# Patient Record
Sex: Female | Born: 1935
Health system: Southern US, Community
[De-identification: ages and names within clinical notes are randomized; demographics above are authoritative.]

## PROBLEM LIST (undated history)

## (undated) DIAGNOSIS — M1990 Unspecified osteoarthritis, unspecified site: Secondary | ICD-10-CM

## (undated) DIAGNOSIS — Z889 Allergy status to unspecified drugs, medicaments and biological substances status: Secondary | ICD-10-CM

## (undated) DIAGNOSIS — Z8719 Personal history of other diseases of the digestive system: Secondary | ICD-10-CM

## (undated) DIAGNOSIS — E049 Nontoxic goiter, unspecified: Secondary | ICD-10-CM

## (undated) DIAGNOSIS — K589 Irritable bowel syndrome without diarrhea: Secondary | ICD-10-CM

## (undated) HISTORY — PX: COLONOSCOPY W/ POLYPECTOMY: SHX1380

## (undated) HISTORY — PX: LEFT OOPHORECTOMY: SHX1961

## (undated) HISTORY — PX: ABDOMINAL HYSTERECTOMY: SHX81

---

## 1999-12-02 ENCOUNTER — Encounter: Payer: Self-pay | Admitting: Family Medicine

## 1999-12-02 ENCOUNTER — Emergency Department (HOSPITAL_COMMUNITY): Admission: EM | Admit: 1999-12-02 | Discharge: 1999-12-03 | Payer: Self-pay | Admitting: Emergency Medicine

## 2000-01-25 ENCOUNTER — Encounter: Payer: Self-pay | Admitting: Internal Medicine

## 2000-01-25 ENCOUNTER — Encounter: Admission: RE | Admit: 2000-01-25 | Discharge: 2000-01-25 | Payer: Self-pay | Admitting: Internal Medicine

## 2002-03-31 ENCOUNTER — Encounter: Payer: Self-pay | Admitting: Internal Medicine

## 2002-03-31 ENCOUNTER — Encounter: Admission: RE | Admit: 2002-03-31 | Discharge: 2002-03-31 | Payer: Self-pay | Admitting: Internal Medicine

## 2002-11-10 ENCOUNTER — Other Ambulatory Visit: Admission: RE | Admit: 2002-11-10 | Discharge: 2002-11-10 | Payer: Self-pay | Admitting: *Deleted

## 2003-07-10 ENCOUNTER — Ambulatory Visit (HOSPITAL_COMMUNITY): Admission: RE | Admit: 2003-07-10 | Discharge: 2003-07-10 | Payer: Self-pay | Admitting: Gastroenterology

## 2007-12-28 ENCOUNTER — Emergency Department (HOSPITAL_COMMUNITY): Admission: EM | Admit: 2007-12-28 | Discharge: 2007-12-28 | Payer: Self-pay | Admitting: Emergency Medicine

## 2008-06-05 ENCOUNTER — Encounter: Admission: RE | Admit: 2008-06-05 | Discharge: 2008-06-05 | Payer: Self-pay | Admitting: Internal Medicine

## 2008-06-15 ENCOUNTER — Other Ambulatory Visit: Admission: RE | Admit: 2008-06-15 | Discharge: 2008-06-15 | Payer: Self-pay | Admitting: Obstetrics and Gynecology

## 2008-07-28 ENCOUNTER — Encounter: Admission: RE | Admit: 2008-07-28 | Discharge: 2008-07-28 | Payer: Self-pay | Admitting: Obstetrics and Gynecology

## 2009-07-13 ENCOUNTER — Other Ambulatory Visit: Admission: RE | Admit: 2009-07-13 | Discharge: 2009-07-13 | Payer: Self-pay | Admitting: Obstetrics and Gynecology

## 2009-08-24 ENCOUNTER — Encounter: Admission: RE | Admit: 2009-08-24 | Discharge: 2009-08-24 | Payer: Self-pay | Admitting: Obstetrics and Gynecology

## 2010-05-30 ENCOUNTER — Emergency Department (HOSPITAL_COMMUNITY): Admission: EM | Admit: 2010-05-30 | Discharge: 2010-05-30 | Payer: Self-pay | Admitting: Emergency Medicine

## 2010-10-05 ENCOUNTER — Encounter: Admission: RE | Admit: 2010-10-05 | Discharge: 2010-10-05 | Payer: Self-pay | Admitting: Internal Medicine

## 2011-03-23 ENCOUNTER — Other Ambulatory Visit: Payer: Self-pay | Admitting: Internal Medicine

## 2011-03-23 ENCOUNTER — Ambulatory Visit
Admission: RE | Admit: 2011-03-23 | Discharge: 2011-03-23 | Disposition: A | Discharge status: Home / Self Care | Payer: Commercial Managed Care - PPO | Source: Ambulatory Visit | Attending: Internal Medicine | Admitting: Internal Medicine

## 2011-03-23 DIAGNOSIS — E041 Nontoxic single thyroid nodule: Secondary | ICD-10-CM

## 2011-10-16 ENCOUNTER — Other Ambulatory Visit: Payer: Self-pay | Admitting: Internal Medicine

## 2011-10-16 DIAGNOSIS — Z1231 Encounter for screening mammogram for malignant neoplasm of breast: Secondary | ICD-10-CM

## 2011-11-17 ENCOUNTER — Ambulatory Visit
Admission: RE | Admit: 2011-11-17 | Discharge: 2011-11-17 | Disposition: A | Discharge status: Home / Self Care | Payer: Commercial Managed Care - PPO | Source: Ambulatory Visit | Attending: Internal Medicine | Admitting: Internal Medicine

## 2011-11-17 DIAGNOSIS — Z1231 Encounter for screening mammogram for malignant neoplasm of breast: Secondary | ICD-10-CM

## 2012-05-07 DIAGNOSIS — E042 Nontoxic multinodular goiter: Secondary | ICD-10-CM | POA: Diagnosis not present

## 2012-05-09 DIAGNOSIS — E041 Nontoxic single thyroid nodule: Secondary | ICD-10-CM | POA: Diagnosis not present

## 2012-05-09 DIAGNOSIS — E042 Nontoxic multinodular goiter: Secondary | ICD-10-CM | POA: Diagnosis not present

## 2012-05-10 ENCOUNTER — Other Ambulatory Visit: Payer: Self-pay | Admitting: Endocrinology

## 2012-05-10 DIAGNOSIS — E049 Nontoxic goiter, unspecified: Secondary | ICD-10-CM

## 2012-05-21 ENCOUNTER — Ambulatory Visit
Admission: RE | Admit: 2012-05-21 | Discharge: 2012-05-21 | Disposition: A | Discharge status: Home / Self Care | Payer: 59 | Source: Ambulatory Visit | Attending: Endocrinology | Admitting: Endocrinology

## 2012-05-21 DIAGNOSIS — E049 Nontoxic goiter, unspecified: Secondary | ICD-10-CM

## 2012-06-04 ENCOUNTER — Ambulatory Visit (HOSPITAL_COMMUNITY)
Admission: RE | Admit: 2012-06-04 | Discharge: 2012-06-04 | Disposition: A | Discharge status: Home / Self Care | Payer: 59 | Source: Ambulatory Visit | Attending: Internal Medicine | Admitting: Internal Medicine

## 2012-06-04 DIAGNOSIS — M79609 Pain in unspecified limb: Secondary | ICD-10-CM | POA: Insufficient documentation

## 2012-06-04 DIAGNOSIS — R262 Difficulty in walking, not elsewhere classified: Secondary | ICD-10-CM | POA: Diagnosis not present

## 2012-06-04 DIAGNOSIS — IMO0001 Reserved for inherently not codable concepts without codable children: Secondary | ICD-10-CM | POA: Diagnosis not present

## 2012-06-04 DIAGNOSIS — R29898 Other symptoms and signs involving the musculoskeletal system: Secondary | ICD-10-CM | POA: Insufficient documentation

## 2012-06-04 DIAGNOSIS — M6281 Muscle weakness (generalized): Secondary | ICD-10-CM | POA: Insufficient documentation

## 2012-06-04 NOTE — Evaluation (Signed)
Physical Therapy Evaluation  Patient Details  Name: EILENE VOIGT MRN: [id number] Date of Birth: 1935-09-21  Today's Date: 06/04/2012 Time: 4098-1191 PT Time Calculation (min): 45 min  Visit#: 1  of 8   Re-eval: 07/04/12 Assessment Diagnosis: radicular back pain Prior Therapy: none  Authorization:  [employer] plus medicare  Authorization Time Period:    Authorization Visit#:  1 of  10   Past Medical History: No past medical history on file. Past Surgical History: No past surgical history on file.  Subjective Symptoms/Limitations Symptoms: Ms Starn states that she has been experiencing pain in her right which radiates down into her calf.  The patient states that the pain gets so bad that when it starts she has to pick her leg up and put it into the car.  She states that she can walk once she started but she is unable to go up or down steps.  The patient states the is intermittent and she does not know what triggers it.  She states that when she has an episode it might last three weeks without going away.  She is now on a mm relaxor and pain meds. How long can you sit comfortably?: When the pain occurs it does not matter if she is sitting, standing or lying she has the pain.  She just deals with the pain she will not sit any longer than an hour. How long can you stand comfortably?: The patient states that she just bares the pain. How long can you walk comfortably?: The patient states that she is only able to walk for 5-10 minutes at a time. Special Tests: Pt states she is having difficulty going up and down steps as well as in and out of a car. Patient Stated Goals: To have less pain and be able to move. Pain Assessment Currently in Pain?: No/denies (worst pain 10/10.  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 Leisure: Hobbies-no  Cognition/Observation Cognition Overall Cognitive Status: Appears within functional limits for tasks assessed  Sensation/Coordination/Flexibility/Functional Tests Functional Tests Functional Tests: Oswestry 60%  Assessment RLE Strength Right Hip Flexion: 2/5 Right Hip Extension: 3-/5 Right Hip ABduction: 5/5 Right Hip ADduction: 4/5 Right Knee Flexion: 3+/5 Right Knee Extension: 5/5 Right Ankle Dorsiflexion: 5/5 LLE Strength Left Hip Flexion: 5/5 Left Hip Extension: 3/5 Left Hip ABduction: 5/5 Left Hip ADduction: 4/5 Left Knee Flexion: 5/5 Left Knee Extension: 5/5 Left Ankle Dorsiflexion: 5/5 Lumbar AROM Lumbar Flexion: decreased lumbar motion but full Range due to hip (increased pain upon coming up.) Lumbar Extension: wnl- pt can feel groin when extended  Lumbar - Right Side Bend: increased groin pain  Lumbar - Left Side Bend: no groin pain. Lumbar - Right Rotation: wfl Lumbar - Left Rotation: wfl  Exercise/Treatments    Standing Other Standing Lumbar Exercises: L SB x 10 Seated Other Seated Lumbar Exercises: Kegal/transverse ab x 10 each    Physical Therapy Assessment and Plan PT Assessment and Plan Clinical Impression Statement: Pt with decreased strength and increase pain of R LE who will benefit from skilled PT to returne PT to previous functional level and improve quality of life. Pt will benefit from skilled  therapeutic intervention in order to improve on the following deficits: Pain;Impaired perceived functional ability;Decreased strength;Difficulty walking;Decreased activity tolerance Rehab Potential: Good PT Frequency: Min 2X/week PT Duration: 4 weeks PT Treatment/Interventions: Therapeutic activities;Therapeutic  exercise;Modalities PT Plan: Pt to be seen to improve core and LE strength as well as improve flexiblity of lumbar area.  Pt will begin stab ex with bent leg lift, bridge, clam and isometric hip flextion as well as begin pelvic tilt to impove lumbar mobility and ab strength.     Goals Home Exercise Program Pt will Perform Home Exercise Program: Independently PT Short Term Goals Time to Complete Short Term Goals: 2 weeks PT Short Term Goal 1: Pt pain to be no greater than a 6 PT Short Term Goal 2: Pt strength to be improved by 1/2 grade PT Short Term Goal 3: Pt to have no difficulty puttting leg into car on her own. PT Short Term Goal 4: Pt to states she can walk for an hour at a time without difficulty PT Long Term Goals Time to Complete Long Term Goals: 4 weeks PT Long Term Goal 1: I in advance HEP PT Long Term Goal 2: Pt strength to be increased by one grade Long Term Goal 3: Pt to be able to go up and down steps reciprocally Long Term Goal 4: Pt to state that she does not experience pain greater than a 3 in her groin/leg area. PT Long Term Goal 5: Pt to be able to sit for two hours for traveling, charting or going to the movies  Problem List Patient Active Problem List  Diagnosis  . Weakness of right leg  . Difficulty in walking    PT - End of Session Activity Tolerance: Patient tolerated treatment well General Behavior During Session: [facility] for tasks performed Cognition: [facility] for tasks performed PT Plan of Care PT Home Exercise Plan: given Consulted and Agree with Plan of Care: Patient  GP Functional Assessment Tool Used: Oswestry Functional Limitation: Mobility: Walking and moving around Mobility: Walking and Moving Around Current Status (Z6109): At least 60 percent but less than 80 percent impaired, limited or restricted Mobility: Walking and Moving Around Goal Status [phone]): At least 1 percent but less than 20 percent impaired, limited or  restricted  RUSSELL,CINDY 06/04/2012, 10:33 AM  Physician Documentation Your signature is required to indicate approval of the treatment plan as stated above.  Please sign and either send electronically or make a copy of this report for your files and return this physician signed original.   Please mark one 1.__approve of plan  2. ___approve of plan with the following conditions.   ______________________________                                                          _____________________ Physician Signature  Date  

## 2012-06-11 ENCOUNTER — Ambulatory Visit (HOSPITAL_COMMUNITY)
Admission: RE | Admit: 2012-06-11 | Discharge: 2012-06-11 | Disposition: A | Discharge status: Home / Self Care | Payer: 59 | Source: Ambulatory Visit | Attending: Internal Medicine | Admitting: Internal Medicine

## 2012-06-11 DIAGNOSIS — M6281 Muscle weakness (generalized): Secondary | ICD-10-CM | POA: Diagnosis not present

## 2012-06-11 DIAGNOSIS — IMO0001 Reserved for inherently not codable concepts without codable children: Secondary | ICD-10-CM | POA: Diagnosis not present

## 2012-06-11 DIAGNOSIS — R262 Difficulty in walking, not elsewhere classified: Secondary | ICD-10-CM | POA: Diagnosis not present

## 2012-06-11 DIAGNOSIS — M79609 Pain in unspecified limb: Secondary | ICD-10-CM | POA: Diagnosis not present

## 2012-06-11 NOTE — Progress Notes (Signed)
Physical Therapy Treatment Patient Details  Name: Danielle Oconnell MRN: [id number] Date of Birth: [birth year]  Today's Date: 06/11/2012 Time: 4098-1191 PT Time Calculation (min): 31 min 30' of therex  Visit#: 2  of 8   Re-eval:      Authorization:    Authorization Time Period:    Authorization Visit#:   of     Subjective: Symptoms/Limitations Symptoms: Pt reports 5/10 R groin area pain that radiates down anterior RLE to calf. No back pain today only "tired" feeling.  Precautions/Restrictions     Exercise/Treatments         Stretches Hip Flexor Stretch: 3 reps;30 seconds;Limitations (right)    Standing Other Standing Lumbar Exercises: L SB x 10 Other Standing Lumbar Exercises: R knee flex x10 hold 3" Seated Other Seated Lumbar Exercises: Kegal/transverse ab x 10 each Other Seated Lumbar Exercises: seated marching 1' w/ab set Supine Ab Set: 10 reps Bent Knee Raise: 5 reps (bilateral) Bridge: 10 reps Isometric Hip Flexion: 5 reps;5 seconds Other Supine Lumbar Exercises: posterior pelvic tilt Sidelying Clam: 10 reps;Limitations (bilateral) Prone  Straight Leg Raise: 10 reps (right)        Physical Therapy Assessment and Plan PT Assessment and Plan Clinical Impression Statement: Patient did well with added exercises; did require tactile cueing for some. Continue POC focusing on core stabilization and strengthening of RLE    Goals    Problem List Patient Active Problem List  Diagnosis  . Weakness of right leg  . Difficulty in walking    PT - End of Session Activity Tolerance: Patient tolerated treatment well General Behavior During Session: [facility] for tasks performed Cognition: [facility] for tasks performed  GP    Danielle Oconnell 06/11/2012, 8:39 AM

## 2012-06-13 ENCOUNTER — Inpatient Hospital Stay (HOSPITAL_COMMUNITY): Admission: RE | Admit: 2012-06-13 | Payer: 59 | Source: Ambulatory Visit | Admitting: Physical Therapy

## 2012-06-18 ENCOUNTER — Ambulatory Visit (HOSPITAL_COMMUNITY)
Admission: RE | Admit: 2012-06-18 | Discharge: 2012-06-18 | Disposition: A | Discharge status: Home / Self Care | Payer: 59 | Source: Ambulatory Visit | Attending: Internal Medicine | Admitting: Internal Medicine

## 2012-06-18 DIAGNOSIS — IMO0001 Reserved for inherently not codable concepts without codable children: Secondary | ICD-10-CM | POA: Diagnosis not present

## 2012-06-18 DIAGNOSIS — M6281 Muscle weakness (generalized): Secondary | ICD-10-CM | POA: Diagnosis not present

## 2012-06-18 DIAGNOSIS — M79609 Pain in unspecified limb: Secondary | ICD-10-CM | POA: Diagnosis not present

## 2012-06-18 DIAGNOSIS — R262 Difficulty in walking, not elsewhere classified: Secondary | ICD-10-CM | POA: Diagnosis not present

## 2012-06-18 NOTE — Progress Notes (Signed)
Physical Therapy Treatment Patient Details  Name: Danielle Oconnell MRN: [id number] Date of Birth: December 27, 1934  Today's Date: 06/18/2012 Time: 4098-1191 PT Time Calculation (min): 39 min  Visit#: 3  of 8   Re-eval: 07/04/12 Charges: Therex x 38'   Subjective: Symptoms/Limitations Symptoms: Pt reports 5/10 radicualr sx in R hip and groin. Pain Assessment Currently in Pain?: Yes Pain Score:   5 Pain Location: Hip Pain Orientation: Right   Exercise/Treatments Stretches Active Hamstring Stretch: 2 reps;30 seconds Lower Trunk Rotation: 5 reps;10 seconds Standing Other Standing Lumbar Exercises: L SB x 10 Supine Ab Set: 10 reps;5 seconds Bent Knee Raise: 10 reps Bridge: 10 reps Isometric Hip Flexion: 10 reps;5 seconds Other Supine Lumbar Exercises: Pelvic floor contraction 10x5" Sidelying Clam: 5 reps;Limitations (10") Prone  Straight Leg Raise: 10 reps Other Prone Lumbar Exercises: Glute set 10x5"  Physical Therapy Assessment and Plan PT Assessment and Plan Clinical Impression Statement: Pt completes therex well after cueing for technique. Began new stretches to improve lumbar and LE flexibility. Pt has most difficulty with hip extensor exercises secondary to weakness. Pt reports no change in pain at end of session. PT Plan: Continue to progress core stability and flexibility per PT POC.     Problem List Patient Active Problem List  Diagnosis  . Weakness of right leg  . Difficulty in walking    PT - End of Session Activity Tolerance: Patient tolerated treatment well General Behavior During Session: [facility] for tasks performed Cognition: [facility] for tasks performed   Seth Bake, PTA 06/18/2012, 9:02 AM

## 2012-06-20 ENCOUNTER — Ambulatory Visit (HOSPITAL_COMMUNITY)
Admission: RE | Admit: 2012-06-20 | Discharge: 2012-06-20 | Disposition: A | Discharge status: Home / Self Care | Payer: 59 | Source: Ambulatory Visit | Attending: Internal Medicine | Admitting: Internal Medicine

## 2012-06-20 DIAGNOSIS — M79609 Pain in unspecified limb: Secondary | ICD-10-CM | POA: Diagnosis not present

## 2012-06-20 DIAGNOSIS — M6281 Muscle weakness (generalized): Secondary | ICD-10-CM | POA: Insufficient documentation

## 2012-06-20 DIAGNOSIS — IMO0001 Reserved for inherently not codable concepts without codable children: Secondary | ICD-10-CM | POA: Insufficient documentation

## 2012-06-20 DIAGNOSIS — R262 Difficulty in walking, not elsewhere classified: Secondary | ICD-10-CM | POA: Diagnosis not present

## 2012-06-20 NOTE — Progress Notes (Signed)
Physical Therapy Treatment Patient Details  Name: Danielle Oconnell MRN: [id number] Date of Birth: 1935/04/01  Today's Date: 06/20/2012 Time: 0865-7846 PT Time Calculation (min): 35 min Visit#: 4  of 8   Re-eval: 07/04/12 Charges:  therex 32'    Subjective: Symptoms/Limitations Symptoms: Pt. states she is having less pain today, 3/10, mostly into R hip/groin area.  Just got off from working 3rd shift, 8 hours. Pain Assessment Currently in Pain?: Yes Pain Score:   3 Pain Location: Hip Pain Orientation: Right   Exercise/Treatments Stretches Active Hamstring Stretch: 2 reps;30 seconds Standing Other Standing Lumbar Exercises: L SB x 10 Supine Ab Set: 15 reps;5 seconds Bent Knee Raise: 10 reps Bridge: 10 reps Straight Leg Raise: 5 reps Isometric Hip Flexion: 10 reps;5 seconds Other Supine Lumbar Exercises: Pelvic floor contraction 10x10" Sidelying Clam: 5 reps;Limitations Clam Limitations: 10 sec holds Prone  Straight Leg Raise: 10 reps Other Prone Lumbar Exercises: Glute set 10x5"    Physical Therapy Assessment and Plan PT Assessment and Plan Clinical Impression Statement: Noted weakness in R LE with supine and prone SLRs and bridge with decreased ROM achieved.  Able to increase hold time of kegals to 10 seconds.  Pt. with good stability while performing exercises. PT Plan: Continue to progress core strength and flexibility.     Problem List Patient Active Problem List  Diagnosis  . Weakness of right leg  . Difficulty in walking    PT - End of Session Activity Tolerance: Patient tolerated treatment well General Behavior During Session: [facility] for tasks performed Cognition: [facility] for tasks performed   Lurena Nida, PTA/CLT 06/20/2012, 8:59 AM

## 2012-06-25 ENCOUNTER — Ambulatory Visit (HOSPITAL_COMMUNITY)
Admission: RE | Admit: 2012-06-25 | Discharge: 2012-06-25 | Disposition: A | Discharge status: Home / Self Care | Payer: 59 | Source: Ambulatory Visit | Attending: Internal Medicine | Admitting: Internal Medicine

## 2012-06-25 DIAGNOSIS — M6281 Muscle weakness (generalized): Secondary | ICD-10-CM | POA: Diagnosis not present

## 2012-06-25 DIAGNOSIS — M79609 Pain in unspecified limb: Secondary | ICD-10-CM | POA: Diagnosis not present

## 2012-06-25 DIAGNOSIS — IMO0001 Reserved for inherently not codable concepts without codable children: Secondary | ICD-10-CM | POA: Diagnosis not present

## 2012-06-25 DIAGNOSIS — R262 Difficulty in walking, not elsewhere classified: Secondary | ICD-10-CM | POA: Diagnosis not present

## 2012-06-25 NOTE — Progress Notes (Signed)
Physical Therapy Treatment Patient Details  Name: Danielle Oconnell MRN: [id number] Date of Birth: May 18, 1935  Today's Date: 06/25/2012 Time: 0803-0840 PT Time Calculation (min): 37 min 36' therex   Visit#: 5  of 8   Re-eval: 07/04/12    Authorization:    Authorization Time Period:    Authorization Visit#:   of     Subjective: Symptoms/Limitations Symptoms: Patient reports a "tightness" to R hip/groin area today, "not really pain"  Precautions/Restrictions     Exercise/Treatments         Stretches Active Hamstring Stretch: 3 reps;30 seconds;Other (comment) Lower Trunk Rotation: 5 reps;10 seconds Hip Flexor Stretch: 3 reps;30 seconds;Limitations    Standing Other Standing Lumbar Exercises: L SB x 15 Seated   Supine Ab Set: 15 reps;5 seconds Bent Knee Raise: 10 reps Bridge: 15 reps Straight Leg Raise: 10 reps Isometric Hip Flexion: 10 reps;5 seconds Other Supine Lumbar Exercises: Pelvic floor contraction 10x10" Other Supine Lumbar Exercises: isometric adduction sqeeze w/ball x10 for 5" Sidelying Clam: 5 reps;Limitations Clam Limitations: 10 sec holds Hip Abduction: 10 reps;Other (comment) (bilateral) Prone  Single Arm Raise: 10 reps Straight Leg Raise: 10 reps Other Prone Lumbar Exercises: Glute set 10x5" Quadruped       Physical Therapy Assessment and Plan PT Assessment and Plan Clinical Impression Statement: Patient performs most exercises without difficulty; however prone LLE today was weaker than normal in prone. Continue to strengthen abdominals and hip adduction, Continue with POC;add bal sqeeeze to bridges and prone opposite arm/leg next  PT Plan: Continue with PT POC    Goals    Problem List Patient Active Problem List  Diagnosis  . Weakness of right leg  . Difficulty in walking    PT - End of Session Activity Tolerance: Patient tolerated treatment well General Behavior During Session: [facility] for tasks performed Cognition: [facility] for tasks  performed  GP    Cherron Blitzer ATKINSO 06/25/2012, 8:45 AM

## 2012-06-27 ENCOUNTER — Ambulatory Visit (HOSPITAL_COMMUNITY)
Admission: RE | Admit: 2012-06-27 | Discharge: 2012-06-27 | Disposition: A | Discharge status: Home / Self Care | Payer: 59 | Source: Ambulatory Visit | Attending: Internal Medicine | Admitting: Internal Medicine

## 2012-06-27 DIAGNOSIS — M79609 Pain in unspecified limb: Secondary | ICD-10-CM | POA: Diagnosis not present

## 2012-06-27 DIAGNOSIS — IMO0001 Reserved for inherently not codable concepts without codable children: Secondary | ICD-10-CM | POA: Diagnosis not present

## 2012-06-27 DIAGNOSIS — M6281 Muscle weakness (generalized): Secondary | ICD-10-CM | POA: Diagnosis not present

## 2012-06-27 DIAGNOSIS — R262 Difficulty in walking, not elsewhere classified: Secondary | ICD-10-CM | POA: Diagnosis not present

## 2012-06-27 NOTE — Progress Notes (Signed)
Physical Therapy Treatment Patient Details  Name: Danielle Oconnell MRN: [id number] Date of Birth: Jul 05, 1935  Today's Date: 06/27/2012 Time: 8295-6213 PT Time Calculation (min): 41 min  Visit#: 6  of 10   Re-eval: 07/04/12    Subjective: Symptoms/Limitations Symptoms: Pt has not started a walking program but she is doing her exercises. Pain Assessment Currently in Pain?: Yes Pain Score:   3 Pain Location: Calf Pain Orientation: Right    Exercise/Treatments   Stretches Active Hamstring Stretch: 3 reps;30 seconds Single Knee to Chest Stretch: 3 reps;30 seconds Lower Trunk Rotation: 5 reps Aerobic Tread Mill: 1.6 mph x 6'    Standing Scapular Retraction: Strengthening;10 reps;Theraband Row: Strengthening;10 reps;Theraband Theraband Level (Row): Level 3 (Green) Shoulder Extension: Strengthening;Right;10 reps Seated   Supine Dead Bug: 10 reps Bridge: 15 reps;Limitations Bridge Limitations: with ball squeeze. Straight Leg Raise: 10 reps Sidelying Hip Abduction: 10 reps Prone  Straight Leg Raise: 10 reps Opposite Arm/Leg Raise: 10 reps Other Prone Lumbar Exercises: heel squeeze x 10     Physical Therapy Assessment and Plan PT Assessment and Plan Clinical Impression Statement: Pt needs both verbal and physical cuing for proper stabilization.  Pt  added T-band, ball bridges, increased slr to floating slr and opposite arm/leg raise.  Encouraged walking program for healthy lifesyle PT Plan: Work with stabilization technique so pt can perform exercises correctly at home.      Problem List Patient Active Problem List  Diagnosis  . Weakness of right leg  . Difficulty in walking       RUSSELL,CINDY 06/27/2012, 8:46 AM

## 2012-07-02 ENCOUNTER — Ambulatory Visit (HOSPITAL_COMMUNITY): Payer: 59 | Admitting: *Deleted

## 2012-07-04 ENCOUNTER — Ambulatory Visit (HOSPITAL_COMMUNITY): Payer: 59 | Admitting: Physical Therapy

## 2012-07-09 ENCOUNTER — Ambulatory Visit (HOSPITAL_COMMUNITY)
Admission: RE | Admit: 2012-07-09 | Discharge: 2012-07-09 | Disposition: A | Discharge status: Home / Self Care | Payer: 59 | Source: Ambulatory Visit | Attending: Internal Medicine | Admitting: Internal Medicine

## 2012-07-09 DIAGNOSIS — M6281 Muscle weakness (generalized): Secondary | ICD-10-CM | POA: Diagnosis not present

## 2012-07-09 DIAGNOSIS — R262 Difficulty in walking, not elsewhere classified: Secondary | ICD-10-CM | POA: Diagnosis not present

## 2012-07-09 DIAGNOSIS — M79609 Pain in unspecified limb: Secondary | ICD-10-CM | POA: Diagnosis not present

## 2012-07-09 DIAGNOSIS — IMO0001 Reserved for inherently not codable concepts without codable children: Secondary | ICD-10-CM | POA: Diagnosis not present

## 2012-07-09 NOTE — Progress Notes (Signed)
Physical Therapy Treatment Patient Details  Name: Danielle Oconnell MRN: [id number] Date of Birth: [birth year]  Today's Date: 07/09/2012 Time: 0800-0844 PT Time Calculation (min): 44 min Visit#: 7  of 8   Re-eval: 07/11/12 Charges:  therex 38'    Subjective: Symptoms/Limitations Symptoms: Pt. states she has not started a walking program yet secondary to being on vacation last week. Reports she drove to [place] and did not have any discomfort in her LE's, just a little pressure (not pain) in her LB.  Currently, states her pain is in her central LB and is more like a pressure than a pain. No pain into her LE's. Pain Assessment Currently in Pain?: No/denies   Exercise/Treatments Stretches Active Hamstring Stretch: 3 reps;30 seconds Single Knee to Chest Stretch: 3 reps;30 seconds Lower Trunk Rotation: 5 reps Aerobic Tread Mill: 1.6 mph x 6' Standing Scapular Retraction: 10 reps;Theraband Theraband Level (Scapular Retraction): Level 3 (Green) Row: 10 reps;Theraband Theraband Level (Row): Level 3 (Green) Shoulder Extension: 10 reps;Theraband Theraband Level (Shoulder Extension): Level 3 (Green) Other Standing Lumbar Exercises: L SB x 15 Supine Dead Bug: 10 reps Bridge: Limitations;10 reps Bridge Limitations: feet on big ball Straight Leg Raise: 10 reps Sidelying Hip Abduction: 10 reps Prone  Single Arm Raise: 10 reps;Limitations Single Arm Raises Limitations: alternating Straight Leg Raise: 10 reps;Limitations Straight Leg Raises Limitations: alternating Opposite Arm/Leg Raise: 10 reps Other Prone Lumbar Exercises: heel squeeze x 10     Physical Therapy Assessment and Plan PT Assessment and Plan Clinical Impression Statement: Overall, centralizing pain with improvement. Pt. continued to get cramps into her L hamstring during therex today but able to subside with stretching. Continues to display poor stabilization with therex, requiring verbal and tactile cuing.  Progressed  bridge with feet on physioball.     Problem List Patient Active Problem List  Diagnosis  . Weakness of right leg  . Difficulty in walking    PT - End of Session Activity Tolerance: Patient tolerated treatment well General Behavior During Session: [facility] for tasks performed Cognition: [facility] for tasks performed PT Plan of Care PT Home Exercise Plan: Re-evaluate next visit.   Lurena Nida, PTA/CLT 07/09/2012, 8:49 AM

## 2012-07-11 ENCOUNTER — Ambulatory Visit (HOSPITAL_COMMUNITY)
Admission: RE | Admit: 2012-07-11 | Discharge: 2012-07-11 | Disposition: A | Discharge status: Home / Self Care | Payer: 59 | Source: Ambulatory Visit | Attending: Internal Medicine | Admitting: Internal Medicine

## 2012-07-11 DIAGNOSIS — M79609 Pain in unspecified limb: Secondary | ICD-10-CM | POA: Diagnosis not present

## 2012-07-11 DIAGNOSIS — R29898 Other symptoms and signs involving the musculoskeletal system: Secondary | ICD-10-CM

## 2012-07-11 DIAGNOSIS — IMO0001 Reserved for inherently not codable concepts without codable children: Secondary | ICD-10-CM | POA: Diagnosis not present

## 2012-07-11 DIAGNOSIS — M6281 Muscle weakness (generalized): Secondary | ICD-10-CM | POA: Diagnosis not present

## 2012-07-11 DIAGNOSIS — R262 Difficulty in walking, not elsewhere classified: Secondary | ICD-10-CM

## 2012-07-11 NOTE — Evaluation (Signed)
Physical Therapy Evaluation  Patient Details  Name: Danielle Oconnell MRN: [id number] Date of Birth: May 19, 1935  Today's Date: 07/11/2012 Time: 4696-2952 PT Time Calculation (min): 43 min  Visit#: 8  of 12   Re-eval: 07/11/12 Assessment Diagnosis: radicular back pain Prior Therapy: none  Authorization: medicare secondary;  Change G code to CI at 10th visit based on Oswestry score.  Authorization Time Period:    Authorization Visit#: 8  of 12    Past Medical History: No past medical history on file. Past Surgical History: No past surgical history on file.  Subjective Symptoms/Limitations Symptoms: Pt states that the pain in her leg is much better.  She can still tell the leg is weak.  The patient has been doing her HEP How long can you sit comfortably?: The patient states that she is able to sit comfortable for an hour.  This has not changed. How long can you stand comfortably?: The patient states that she is much more comfortable standing now.  Able to stand for 30 min.  Prior to therapy pt just bared the pain. How long can you walk comfortably?: The patient is able to walk for 30 minutes. She was able to walk 5 to 10 minutes. Special Tests: The patient states that she is doing steps much better but knows she still has weakness. 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 Leisure:  Hobbies-no  Cognition/Observation Cognition Overall Cognitive Status: Appears within functional limits for tasks assessed  Sensation/Coordination/Flexibility/Functional Tests Functional Tests Functional Tests: Pennie Rushing was 62  Assessment RLE Strength Right Hip Flexion: 3+/5 (was 2/5) Right Hip Extension: 3-/5 (was 3-/5) Right Hip ABduction: 5/5 Right Hip ADduction: 5/5 Right Knee Flexion: 4/5 (was 3+/5) Right Knee Extension: 5/5 Right Ankle Dorsiflexion: 5/5 LLE Strength Left Hip Flexion: 5/5 Left Hip Extension: 3/5 (was 3/5) Left Hip ABduction: 5/5 Left Hip ADduction: 5/5 Left Knee Flexion: 5/5 Left Knee Extension: 5/5 Left Ankle Dorsiflexion: 5/5 Lumbar AROM Lumbar Flexion:  (improved lumbar motion wfl) Lumbar Extension: wfl Lumbar - Right Side Bend: wfl Lumbar - Left Side Bend: wfl Lumbar - Right Rotation: wfl Lumbar - Left Rotation: wfl   There ex:  T-band x 15 reps with HEP   Physical Therapy Assessment and Plan PT Assessment and Plan Clinical Impression Statement: Pt improved significantly with Oswestry going from 60 to 13 but still has noted weakness and pain.  will continue stab and stretches to maximize pt functional improvement. Rehab Potential: Good PT Frequency: Min 1X/week PT Duration: 4 weeks PT Plan: Pt needs to focus on strengthening of R hip flex;B hip extensors and R hamstring;  Begin lateral and forward with good stabiliztion as well as bridge w/legs on physioball;  prone IR/ER; SLR, SAR opposite Arm/leg raise.      Goals Home Exercise Program PT Goal: Perform Home Exercise Program - Progress: Met PT Short Term Goals PT Short Term Goal 1 - Progress:  Met PT Short Term Goal 2: goal is for mm to be strengthened by 1/2 grade.  Met by all weakened mm except hip extension B PT Short Term Goal 2 - Progress: Partly met PT Short Term Goal 3: Goal was for pt to have no difficulty getting into car.  Better but there is still weakness PT Short Term Goal 3  - Progress: Progressing toward goal PT Short Term Goal 4: goal :  Pt to be able to walk for an hour PT Short Term Goal 4 - Progress: Met PT Long Term Goals PT Long Term Goal 1 - Progress: Met PT Long Term Goal 2 - Progress: Partly met Long Term Goal 3: Pt is able to go up and down steps reciprocally with hand holding onto rail and noted weakness in LE Long Term Goal 3 Progress: Progressing toward goal Long Term Goal 4: Goal was for groin pain to be no greater than a 3/10 current 4/10 Long Term Goal 4 Progress: Progressing toward goal PT Long Term Goal 5: Goal was to be able to sit for two hours for traveling;  Goal met Long Term Goal 5 Progress: Met  Problem List Patient Active Problem List  Diagnosis  . Weakness of right leg  . Difficulty in walking    PT - End of Session Activity Tolerance: Patient tolerated treatment well General Behavior During Session: [facility] for tasks performed Cognition: [facility] for tasks performed PT Plan of Care PT Home Exercise Plan: advance HEP given Consulted and Agree with Plan of Care: Patient  GP Functional Assessment Tool Used: Soundra Pilon 07/11/2012, 9:44 AM  Physician Documentation Your signature is required to indicate approval of the treatment plan as stated above.  Please sign and either send electronically or make a copy of this report for your files and return this physician signed original.   Please mark one 1.__approve of plan  2. ___approve of plan with the following conditions.   ______________________________                                                          _____________________ Physician Signature                                                                                                             Date

## 2012-07-16 ENCOUNTER — Ambulatory Visit (HOSPITAL_COMMUNITY)
Admission: RE | Admit: 2012-07-16 | Discharge: 2012-07-16 | Disposition: A | Discharge status: Home / Self Care | Payer: 59 | Source: Ambulatory Visit | Attending: Internal Medicine | Admitting: Internal Medicine

## 2012-07-16 DIAGNOSIS — IMO0001 Reserved for inherently not codable concepts without codable children: Secondary | ICD-10-CM | POA: Diagnosis not present

## 2012-07-16 DIAGNOSIS — M79609 Pain in unspecified limb: Secondary | ICD-10-CM | POA: Diagnosis not present

## 2012-07-16 DIAGNOSIS — M6281 Muscle weakness (generalized): Secondary | ICD-10-CM | POA: Diagnosis not present

## 2012-07-16 DIAGNOSIS — R262 Difficulty in walking, not elsewhere classified: Secondary | ICD-10-CM | POA: Diagnosis not present

## 2012-07-16 NOTE — Progress Notes (Signed)
Physical Therapy Treatment Patient Details  Name: Danielle Oconnell MRN: [id number] Date of Birth: Nov 26, 1934  Today's Date: 07/16/2012 Time: 4098-1191 PT Time Calculation (min): 39 min Visit#: 9  of 12   Re-eval: 07/25/12 Charges:  therex 38'   Subjective: Symptoms/Limitations Symptoms: Pt. states she is doing better overall.  Reports worked all night and no pain today. Pain Assessment Currently in Pain?: No/denies   Exercise/Treatments Stretches Active Hamstring Stretch: 3 reps;30 seconds Aerobic Tread Mill: 2.0 mph X 6' Standing Other Standing Lumbar Exercises: B hip ext. 10 reps, R hip flexion 10 reps 5" holds Other Standing Lumbar Exercises: lateral/fwd step ups B LE's 10 reps each with stab Supine Bridge: 10 reps Bridge Limitations: feet on physioball Straight Leg Raise: 10 reps;Limitations Straight Leg Raises Limitations: R only, slowly Isometric Hip Flexion: 10 reps;5 seconds Prone  Single Arm Raise: 10 reps;Limitations Single Arm Raises Limitations: alternating Straight Leg Raise: 10 reps;Limitations Straight Leg Raises Limitations: alternating Opposite Arm/Leg Raise: 10 reps Other Prone Lumbar Exercises: heel squeeze x 10 Other Prone Lumbar Exercises: R hamstring curl 10 reps 4#     Physical Therapy Assessment and Plan PT Assessment and Plan Clinical Impression Statement: Pt. required cues for stabilization with all exercises.  Added standing hip extension and R hip flexion without difficulty.  Pt. required tactile cues for stabilization with prone hamstring curls due to weakness.  Limited ROM with SLR due to weakness. PT Plan: Continue to focus on strengthening B hip extensors, R hip flex/hamstring.  Add prone hip IR/ER with stab next visit and update G-Code.  (medicare is secondary).   Problem List Patient Active Problem List  Diagnosis  . Weakness of right leg  . Difficulty in walking    PT - End of Session Activity Tolerance: Patient tolerated  treatment well General Behavior During Session: [facility] for tasks performed Cognition: [facility] for tasks performed   Lurena Nida, PTA/CLT 07/16/2012, 8:48 AM

## 2012-07-23 ENCOUNTER — Ambulatory Visit (HOSPITAL_COMMUNITY)
Admission: RE | Admit: 2012-07-23 | Discharge: 2012-07-23 | Disposition: A | Discharge status: Home / Self Care | Payer: 59 | Source: Ambulatory Visit | Attending: Internal Medicine | Admitting: Internal Medicine

## 2012-07-23 DIAGNOSIS — M79609 Pain in unspecified limb: Secondary | ICD-10-CM | POA: Insufficient documentation

## 2012-07-23 DIAGNOSIS — R262 Difficulty in walking, not elsewhere classified: Secondary | ICD-10-CM | POA: Insufficient documentation

## 2012-07-23 DIAGNOSIS — IMO0001 Reserved for inherently not codable concepts without codable children: Secondary | ICD-10-CM | POA: Insufficient documentation

## 2012-07-23 DIAGNOSIS — M6281 Muscle weakness (generalized): Secondary | ICD-10-CM | POA: Insufficient documentation

## 2012-07-23 DIAGNOSIS — R29898 Other symptoms and signs involving the musculoskeletal system: Secondary | ICD-10-CM

## 2012-07-23 NOTE — Progress Notes (Signed)
Physical Therapy Treatment Patient Details  Name: Danielle Oconnell MRN: [id number] Date of Birth: 1935/04/18  Today's Date: 07/23/2012 Time: 0802-0843 PT Time Calculation (min): 41 min  Visit#: 10  of 12   Re-eval: 07/25/12    Authorization:    Authorization Time Period:    Authorization Visit#: 10  of 12    Subjective: Symptoms/Limitations Symptoms: Pt states more difficulty with housework than work .  States she has pain in the medial aspect of L LE to L knee Pain Assessment Pain Score:   2    Exercise/Treatments Tread Mill: 2.0 x 6' [employer] for [employer] Lunge: 10 reps Side Lunge: 10 reps Other Standing Lumbar Exercises: step up x 10 on 6" step Supine Bridge: 10 reps Bridge Limitations: feet on ball Other Supine Lumbar Exercises: Ball ab squeeze lifting R LE then L x 10 Prone  Other Prone Lumbar Exercises: IR/ER x 10 @ Quadruped Straight Leg Raise: 5 reps     Physical Therapy Assessment and Plan PT Assessment and Plan Clinical Impression Statement: Pt added several new exercises to improve functional mobilty at home with housework.  Pt needed verbal and physcial cues to perfom ex correctly. Rehab Potential: Good PT Frequency: Min 1X/week PT Plan: two more visits reveiw new ex begin double ab set with ball; sitting stab on ball    Goals    Problem List Patient Active Problem List  Diagnosis  . Weakness of right leg  . Difficulty in walking    PT - End of Session Activity Tolerance: Patient tolerated treatment well General Behavior During Session: [facility] for tasks performed PT Plan of Care PT Home Exercise Plan: given new program  GP Functional Assessment Tool Used: Oswestry Functional Limitation: Mobility: Walking and moving around Mobility: Walking and Moving Around Current Status (W0981): At least 1 percent but less than 20 percent impaired, limited or restricted Mobility: Walking and Moving Around Goal Status [id number]): At  least 1 percent but less than 20 percent impaired, limited or restricted  Dennis Hegeman,CINDY 07/23/2012, 8:54 AM

## 2012-07-25 ENCOUNTER — Ambulatory Visit (HOSPITAL_COMMUNITY): Payer: 59 | Admitting: Physical Therapy

## 2012-07-30 ENCOUNTER — Ambulatory Visit (HOSPITAL_COMMUNITY)
Admission: RE | Admit: 2012-07-30 | Discharge: 2012-07-30 | Disposition: A | Discharge status: Home / Self Care | Payer: 59 | Source: Ambulatory Visit | Attending: Internal Medicine | Admitting: Internal Medicine

## 2012-07-30 DIAGNOSIS — R29898 Other symptoms and signs involving the musculoskeletal system: Secondary | ICD-10-CM

## 2012-07-30 DIAGNOSIS — R262 Difficulty in walking, not elsewhere classified: Secondary | ICD-10-CM

## 2012-07-30 NOTE — Evaluation (Cosign Needed)
Physical Therapy Evaluation  Patient Details  Name: NIJAE ATWAL MRN: [id number] Date of Birth: [birth year]  Today's Date: 07/30/2012 Time: 0802-0830 PT Time Calculation (min): 28 min  Visit#: 11  of 11   Re-eval:   Assessment Diagnosis: radicular back pain Prior Therapy: none  Authorization: medicare  Authorization Time Period:    Authorization Visit#: 11  of 11    Past Medical History: No past medical history on file. Past Surgical History: No past surgical history on file.  Subjective Symptoms/Limitations Symptoms: Ms. Werth states that this past wek has been good she has not had pain down her leg. How long can you sit comfortably?: The patient was able to sit through a movie now was only an hour.  How long can you stand comfortably?: Standing is alright now. How long can you walk comfortably?: The patient is walking four times a week for 30 minutes. Pain Assessment Currently in Pain?: No/denies Pain Score: 0-No pain     Prior Functioning  Home Living Lives With: Alone Type of Home: Apartment Home Access: Stairs to enter Prior Function Level of Independence: Independent with basic ADLs Able to Take Stairs?: Reciprically Driving: Yes Vocation: Full time employment Vocation Requirements: night RN Leisure: Hobbies-no  Cognition/Observation Cognition Overall Cognitive Status: Appears within functional limits for tasks assessed  Sensation/Coordination/Flexibility/Functional Tests Functional Tests Functional Tests: Oswestry was 16 on 8/22 now 5  Assessment RLE Strength Right Hip Flexion: 5/5 (was 3+/5 on 8/22) Right Hip Extension: 4/5 (was 3-/5) Right Hip ABduction: 5/5 Right Hip ADduction: 5/5 Right Knee Flexion: 5/5 (was 3+/5) Right Knee Extension: 5/5 Right Ankle Dorsiflexion: 5/5 LLE Strength Left Hip Flexion: 5/5 Left Hip Extension: 3+/5 (was 3/5) Left Hip ABduction: 5/5 Left Hip ADduction: 5/5 Left Knee Flexion: 5/5 Left Knee Extension:  5/5 Left Ankle Dorsiflexion: 5/5 Lumbar AROM Lumbar Flexion:  (improved lumbar motion wfl) Lumbar Extension: wfl Lumbar - Right Side Bend: wfl Lumbar - Left Side Bend: wfl Lumbar - Right Rotation: wfl Lumbar - Left Rotation: wfl  Exercise/Treatments Pt shown forearm plank exercise.  Spoke to pt about benefits of walking and the importance of keeping this up even though pain has gone away.     Physical Therapy Assessment and Plan PT Assessment and Plan Clinical Impression Statement: Pt has met all goals. Oswestry has came down from initial visit of a 60 to 10% percieved disability. PT Plan: D/C    Goals Home Exercise Program PT Goal: Perform Home Exercise Program - Progress: Met PT Short Term Goals PT Short Term Goal 1 - Progress: Met PT Short Term Goal 2 - Progress: Met PT Short Term Goal 3 - Progress: Met PT Short Term Goal 4 - Progress: Met PT Long Term Goals PT Long Term Goal 1 - Progress: Met PT Long Term Goal 2 - Progress: Met Long Term Goal 3 Progress: Met Long Term Goal 4 Progress: Met Long Term Goal 5 Progress: Met  Problem List Patient Active Problem List  Diagnosis  . Weakness of right leg  . Difficulty in walking    PT - End of Session Activity Tolerance: Patient tolerated treatment well General Behavior During Session: [facility] for tasks performed PT Plan of Care PT Home Exercise Plan: given new ex concentrating on hip extensors  GP Functional Assessment Tool Used: Oswestry now is at a 5/50 was 8 Functional Limitation: Mobility: Walking and moving around Mobility: Walking and Moving Around Goal Status [phone]): At least 1 percent but less than 20 percent impaired, limited or  restricted Mobility: Walking and Moving Around Discharge Status [phone]): At least 1 percent but less than 20 percent impaired, limited or restricted  RUSSELL,CINDY 07/30/2012, 8:34 AM  Physician Documentation Your signature is required to indicate approval of the treatment plan as  stated above.  Please sign and either send electronically or make a copy of this report for your files and return this physician signed original.   Please mark one 1.__approve of plan  2. ___approve of plan with the following conditions.   ______________________________                                                          _____________________ Physician Signature                                                                                                             Date

## 2012-08-22 DIAGNOSIS — L299 Pruritus, unspecified: Secondary | ICD-10-CM | POA: Diagnosis not present

## 2012-08-22 DIAGNOSIS — R209 Unspecified disturbances of skin sensation: Secondary | ICD-10-CM | POA: Diagnosis not present

## 2012-10-28 ENCOUNTER — Other Ambulatory Visit: Payer: Self-pay | Admitting: Internal Medicine

## 2012-10-28 DIAGNOSIS — Z1231 Encounter for screening mammogram for malignant neoplasm of breast: Secondary | ICD-10-CM

## 2012-11-18 ENCOUNTER — Ambulatory Visit
Admission: RE | Admit: 2012-11-18 | Discharge: 2012-11-18 | Disposition: A | Discharge status: Home / Self Care | Payer: 59 | Source: Ambulatory Visit | Attending: Internal Medicine | Admitting: Internal Medicine

## 2012-11-18 DIAGNOSIS — Z1231 Encounter for screening mammogram for malignant neoplasm of breast: Secondary | ICD-10-CM

## 2012-12-31 DIAGNOSIS — M1710 Unilateral primary osteoarthritis, unspecified knee: Secondary | ICD-10-CM | POA: Diagnosis not present

## 2012-12-31 DIAGNOSIS — IMO0002 Reserved for concepts with insufficient information to code with codable children: Secondary | ICD-10-CM | POA: Diagnosis not present

## 2013-01-28 DIAGNOSIS — IMO0002 Reserved for concepts with insufficient information to code with codable children: Secondary | ICD-10-CM | POA: Diagnosis not present

## 2013-01-28 DIAGNOSIS — M1710 Unilateral primary osteoarthritis, unspecified knee: Secondary | ICD-10-CM | POA: Diagnosis not present

## 2013-02-23 IMAGING — US US SOFT TISSUE HEAD/NECK
1 series · 14 of 25 positions shown · non-contrast
Comparison: Ultrasound of the thyroid of 03/23/2011

CLINICAL DATA: Follow up of thyroid goiter

THYROID ULTRASOUND
TECHNIQUE: Ultrasound examination of the thyroid gland and adjacent
soft tissues was performed.

[Series 1: us soft tissue head/neck · 0.04mm/px · 14 of 51 slices shown]
[im 1/51]
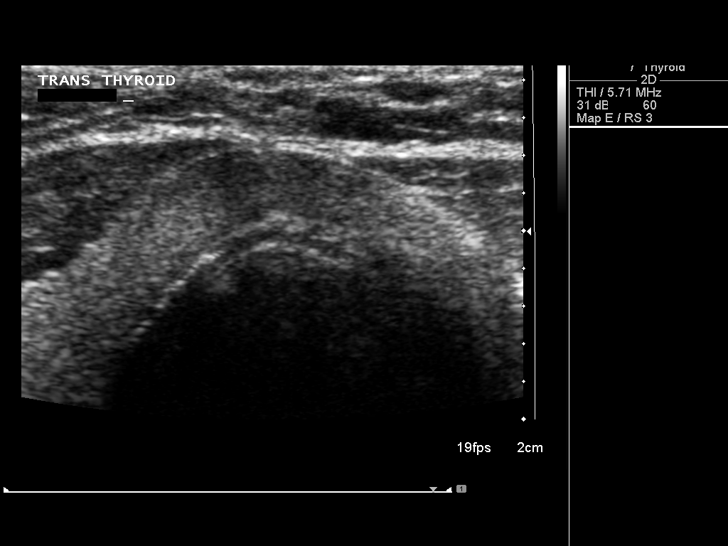
[im 5/51]
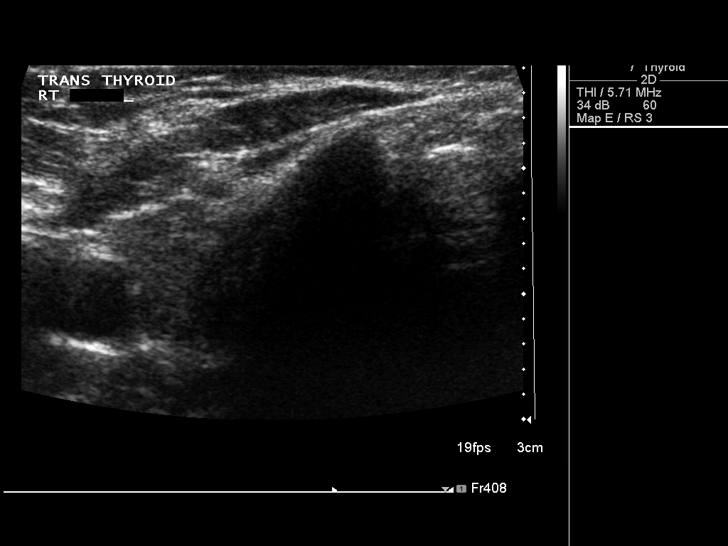
[im 9/51]
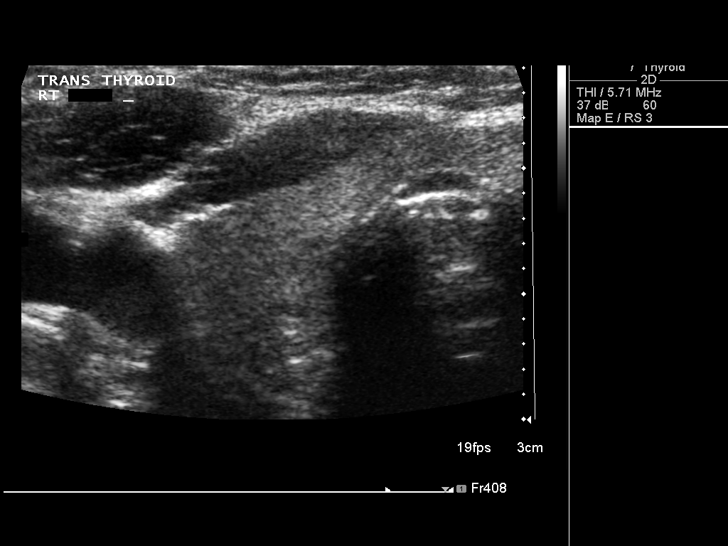
[im 13/51]
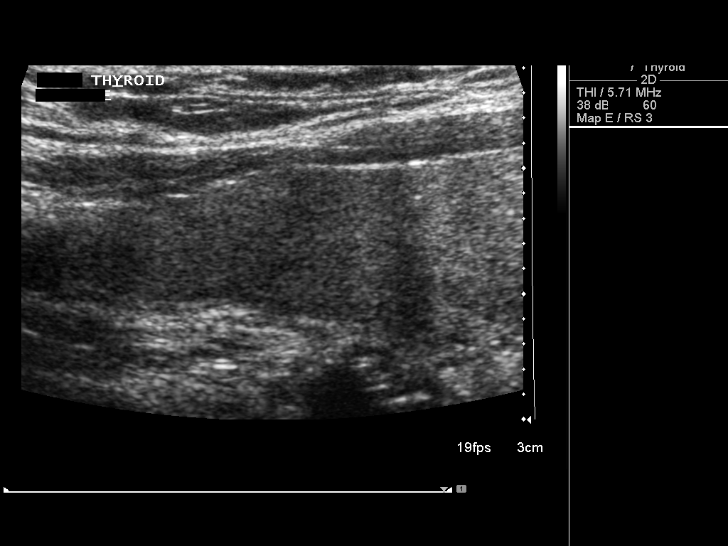
[im 17/51]
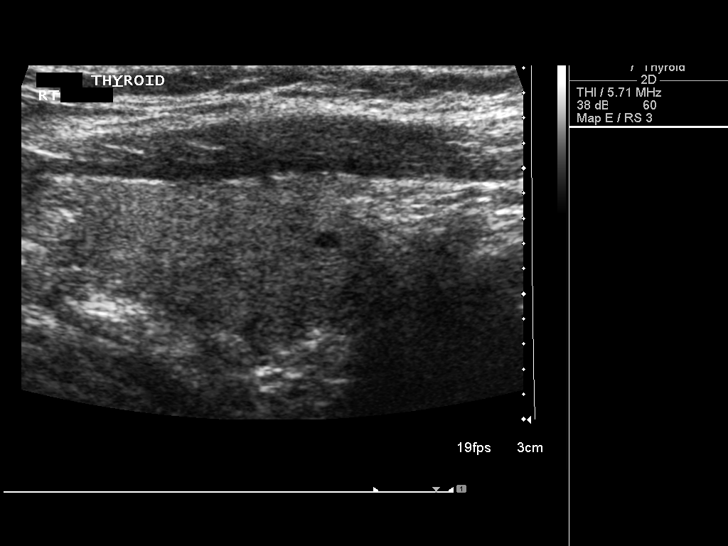
[im 19/51]
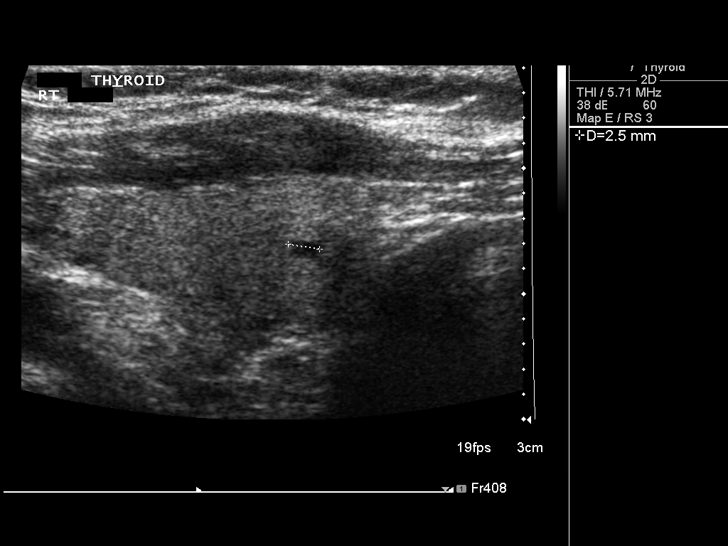
[im 23/51]
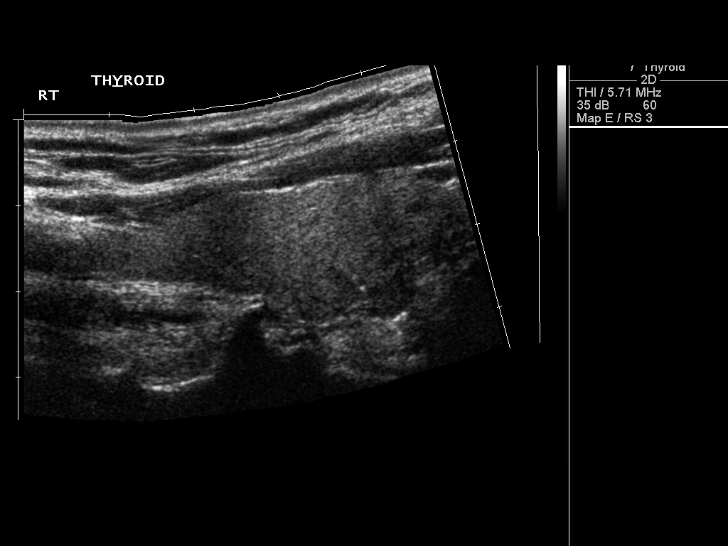
[im 28/51]
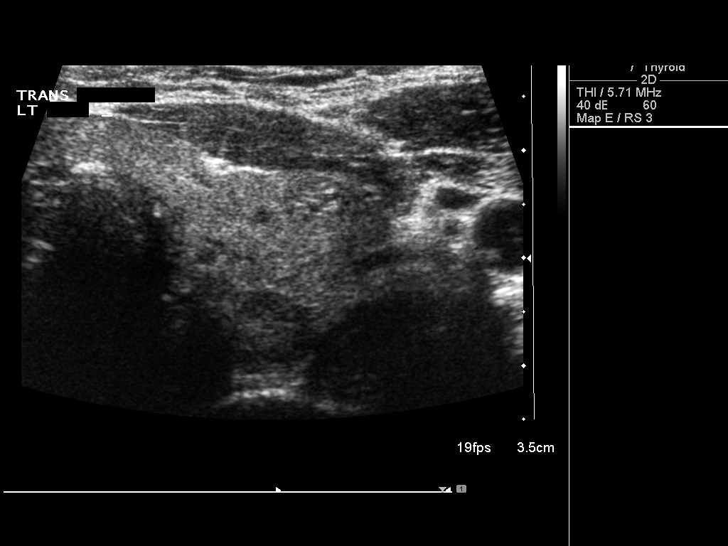
[im 32/51]
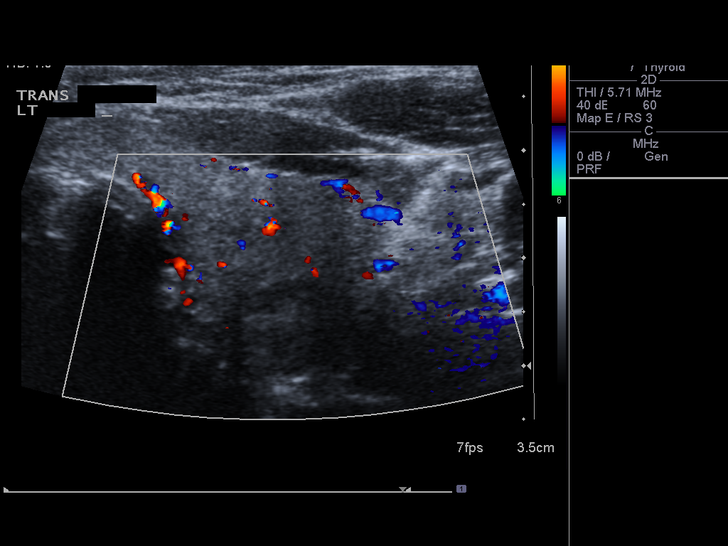
[im 34/51]
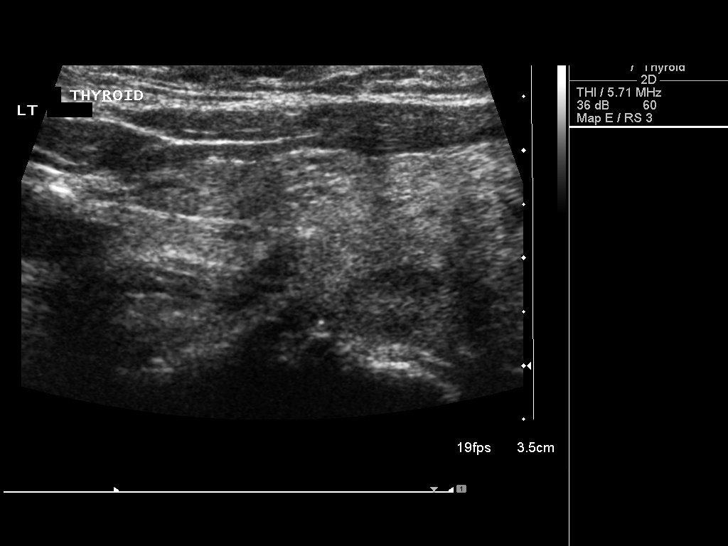
[im 38/51]
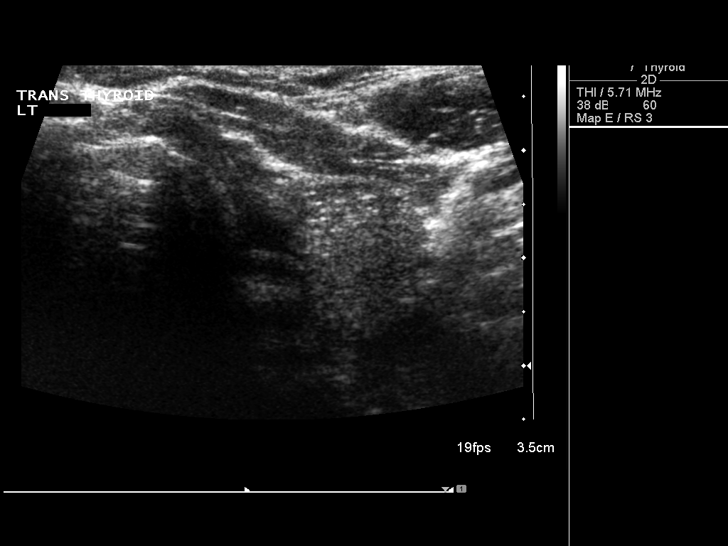
[im 42/51]
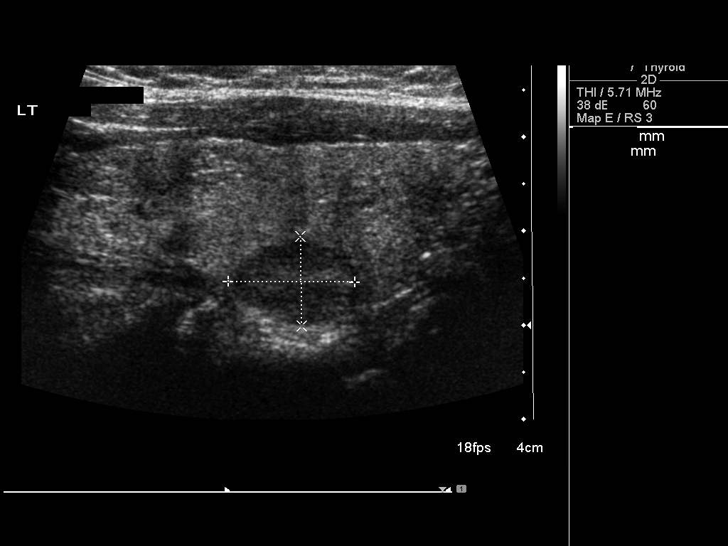
[im 46/51]
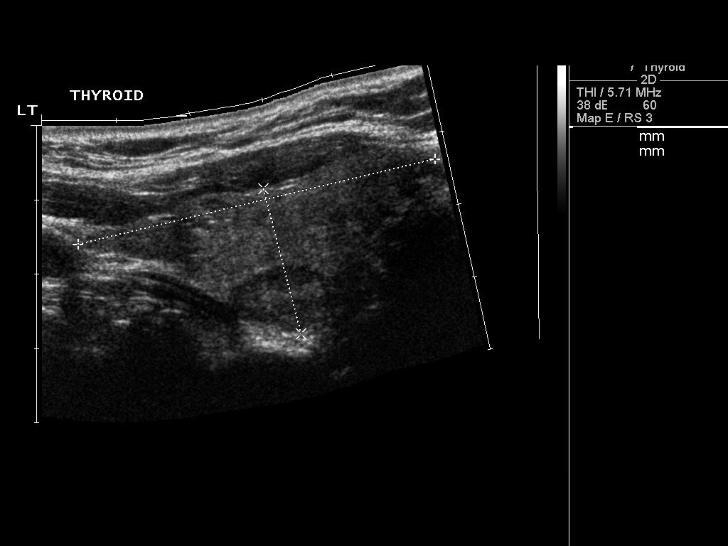
[im 51/51]
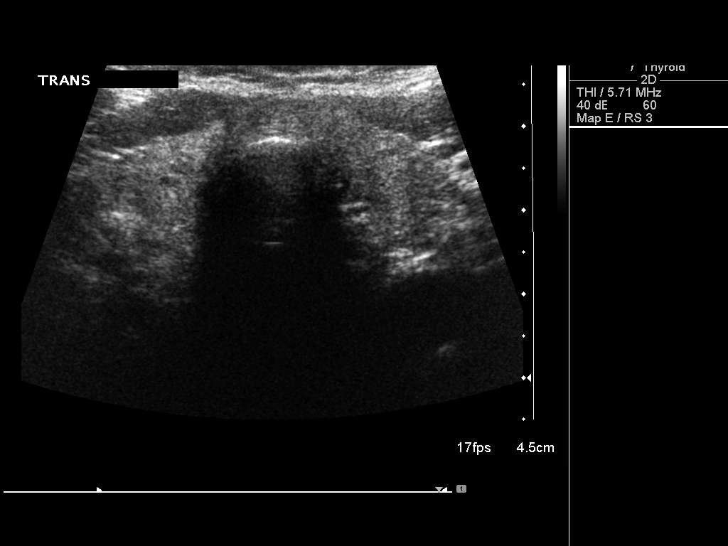

[14 of 25 positions shown; findings below may reference images not displayed]

FINDINGS: Right thyroid lobe:  5.8 x 1.7 x 1.4 cm.  (Previously 84.6 x 1.6 x
1.4 cm).
Left thyroid lobe:  5.0 x 2.0 x 2.1 cm.  (Previously 4.3 x 2.0 x
1.8 cm).
Isthmus:    4.2 mm in thickness.

Focal nodules:  The echogenicity of the thyroid gland is somewhat
inhomogeneous.  A solid nodule in the left mid lobe measures 1.3 x
1.0 x 1.1 cm compared to prior measurements of 1.5 x 0.9 x 0.5 cm.
Smaller nodules are present of no more than 9 mm in diameter.

Lymphadenopathy:  None visualized.
IMPRESSION: Stable thyroid nodules.  No enlarging nodule is seen.

## 2013-05-13 DIAGNOSIS — E042 Nontoxic multinodular goiter: Secondary | ICD-10-CM | POA: Diagnosis not present

## 2013-05-20 DIAGNOSIS — E042 Nontoxic multinodular goiter: Secondary | ICD-10-CM | POA: Diagnosis not present

## 2013-07-04 DIAGNOSIS — M25519 Pain in unspecified shoulder: Secondary | ICD-10-CM | POA: Diagnosis not present

## 2013-07-28 DIAGNOSIS — M25519 Pain in unspecified shoulder: Secondary | ICD-10-CM | POA: Diagnosis not present

## 2013-10-02 DIAGNOSIS — M25519 Pain in unspecified shoulder: Secondary | ICD-10-CM | POA: Diagnosis not present

## 2013-10-02 DIAGNOSIS — M542 Cervicalgia: Secondary | ICD-10-CM | POA: Diagnosis not present

## 2013-10-09 DIAGNOSIS — M75110 Incomplete rotator cuff tear or rupture of unspecified shoulder, not specified as traumatic: Secondary | ICD-10-CM | POA: Diagnosis not present

## 2013-10-09 DIAGNOSIS — M19019 Primary osteoarthritis, unspecified shoulder: Secondary | ICD-10-CM | POA: Diagnosis not present

## 2013-10-20 ENCOUNTER — Other Ambulatory Visit: Payer: Self-pay

## 2013-10-20 DIAGNOSIS — Z1231 Encounter for screening mammogram for malignant neoplasm of breast: Secondary | ICD-10-CM

## 2013-10-28 DIAGNOSIS — M25519 Pain in unspecified shoulder: Secondary | ICD-10-CM | POA: Diagnosis not present

## 2013-11-03 ENCOUNTER — Ambulatory Visit (HOSPITAL_COMMUNITY): Payer: 59 | Admitting: Occupational Therapy

## 2013-11-04 ENCOUNTER — Ambulatory Visit (HOSPITAL_COMMUNITY): Payer: 59 | Admitting: Occupational Therapy

## 2013-11-06 ENCOUNTER — Ambulatory Visit (HOSPITAL_COMMUNITY)
Admission: RE | Admit: 2013-11-06 | Discharge: 2013-11-06 | Disposition: A | Discharge status: Home / Self Care | Payer: 59 | Source: Ambulatory Visit | Attending: Orthopedic Surgery | Admitting: Orthopedic Surgery

## 2013-11-06 DIAGNOSIS — IMO0001 Reserved for inherently not codable concepts without codable children: Secondary | ICD-10-CM | POA: Insufficient documentation

## 2013-11-06 DIAGNOSIS — M25519 Pain in unspecified shoulder: Secondary | ICD-10-CM | POA: Diagnosis not present

## 2013-11-06 DIAGNOSIS — M6281 Muscle weakness (generalized): Secondary | ICD-10-CM | POA: Diagnosis not present

## 2013-11-06 DIAGNOSIS — M25511 Pain in right shoulder: Secondary | ICD-10-CM | POA: Insufficient documentation

## 2013-11-06 NOTE — Evaluation (Addendum)
Occupational Therapy Evaluation  Patient Details  Name: Danielle Oconnell MRN: [id number] Date of Birth: 06/16/1935  Today's Date: 11/06/2013 Time: 0810-0850 OT Time Calculation (min): 40 min Evaluation 7829-5621 (25') Manual 3086-5784 (15')  Visit#: 1 of 12  Re-eval: 12/04/13  Assessment Diagnosis: Pain in right shoulder  Surgical Date:  (n/a) Next MD Visit: as needed  Prior Therapy: none  Authorization: [employer]  Authorization Time Period:    Authorization Visit#:   of     Past Medical History: No past medical history on file. Past Surgical History: No past surgical history on file.  Subjective Symptoms/Limitations Symptoms: S:  The doctor says the MRI shows I have a tear in my shoulder and that surgery would be the last resort for right now.  Pertinent History: Patient reports no specific incident or injury to her RUE/shoulder.  She states she has had progressively worsening pain in her shoulder with tasks/activities which require her to lift or reach overhead.  States she went to MD and was diagnosed with right rotator cuff tear and is now referred for therapy  Patient Stated Goals: to get pain free  Pain Assessment Currently in Pain?: Yes Pain Score: 5  Pain Location: Shoulder Pain Orientation: Right Pain Type: Acute pain Multiple Pain Sites: No  Precautions/Restrictions  Precautions Precautions: None Restrictions Weight Bearing Restrictions: No  Balance Screening Balance Screen Has the patient fallen in the past 6 months: No Has the patient had a decrease in activity level because of a fear of falling? : No Is the patient reluctant to leave their home because of a fear of falling? 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  Dominant Hand: Right Vision - History Baseline Vision: Wears glasses all the time Vision - Assessment Eye Alignment: Within Functional Limits Perception Perception: Within Functional Limits  Cognition/Observation Cognition Overall Cognitive Status: Within Functional Limits for tasks assessed Arousal/Alertness: Awake/alert Orientation Level: Oriented X4  Sensation/Coordination/Edema Sensation Light Touch: Appears Intact Additional Comments: patient reports she sleeps with splint on her right hand most of times. Coordination Gross Motor Movements are Fluid and Coordinated: Yes Fine Motor Movements are Fluid and Coordinated: Yes Edema Edema: patient reports increased swelling in her right hand   Additional Assessments RUE AROM (degrees) RUE Overall AROM Comments: assessed in standing  Right Shoulder Flexion: 131 Degrees (with pain 6/10) Right Shoulder ABduction: 143 Degrees Right Shoulder Internal Rotation: 82 Degrees Right Shoulder External Rotation: 72 Degrees (pain 4/10) RUE Strength RUE Overall Strength Comments: complaint of pain 6/10 with MMT assessment in anterior shoulder  Right Shoulder Flexion: 3-/5 Right Shoulder ABduction: 3/5 Right Shoulder Internal Rotation: 3+/5 Right Shoulder External Rotation: 3-/5 Grip (lbs): 32 LUE Strength Grip (lbs): 48 Palpation Palpation: min-mod fascial restrictions in rightupper arm, trap and scapular regions      Exercise/Treatments     Manual Therapy Manual Therapy: Myofascial release Myofascial Release: MFR and manual to right upper  arm, trapezius, scap regions to decrease fascial restrictions, increase joint mobility in a pain free zone.  Occupational Therapy Assessment and Plan OT Assessment and Plan Clinical Impression Statement: A:  Patient presents with decreased use of dominant RUE in daily tasks/activities secondary to pain and newly diagnosed Rotator Cuff Tear.  Deficits include decreased range of motion, decreased strength, decreased functional use, increased pain, increased edema, increased fascial restriction.  Pt will benefit from skilled therapeutic intervention in order to improve on the following deficits: Increased edema;Impaired UE functional use;Increased fascial restricitons;Decreased activity tolerance;Decreased range of motion;Decreased strength;Pain Rehab Potential: Good OT Frequency: Min 2X/week OT Duration: 6 weeks OT Treatment/Interventions: Self-care/ADL training;Therapeutic activities;Therapeutic exercise;Patient/family education;Manual therapy;Modalities OT Plan: P:  Skilled OT interventions to decreased pain and fascial restrictions and increase pain free mobiliity, strength and functional use of dominant RUE with all B/IADLs, leisure and work activities.     Treatment Plan:  MFR and manual stretching;  P/AAROM in supine, elevation, row, extension, therapy ball stretches, pro/ret/elev/dep.  Progress as tolerated.    Goals Home Exercise Program Pt/caregiver will Perform Home Exercise Program: For increased ROM;For increased strengthening PT Goal: Perform Home Exercise Program - Progress: Goal set today Short Term Goals Time to Complete Short Term Goals: 3 weeks Short Term Goal 1: Patient will be educated on HEP Short Term Goal 2: Patient will increase PROM to [facility] with complaint of pain </= 2/10to decrease donning/doffing pullover shirts Short Term Goal 3: Patient will increase shoulder strength to 4-/5 to increase ability to complete IADL tasks (vaccuuming, sweeping) Short Term Goal 4: Patient  will have </=2/10 complaint of pain when completing daily activities  Short Term Goal 5: Patient will increase right grip strength by 5# to increase ability to open containers Additional Short Term Goals?: Yes Short Term Goal 6: Patient will decrease fascial resttrictions to </= min to increase ability to reach overhead with greater ease  Long Term Goals Time to Complete Long Term Goals: 6 weeks Long Term Goal 1: Patient will return to highest level of indepenendence  with all daily, leisure and work activities Long Term Goal 2: Patient will increase AROM RUE to WNL  with pain </= 2/10  for increased independnence all daily B/IADL tasks/activities  Long Term Goal 3: Patient will increase right shoulder strength to >/= 4/5  in order to lift grocery bags, perform job duties  and complete heavier IADL tasks.  Long Term Goal 4: Patient will demo right grip strength >/= 50# to increase ability to perform all IADL tasks, open containers with greated ease.  Long Term Goal 5: Patient will decrease fascial restrictions in right arm to </= trace to increase ability to reach overhead with decreased pain.   Problem List Patient Active Problem List   Diagnosis Date Noted  . Right shoulder pain 11/06/2013  . Weakness of right leg 06/04/2012  . Difficulty in walking 06/04/2012    End of Session Activity Tolerance: Patient tolerated treatment well General Behavior During Therapy: [facility] for tasks assessed/performed OT Plan of Care OT Home Exercise Plan: table slides OT Patient Instructions: handout given  Consulted and Agree with Plan of Care: Patient  GO Functional Assessment Tool Used: FOTO 39/100 Late entry by rehab supervisor to add g codes Reaching current cl Reaching goals cj Velora Mediate, OTR/L  11/06/2013, 9:35 AM  Physician Documentation Your signature is required to indicate approval of the treatment plan as stated above.  Please sign and either send electronically or make a copy of  this report for your files and return this physician signed original.  Please mark one 1.__approve of  plan  2. ___approve of plan with the following conditions.   ______________________________                                                          _____________________ Physician Signature                                                                                                             Date

## 2013-11-11 ENCOUNTER — Ambulatory Visit (HOSPITAL_COMMUNITY): Payer: 59 | Admitting: Occupational Therapy

## 2013-11-21 ENCOUNTER — Other Ambulatory Visit: Payer: Self-pay

## 2013-11-21 ENCOUNTER — Ambulatory Visit
Admission: RE | Admit: 2013-11-21 | Discharge: 2013-11-21 | Disposition: A | Discharge status: Home / Self Care | Payer: 59 | Source: Ambulatory Visit

## 2013-11-21 DIAGNOSIS — Z1231 Encounter for screening mammogram for malignant neoplasm of breast: Secondary | ICD-10-CM

## 2013-11-25 ENCOUNTER — Ambulatory Visit (HOSPITAL_COMMUNITY)
Admission: RE | Admit: 2013-11-25 | Discharge: 2013-11-25 | Disposition: A | Discharge status: Home / Self Care | Payer: 59 | Source: Ambulatory Visit | Attending: Orthopedic Surgery | Admitting: Orthopedic Surgery

## 2013-11-25 DIAGNOSIS — IMO0001 Reserved for inherently not codable concepts without codable children: Secondary | ICD-10-CM | POA: Diagnosis not present

## 2013-11-25 DIAGNOSIS — M6281 Muscle weakness (generalized): Secondary | ICD-10-CM | POA: Diagnosis not present

## 2013-11-25 DIAGNOSIS — M25519 Pain in unspecified shoulder: Secondary | ICD-10-CM | POA: Insufficient documentation

## 2013-11-25 NOTE — Progress Notes (Signed)
Occupational Therapy Treatment Patient Details  Name: Danielle Oconnell MRN: [id number] Date of Birth: 02-Feb-1935  Today's Date: 11/25/2013 Time: 9833-8250 OT Time Calculation (min): 40 min Manual 5397-6734 (25') therExercises 1937-902 (15')  Visit#: 2 of 12  Re-eval: 12/04/13    Authorization: UMR    Subjective Symptoms/Limitations Symptoms: S:  It is aching all the way down my arm and into my fingers today  Pain Assessment Currently in Pain?: Yes Pain Score: 6  Pain Location: Shoulder Pain Orientation:  (RUE) Pain Type: Acute pain Multiple Pain Sites: No  Exercise/Treatments Supine Protraction: PROM;10 reps Horizontal ABduction: PROM;10 reps External Rotation: PROM;10 reps Internal Rotation: PROM;10 reps Flexion: PROM;10 reps ABduction: PROM;10 reps Other Supine Exercises: bridges x 15reps  Seated Elevation: AROM;10 reps Extension: AROM;10 reps Row: AAROM;10 reps   Therapy Ball Flexion: 15 reps ABduction: 15 reps     Manual Therapy Manual Therapy: Myofascial release Myofascial Release: MFR and manual to right upper arm, trapezius, scap regions to decrease fascial restrictions, increase joint mobility in a pain free zone  Occupational Therapy Assessment and Plan OT Assessment and Plan Clinical Impression Statement: A: Patient arrives with increased complaint of pain throughout arm this date with noted increased tightness in proximal forearm and bicep area this date.  Patient tolerated MFR and manual stretching this date fair with some noted relaxation.  Educated patient on positioning to decreased elbow flexion  when at rest and during mobility.  OT Plan: P: pro/ret/elev/dep, elbow/wrist/hand range   Goals Short Term Goals Short Term Goal 1: Patient will be educated on HEP Short Term Goal 1 Progress: Progressing toward goal Short Term Goal 2: Patient will increase PROM to [facility] with complaint of pain </= 2/10to decrease donning/doffing pullover shirts Short  Term Goal 2 Progress: Progressing toward goal Short Term Goal 3: Patient will increase shoulder strength to 4-/5 to increase ability to complete IADL tasks (vaccuuming, sweeping) Short Term Goal 3 Progress: Progressing toward goal Short Term Goal 4: Patient will have </=2/10 complaint of pain when completing daily activities  Short Term Goal 4 Progress: Progressing toward goal Short Term Goal 5: Patient will increase right grip strength by 5# to increase ability to open containers Short Term Goal 5 Progress: Progressing toward goal Long Term Goals Long Term Goal 1: Patient will return to highest level of indepenendence  with all daily, leisure and work activities Long Term Goal 1 Progress: Progressing toward goal Long Term Goal 2: Patient will increase AROM RUE to WNL  with pain </= 2/10  for increased independnence all daily B/IADL tasks/activities  Long Term Goal 2 Progress: Progressing toward goal Long Term Goal 3: Patient will increase right shoulder strength to >/= 4/5  in order to lift grocery bags, perform job duties  and complete heavier IADL tasks.  Long Term Goal 3 Progress: Progressing toward goal Long Term Goal 4: Patient will demo right grip strength >/= 50# to increase ability to perform all IADL tasks, open containers with greated ease.  Long Term Goal 4 Progress: Progressing toward goal Long Term Goal 5: Patient will decrease fascial restrictions in right arm to </= trace to increase ability to reach overhead with decreased pain.  Long Term Goal 5 Progress: Progressing toward goal  Problem List Patient Active Problem List   Diagnosis Date Noted  . Right shoulder pain 11/06/2013  . Weakness of right leg 06/04/2012  . Difficulty in walking 06/04/2012    End of Session Activity Tolerance: Patient tolerated treatment well General Behavior During  Therapy: [facility] for tasks assessed/performed  GO    Festus Pursel,OTR/L  11/25/2013, 8:49 AM

## 2013-11-27 ENCOUNTER — Ambulatory Visit (HOSPITAL_COMMUNITY)
Admission: RE | Admit: 2013-11-27 | Discharge: 2013-11-27 | Disposition: A | Discharge status: Home / Self Care | Payer: 59 | Source: Ambulatory Visit | Attending: Internal Medicine | Admitting: Internal Medicine

## 2013-11-27 DIAGNOSIS — M6281 Muscle weakness (generalized): Secondary | ICD-10-CM | POA: Diagnosis not present

## 2013-11-27 DIAGNOSIS — M25519 Pain in unspecified shoulder: Secondary | ICD-10-CM | POA: Diagnosis not present

## 2013-11-27 DIAGNOSIS — IMO0001 Reserved for inherently not codable concepts without codable children: Secondary | ICD-10-CM | POA: Diagnosis not present

## 2013-11-27 NOTE — Progress Notes (Signed)
Occupational Therapy Treatment Patient Details  Name: Danielle Oconnell MRN: [id number] Date of Birth: November 15, 1935  Today's Date: 11/27/2013 Time: 6384-6659 OT Time Calculation (min): 40 min Manual [phone] (15') Therapeutic Exercises [phone] (25')  Visit#: 3 of 12  Re-eval: 12/04/13    Authorization: [employer]  Authorization Time Period:    Authorization Visit#:   of    Subjective Symptoms/Limitations Symptoms: "I can feel it some, but it really isn't bad when I lay down, like it had been." Pain Assessment Currently in Pain?: Yes Pain Score: 3  Pain Location: Shoulder Pain Orientation: Right Pain Type: Acute pain  Exercise/Treatments Supine Protraction: PROM;10 reps;AAROM (1 lb bar) Horizontal ABduction: PROM;10 reps;AAROM (1 lb bar) External Rotation: PROM;10 reps;AAROM (1 lb bar) Internal Rotation: PROM;10 reps;AAROM (1 lb bar) Flexion: PROM;AAROM;10 reps (1lb bar) ABduction: PROM;10 reps;AAROM (1 lb bar) Other Supine Exercises: bridges x 15reps  Seated Elevation: AROM;10 reps Extension: AROM;10 reps   Therapy Ball Flexion: 15 reps ABduction: 15 reps ROM / Strengthening / Isometric Strengthening Thumb Tacks: 30 sec Prot/Ret//Elev/Dep: 5 reps   Manual Therapy Manual Therapy: Myofascial release Myofascial Release: MFR and manual to right upper arm, trapezius, scap regions to decrease fascial restrictions, increase joint mobility in a pain free zone.   Occupational Therapy Assessment and Plan OT Assessment and Plan Clinical Impression Statement: A: Pt with decreased complaint of pain this date, but still noted tightness in bicep area.  Added AAROM this date and pt tolerated well with no noted increased in pain. OT Plan: P: Progress w AAROM as tolerated. Cont to follow-up with sleep positioning and radiating pain down arm.   Goals Short Term Goals Short Term Goal 1: Patient will be educated on HEP Short Term Goal 1 Progress: Progressing toward goal Short Term Goal 2:  Patient will increase PROM to [facility] with complaint of pain </= 2/10to decrease donning/doffing pullover shirts Short Term Goal 2 Progress: Progressing toward goal Short Term Goal 3: Patient will increase shoulder strength to 4-/5 to increase ability to complete IADL tasks (vaccuuming, sweeping) Short Term Goal 3 Progress: Progressing toward goal Short Term Goal 4: Patient will have </=2/10 complaint of pain when completing daily activities  Short Term Goal 4 Progress: Progressing toward goal Short Term Goal 5: Patient will increase right grip strength by 5# to increase ability to open containers Short Term Goal 5 Progress: Progressing toward goal Additional Short Term Goals?: Yes Short Term Goal 6: Patient will decrease fascial resttrictions to </= min to increase ability to reach overhead with greater ease  Short Term Goal 6 Progress: Progressing toward goal Long Term Goals Long Term Goal 1: Patient will return to highest level of indepenendence  with all daily, leisure and work activities Long Term Goal 1 Progress: Progressing toward goal Long Term Goal 2: Patient will increase AROM RUE to WNL  with pain </= 2/10  for increased independnence all daily B/IADL tasks/activities  Long Term Goal 2 Progress: Progressing toward goal Long Term Goal 3: Patient will increase right shoulder strength to >/= 4/5  in order to lift grocery bags, perform job duties  and complete heavier IADL tasks.  Long Term Goal 3 Progress: Progressing toward goal Long Term Goal 4: Patient will demo right grip strength >/= 50# to increase ability to perform all IADL tasks, open containers with greated ease.  Long Term Goal 4 Progress: Progressing toward goal Long Term Goal 5: Patient will decrease fascial restrictions in right arm to </= trace to increase ability to reach  overhead with decreased pain.  Long Term Goal 5 Progress: Progressing toward goal  Problem List Patient Active Problem List   Diagnosis Date Noted  .  Right shoulder pain 11/06/2013  . Weakness of right leg 06/04/2012  . Difficulty in walking 06/04/2012    End of Session Activity Tolerance: Patient tolerated treatment well General Behavior During Therapy: [facility] for tasks assessed/performed  GO   Bea Graff, MS, OTR/L [phone]  11/27/2013, 8:51 AM

## 2013-12-02 ENCOUNTER — Ambulatory Visit (HOSPITAL_COMMUNITY)
Admission: RE | Admit: 2013-12-02 | Discharge: 2013-12-02 | Disposition: A | Discharge status: Home / Self Care | Payer: 59 | Source: Ambulatory Visit | Attending: Internal Medicine | Admitting: Internal Medicine

## 2013-12-02 DIAGNOSIS — M25519 Pain in unspecified shoulder: Secondary | ICD-10-CM | POA: Diagnosis not present

## 2013-12-02 DIAGNOSIS — M6281 Muscle weakness (generalized): Secondary | ICD-10-CM | POA: Diagnosis not present

## 2013-12-02 DIAGNOSIS — IMO0001 Reserved for inherently not codable concepts without codable children: Secondary | ICD-10-CM | POA: Diagnosis not present

## 2013-12-04 ENCOUNTER — Ambulatory Visit (HOSPITAL_COMMUNITY)
Admission: RE | Admit: 2013-12-04 | Discharge: 2013-12-04 | Disposition: A | Discharge status: Home / Self Care | Payer: 59 | Source: Ambulatory Visit | Attending: Orthopedic Surgery | Admitting: Orthopedic Surgery

## 2013-12-04 DIAGNOSIS — M6281 Muscle weakness (generalized): Secondary | ICD-10-CM | POA: Diagnosis not present

## 2013-12-04 DIAGNOSIS — M25519 Pain in unspecified shoulder: Secondary | ICD-10-CM | POA: Diagnosis not present

## 2013-12-04 DIAGNOSIS — IMO0001 Reserved for inherently not codable concepts without codable children: Secondary | ICD-10-CM | POA: Diagnosis not present

## 2013-12-04 NOTE — Progress Notes (Deleted)
Occupational Therapy Treatment Patient Details  Name: Danielle Oconnell MRN: [id number] Date of Birth: August 21, 1935  Today's Date: 12/04/2013 Time:  -     Visit#:   of    Re-eval: 12/04/13    Authorization: [employer]  Authorization Time Period:    Authorization Visit#:   of    Subjective Symptoms/Limitations Symptoms: S:  My shoulder started hurting me real bad when i was in the beauty salon chair Friday and has been paining off and on ever since. . Pain Assessment Currently in Pain?: Yes Pain Score: 6  Pain Location: Shoulder Pain Orientation: Right Pain Type: Acute pain  Precautions/Restrictions     Exercise/Treatments      12/02/13 0800  Shoulder Exercises: Supine  Protraction PROM;10 reps;AAROM;12 reps  Horizontal ABduction PROM;10 reps;AAROM;12 reps  External Rotation PROM;10 reps;AAROM;12 reps  Internal Rotation PROM;10 reps;AAROM;12 reps  Flexion PROM;10 reps;AAROM;12 reps  ABduction PROM;10 reps;AAROM;12 reps  Other Supine Exercises `  Shoulder Exercises: Seated  Elevation AROM;12 reps  Retraction AROM;12 reps  Shoulder Exercises: Therapy Ball  Flexion 20 reps  ABduction 20 reps  Shoulder Exercises: ROM/Strengthening  Proximal Shoulder Strengthening, Supine 1'      Occupational Therapy Assessment and Plan     Goals    Problem List Patient Active Problem List   Diagnosis Date Noted  . Right shoulder pain 11/06/2013  . Weakness of right leg 06/04/2012  . Difficulty in walking 06/04/2012       GO    Donney Rankins 12/04/2013, 8:24 AM

## 2013-12-04 NOTE — Progress Notes (Signed)
Occupational Therapy Treatment Patient Details  Name: Danielle Oconnell MRN: [id number] Date of Birth: [birth year]  Today's Date: 12/02/2013 Time: 0805-0849 OT Time Calculation (min): 44 min Manual [phone] (20') TherExercises 5093-2671 (24')  Visit#: 4 of 12  Re-eval: 12/04/13    Authorization: UMR  Authorization Time Period:    Authorization Visit#:   of    Subjective Symptoms/Limitations Symptoms: S:  My shoulder started hurting me real bad when i was in the beauty salon chair Friday and has been paining off and on ever since. . Pain Assessment Currently in Pain?: Yes Pain Score: 6  Pain Location: Shoulder Pain Orientation: Right Pain Type: Acute pain   Exercise/Treatments  12/02/13 0800  Shoulder Exercises: Supine  Protraction PROM;10 reps;AAROM;12 reps  Horizontal ABduction PROM;10 reps;AAROM;12 reps  External Rotation PROM;10 reps;AAROM;12 reps  Internal Rotation PROM;10 reps;AAROM;12 reps  Flexion PROM;10 reps;AAROM;12 reps  ABduction PROM;10 reps;AAROM;12 reps  Other Supine Exercises `  Shoulder Exercises: Seated  Elevation AROM;12 reps  Retraction AROM;12 reps  Shoulder Exercises: Therapy Ball  Flexion 20 reps  ABduction 20 reps  Shoulder Exercises: ROM/Strengthening  Proximal Shoulder Strengthening, Supine 1'      Manual Therapy Manual Therapy: Myofascial release Myofascial Release: MFR and manual to right upper arm, trapezius, scap regions to decrease fascial restrictions, increase joint mobility in a pain free zone.  Occupational Therapy Assessment and Plan OT Assessment and Plan Clinical Impression Statement: A: Patient with increased complaint of soreness lower trap area and anterior shoulder. Patient tolerated MFR and manual well this date with imporved complaint of pain to 3/10 following tx. good form and range with AAROM exercises this date. Increased focus on rectracting scapula this date. Patient with complaint of increased fatigue following OT  Plan: P:  Reassessment;  Self distraction education to patient   Goals Short Term Goals Short Term Goal 1: Patient will be educated on HEP Short Term Goal 1 Progress: Progressing toward goal Short Term Goal 2: Patient will increase PROM to [facility] with complaint of pain </= 2/10to decrease donning/doffing pullover shirts Short Term Goal 2 Progress: Progressing toward goal Short Term Goal 3: Patient will increase shoulder strength to 4-/5 to increase ability to complete IADL tasks (vaccuuming, sweeping) Short Term Goal 3 Progress: Progressing toward goal Short Term Goal 4: Patient will have </=2/10 complaint of pain when completing daily activities  Short Term Goal 4 Progress: Progressing toward goal Short Term Goal 5: Patient will increase right grip strength by 5# to increase ability to open containers Short Term Goal 5 Progress: Progressing toward goal Short Term Goal 6: Patient will decrease fascial resttrictions to </= min to increase ability to reach overhead with greater ease  Short Term Goal 6 Progress: Progressing toward goal Long Term Goals Long Term Goal 1: Patient will return to highest level of indepenendence  with all daily, leisure and work activities Long Term Goal 1 Progress: Progressing toward goal Long Term Goal 2: Patient will increase AROM RUE to WNL  with pain </= 2/10  for increased independnence all daily B/IADL tasks/activities  Long Term Goal 2 Progress: Progressing toward goal Long Term Goal 3: Patient will increase right shoulder strength to >/= 4/5  in order to lift grocery bags, perform job duties  and complete heavier IADL tasks.  Long Term Goal 3 Progress: Progressing toward goal Long Term Goal 4: Patient will demo right grip strength >/= 50# to increase ability to perform all IADL tasks, open containers with greated ease.  Long Term Goal  4 Progress: Progressing toward goal Long Term Goal 5: Patient will decrease fascial restrictions in right arm to </= trace to  increase ability to reach overhead with decreased pain.  Long Term Goal 5 Progress: Progressing toward goal  Problem List Patient Active Problem List   Diagnosis Date Noted  . Right shoulder pain 11/06/2013  . Weakness of right leg 06/04/2012  . Difficulty in walking 06/04/2012    End of Session Activity Tolerance: Patient tolerated treatment well General Behavior During Therapy: [facility] for tasks assessed/performed  GO    Donney Rankins, OTR/L 12/02/2013, 8:50 AM

## 2013-12-09 ENCOUNTER — Ambulatory Visit (HOSPITAL_COMMUNITY)
Admission: RE | Admit: 2013-12-09 | Discharge: 2013-12-09 | Disposition: A | Discharge status: Home / Self Care | Payer: 59 | Source: Ambulatory Visit | Attending: Internal Medicine | Admitting: Internal Medicine

## 2013-12-09 DIAGNOSIS — M25519 Pain in unspecified shoulder: Secondary | ICD-10-CM | POA: Diagnosis not present

## 2013-12-09 DIAGNOSIS — M6281 Muscle weakness (generalized): Secondary | ICD-10-CM | POA: Diagnosis not present

## 2013-12-09 DIAGNOSIS — IMO0001 Reserved for inherently not codable concepts without codable children: Secondary | ICD-10-CM | POA: Diagnosis not present

## 2013-12-09 NOTE — Evaluation (Addendum)
Occupational Therapy Re-Evaluation  Patient Details  Name: Danielle Oconnell MRN: [id number] Date of Birth: [birth year]  Today's Date: 12/04/2013 Time: 0805-0850 OT Time Calculation (min): 45 min Reassessment 4315-4008 (11') Manual [phone] ([place] 3267-1245 (72')   Visit#: 5 of 12  Re-eval: 01/01/14     Authorization: UMR  Past Medical History: No past medical history on file. Past Surgical History: No past surgical history on file.  Subjective Symptoms/Limitations Symptoms: S:  My shoulder feels better but I still have pain in it.  Pain Assessment Currently in Pain?: Yes Pain Score: 4 Pain Location: Shoulder Pain Orientation: Right Pain Type: Acute pain  Precautions/Restrictions  Precautions Precautions: None Restrictions Weight Bearing Restrictions: No  Balance Screening Balance Screen Has the patient fallen in the past 6 months: No     Assessment     12/04/13 0805  Assessment  Diagnosis Pain in right shoulder   Next MD Visit as needed   Precautions  Precautions None  Restrictions  Weight Bearing Restrictions No  Balance Screen  Has the patient fallen in the past 6 months No  ADL  ADL Comments reports improved ability to donn/doff shirts, brush/roll her hair however cont to report difficulty lifting bags, vsccuuming/sweeping   Sensation  Light Touch Appears Intact  Additional Comments patient reports she sleeps with splint on her right hand most of times.  Coordination  Gross Motor Movements are Fluid and Coordinated Yes  Fine Motor Movements are Fluid and Coordinated Yes  Edema  Edema reports improvement noted in edema of right hand.   RUE AROM (degrees)  RUE Overall AROM Comments assessed in standing   Right Shoulder Flexion 140 Degrees (131)  Right Shoulder ABduction 155 Degrees (143)  Right Shoulder Internal Rotation 90 Degrees (82)  Right Shoulder External Rotation 68 Degrees (72)  RUE Strength  RUE Overall Strength Comments  complaint of pain 64/10 with MMT assessment in anterior shoulder this date following manual therapy    Right Shoulder Flexion 3/5 (3-/5)  Right Shoulder ABduction 3+/5 (3/5)  Right Shoulder Internal Rotation 4/5 (3+/5)  Right Shoulder External Rotation 3-/5 (3-/5)  Grip (lbs) (NT this date (32))  LUE Strength  Grip (lbs) (NT this date (48))  Palpation  Palpation min fascial restrictions in rightupper arm, trap and scapular regions   Written Expression  Dominant Hand Right     Exercise/Treatments  12/04/13 0805  Shoulder Exercises: Supine  Protraction PROM;10 reps;AAROM;12 reps  Horizontal ABduction PROM;10 reps;AAROM;12 reps  External Rotation PROM;10 reps;AAROM;12 reps  Internal Rotation PROM;10 reps;AAROM;12 reps  Flexion PROM;10 reps;AAROM;12 reps  ABduction PROM;10 reps;AAROM;12 reps  Other Supine Exercises `  Shoulder Exercises: Therapy Ball  Flexion 20 reps  ABduction 20 reps  Shoulder Exercises: ROM/Strengthening  Proximal Shoulder Strengthening, Supine 1'      Manual Therapy Manual Therapy: Myofascial release Myofascial Release: MFR and manual to right upper arm, trapezius, scap regions to decrease fascial restrictions, increase joint mobility in a pain free zone  Occupational Therapy Assessment and Plan OT Assessment and Plan Clinical Impression Statement: A:  Reassessment completed this date.   Patient demos improved range and strength despite increased complaint of pain this date.   She reports increased functional use of RUE in daitly tasks with improving pain.  Good follow through with HEP and good form with exercises.   OT Plan: P:  Self distraction education to patient, begin wall washing   Goals Short Term Goals Short Term Goal 1: Patient will be educated on HEP Short  Term Goal 1 Progress: Met Short Term Goal 2: Patient will increase PROM to [facility] with complaint of pain </= 2/10to decrease donning/doffing pullover shirts Short Term Goal 2 Progress:  Progressing toward goal Short Term Goal 3: Patient will increase shoulder strength to 4-/5 to increase ability to complete IADL tasks (vaccuuming, sweeping) Short Term Goal 3 Progress: Progressing toward goal Short Term Goal 4: Patient will have </=2/10 complaint of pain when completing daily activities  Short Term Goal 4 Progress: Progressing toward goal (patient with decreased complaint of pain until this date) Short Term Goal 5 Progress: Progressing toward goal Short Term Goal 6: Patient will decrease fascial resttrictions to </= min to increase ability to reach overhead with greater ease  Short Term Goal 6 Progress: Met Long Term Goals Long Term Goal 1: Patient will return to highest level of indepenendence  with all daily, leisure and work activities Long Term Goal 1 Progress: Progressing toward goal Long Term Goal 2: Patient will increase AROM RUE to WNL  with pain </= 2/10  for increased independnence all daily B/IADL tasks/activities  Long Term Goal 2 Progress: Progressing toward goal Long Term Goal 3: Patient will increase right shoulder strength to >/= 4/5  in order to lift grocery bags, perform job duties  and complete heavier IADL tasks.  Long Term Goal 3 Progress: Progressing toward goal Long Term Goal 4: Patient will demo right grip strength >/= 50# to increase ability to perform all IADL tasks, open containers with greated ease.  Long Term Goal 4 Progress: Progressing toward goal Long Term Goal 5: Patient will decrease fascial restrictions in right arm to </= trace to increase ability to reach overhead with decreased pain.  Long Term Goal 5 Progress: Progressing toward goal  Problem List Patient Active Problem List   Diagnosis Date Noted  . Right shoulder pain 11/06/2013  . Weakness of right leg 06/04/2012  . Difficulty in walking 06/04/2012    End of Session Activity Tolerance: Patient tolerated treatment well General Behavior During Therapy: [facility] for tasks  assessed/performed  GO Functional Assessment Tool Used: FOTO 50/100  (initial 39/100) Carry ck current [place] [facility] goal. Late entry by rehab supervisor Hazeline Junker, OTR/L Donney Rankins, OTR/L  12/04/2013, 1:39 PM  Physician Documentation Your signature is required to indicate approval of the treatment plan as stated above.  Please sign and either send electronically or make a copy of this report for your files and return this physician signed original.  Please mark one 1.__approve of plan  2. ___approve of plan with the following conditions.   ______________________________                                                          _____________________ Physician Signature  Date  

## 2013-12-09 NOTE — Progress Notes (Signed)
Occupational Therapy Treatment Patient Details  Name: Danielle Oconnell MRN: [id number] Date of Birth: 10-10-1935  Today's Date: 12/09/2013 Time: 0812-0854 OT Time Calculation (min): 42 min Manual 0812-0824 (12') TherExercises 5638-9373 (30'')  Visit#: 6 of 12  Re-eval: 01/01/14    Authorization: UMR   Subjective Symptoms/Limitations Symptoms: S:  My arm is doing better... it doesnt hurt me at work like it had.  Pain Assessment Currently in Pain?: Yes Pain Score: 2  Pain Location: Shoulder Pain Orientation: Right Pain Type: Acute pain     Exercise/Treatments Supine Protraction: AAROM;15 reps Horizontal ABduction: AAROM;15 reps External Rotation: AAROM;15 reps Internal Rotation: AAROM;15 reps Flexion: AAROM;15 reps ABduction: AAROM;15 reps Seated Elevation: AROM;15 reps Extension: AROM;15 reps Retraction: AROM;15 reps Horizontal ABduction: AROM;10 reps Flexion: AROM;10 reps Abduction: AROM;10 reps Other Seated Exercises: AROM scaption x 10 reps    Therapy Ball Flexion: 25 reps ABduction: 25 reps ROM / Strengthening / Isometric Strengthening Wall Wash: 1' X to V Arms: x 10 reps  Proximal Shoulder Strengthening, Supine: 1' (with 1# hand weights) Prot/Ret//Elev/Dep: 1'        Occupational Therapy Assessment and Plan OT Assessment and Plan Clinical Impression Statement: A:  Patient with improved complaint of pain and report improvement of functional use of RUE in work/daily tasks .  Good tolerance with min cues  for increased seated and wall  activities this date.  Patient  iwth complaint of pain 1/10 following therapy this date however with incresed complaint of fatigue.   OT Plan: P: weighted strengthening in supine pending pain.  progress as tolerated.    Goals Short Term Goals Short Term Goal 1: Patient will be educated on HEP Short Term Goal 1 Progress: Met Short Term Goal 2: Patient will increase PROM to [facility] with complaint of pain </= 2/10to decrease  donning/doffing pullover shirts Short Term Goal 2 Progress: Progressing toward goal Short Term Goal 3: Patient will increase shoulder strength to 4-/5 to increase ability to complete IADL tasks (vaccuuming, sweeping) Short Term Goal 3 Progress: Progressing toward goal Short Term Goal 4: Patient will have </=2/10 complaint of pain when completing daily activities  Short Term Goal 4 Progress: Progressing toward goal Short Term Goal 5: Patient will increase right grip strength by 5# to increase ability to open containers Short Term Goal 5 Progress: Progressing toward goal Short Term Goal 6: Patient will decrease fascial resttrictions to </= min to increase ability to reach overhead with greater ease  Short Term Goal 6 Progress: Met Long Term Goals Long Term Goal 1: Patient will return to highest level of indepenendence  with all daily, leisure and work activities Long Term Goal 1 Progress: Progressing toward goal Long Term Goal 2: Patient will increase AROM RUE to WNL  with pain </= 2/10  for increased independnence all daily B/IADL tasks/activities  Long Term Goal 2 Progress: Progressing toward goal Long Term Goal 3: Patient will increase right shoulder strength to >/= 4/5  in order to lift grocery bags, perform job duties  and complete heavier IADL tasks.  Long Term Goal 3 Progress: Progressing toward goal Long Term Goal 4: Patient will demo right grip strength >/= 50# to increase ability to perform all IADL tasks, open containers with greated ease.  Long Term Goal 4 Progress: Progressing toward goal Long Term Goal 5: Patient will decrease fascial restrictions in right arm to </= trace to increase ability to reach overhead with decreased pain.  Long Term Goal 5 Progress: Progressing toward goal  Problem  List Patient Active Problem List   Diagnosis Date Noted  . Right shoulder pain 11/06/2013  . Weakness of right leg 06/04/2012  . Difficulty in walking 06/04/2012    End of  Session Activity Tolerance: Patient tolerated treatment well General Behavior During Therapy: [facility] for tasks assessed/performed  GO    Donney Rankins, OTR/L  12/09/2013, 8:22 PM

## 2013-12-11 ENCOUNTER — Ambulatory Visit (HOSPITAL_COMMUNITY): Payer: 59 | Admitting: Occupational Therapy

## 2013-12-11 DIAGNOSIS — M7730 Calcaneal spur, unspecified foot: Secondary | ICD-10-CM | POA: Diagnosis not present

## 2013-12-17 ENCOUNTER — Ambulatory Visit (HOSPITAL_COMMUNITY)
Admission: RE | Admit: 2013-12-17 | Discharge: 2013-12-17 | Disposition: A | Discharge status: Home / Self Care | Payer: 59 | Source: Ambulatory Visit | Attending: Orthopedic Surgery | Admitting: Orthopedic Surgery

## 2013-12-17 DIAGNOSIS — M25519 Pain in unspecified shoulder: Secondary | ICD-10-CM | POA: Diagnosis not present

## 2013-12-17 DIAGNOSIS — M6281 Muscle weakness (generalized): Secondary | ICD-10-CM | POA: Diagnosis not present

## 2013-12-17 DIAGNOSIS — IMO0001 Reserved for inherently not codable concepts without codable children: Secondary | ICD-10-CM | POA: Diagnosis not present

## 2013-12-17 NOTE — Progress Notes (Signed)
Occupational Therapy Treatment Patient Details  Name: Danielle Oconnell MRN: [id number] Date of Birth: 03-09-1935  Today's Date: 12/17/2013 Time: 8032-1224 OT Time Calculation (min): 40 min Manual 8250-0370 (10') TherExercises 4888-9169 (14')   Visit#: 7 of 12  Re-eval: 01/01/14    Authorization: [employer]   Subjective Symptoms/Limitations Symptoms: S:  My foot is bothering me more than my shoulder now.... Pain Assessment Currently in Pain?: Yes Pain Score:  (patient states 8/10 in her right foot.  injection at MD office this week. ) Pain Location: Shoulder Pain Orientation: Right Pain Type: Acute pain   Exercise/Treatments Seated Elevation: AROM;12 reps;Weights Elevation Weight (lbs): 2#dowel Row: AROM;12 reps;Weights Row Weight (lbs): 2#dowel Protraction: AROM;12 reps;Weights Protraction Weight (lbs): 2#dowel Horizontal ABduction: AROM;12 reps;Weights Horizontal ABduction Weight (lbs): 2#dowel External Rotation: AROM;12 reps;Weights External Rotation Weight (lbs): 2#dowel Internal Rotation: AROM;12 reps;Weights Internal Rotation Weight (lbs): 2#dowel Flexion: AROM;12 reps;Weights Flexion Weight (lbs): 2#dowel Abduction: AROM;12 reps;Weights ABduction Weight (lbs): 2#dowel Other Seated Exercises: overhead press AROM x 12 reps with 2#dowel Standing External Rotation: AROM;12 reps;Theraband Theraband Level (Shoulder External Rotation): Level 2 (Red) Internal Rotation: AROM;12 reps;Theraband Theraband Level (Shoulder Internal Rotation): Level 2 (Red) Extension: AROM;12 reps;Theraband Theraband Level (Shoulder Extension): Level 2 (Red) Row: AROM;12 reps;Theraband Theraband Level (Shoulder Row): Level 2 (Red) ROM / Strengthening / Isometric Strengthening UBE (Upper Arm Bike): 2' forward, 2' backward at level 1.0       Manual Therapy Manual Therapy: Myofascial release Myofascial Release: MFR and manual to right upper arm, trapezius regions to decrease fascial  restrictions, increase joint mobility in a pain free zone  Occupational Therapy Assessment and Plan OT Assessment and Plan Clinical Impression Statement: P:  Patient arrives this date reporting much improved pain in right shoulder and increased functional use in daily activites.  She reports she is able to sleep better and that her arm is no longer waking her.  initiated seated weighted exercises as well as theraband exercises this date wtih good tolerance.  patient with complaint of min fatigue in BUE, L>R following treatment this date.   OT Plan: P:  follow up on weighted and theraband exercises, progress with strengthening.    Goals Short Term Goals Short Term Goal 1: Patient will be educated on HEP Short Term Goal 1 Progress: Met Short Term Goal 2: Patient will increase PROM to [facility] with complaint of pain </= 2/10to decrease donning/doffing pullover shirts Short Term Goal 2 Progress: Met Short Term Goal 3: Patient will increase shoulder strength to 4-/5 to increase ability to complete IADL tasks (vaccuuming, sweeping) Short Term Goal 3 Progress: Progressing toward goal Short Term Goal 4: Patient will have </=2/10 complaint of pain when completing daily activities  Short Term Goal 4 Progress: Met Short Term Goal 5: Patient will increase right grip strength by 5# to increase ability to open containers Short Term Goal 5 Progress: Progressing toward goal Short Term Goal 6: Patient will decrease fascial resttrictions to </= min to increase ability to reach overhead with greater ease  Short Term Goal 6 Progress: Met Long Term Goals Long Term Goal 1: Patient will return to highest level of indepenendence  with all daily, leisure and work activities Long Term Goal 1 Progress: Progressing toward goal Long Term Goal 2: Patient will increase AROM RUE to WNL  with pain </= 2/10  for increased independnence all daily B/IADL tasks/activities  Long Term Goal 2 Progress: Progressing toward goal Long  Term Goal 3: Patient will increase right shoulder strength to >/=  4/5  in order to lift grocery bags, perform job duties  and complete heavier IADL tasks.  Long Term Goal 3 Progress: Progressing toward goal Long Term Goal 4: Patient will demo right grip strength >/= 50# to increase ability to perform all IADL tasks, open containers with greated ease.  Long Term Goal 4 Progress: Progressing toward goal Long Term Goal 5: Patient will decrease fascial restrictions in right arm to </= trace to increase ability to reach overhead with decreased pain.  Long Term Goal 5 Progress: Progressing toward goal  Problem List Patient Active Problem List   Diagnosis Date Noted  . Right shoulder pain 11/06/2013  . Weakness of right leg 06/04/2012  . Difficulty in walking 06/04/2012    End of Session Activity Tolerance: Patient tolerated treatment well General Behavior During Therapy: [facility] for tasks assessed/performed  GO    Donney Rankins, OTR/L  12/17/2013, 9:10 AM

## 2013-12-23 ENCOUNTER — Ambulatory Visit (HOSPITAL_COMMUNITY): Payer: 59 | Admitting: Specialist

## 2013-12-23 ENCOUNTER — Ambulatory Visit (HOSPITAL_COMMUNITY)
Admission: RE | Admit: 2013-12-23 | Discharge: 2013-12-23 | Disposition: A | Discharge status: Home / Self Care | Payer: 59 | Source: Ambulatory Visit | Attending: Internal Medicine | Admitting: Internal Medicine

## 2013-12-23 DIAGNOSIS — M25519 Pain in unspecified shoulder: Secondary | ICD-10-CM | POA: Diagnosis not present

## 2013-12-23 DIAGNOSIS — M6281 Muscle weakness (generalized): Secondary | ICD-10-CM | POA: Insufficient documentation

## 2013-12-23 DIAGNOSIS — IMO0001 Reserved for inherently not codable concepts without codable children: Secondary | ICD-10-CM | POA: Insufficient documentation

## 2013-12-23 NOTE — Progress Notes (Addendum)
Occupational Therapy Treatment Patient Details  Name: Danielle Oconnell MRN: [id number] Date of Birth: [birth year]  Today's Date: 12/23/2013 Time: 7939-0300 OT Time Calculation (min): 41 min TherExercises 9233-0076  Visit#: 8 of 12  Re-eval: 01/01/14    Authorization: UMR   Subjective Symptoms/Limitations Symptoms: S:  I really am not having any painin my shoulder... still my foot more than anything Pain Assessment Currently in Pain?: No/denies  Exercise/Treatments Standing Horizontal ABduction: AROM;15 reps;Theraband Theraband Level (Shoulder Horizontal ABduction): Level 2 (Red) External Rotation: AROM;15 reps;Theraband Theraband Level (Shoulder External Rotation): Level 2 (Red) Internal Rotation: AROM;15 reps;Theraband Theraband Level (Shoulder Internal Rotation): Level 2 (Red) Flexion: AROM;15 reps;Theraband;12 reps;Weights Theraband Level (Shoulder Flexion): Level 2 (Red) Shoulder Flexion Weight (lbs): 3# dowel, back to wall ABduction: AROM;15 reps;Theraband Theraband Level (Shoulder ABduction): Level 2 (Red) Extension: AROM;15 reps;Theraband Theraband Level (Shoulder Extension): Level 2 (Red) Row: AROM;15 reps;Theraband;12 reps;Weights Theraband Level (Shoulder Row): Level 2 (Red) Row Weight (lbs): 3# dowel, back to wall  Other Standing Exercises: D1 x 15 reps red theraband Other Standing Exercises: circles (counter/clockwise) each x 12 reps back to wall, 3# dowel    ROM / Strengthening / Isometric Strengthening UBE (Upper Arm Bike): 3' forward, 3'backward at level 2.0 "W" Arms: x12 reps X to V Arms: x12 reps  Ball on Wall: 1' each flexion and abduction       Occupational Therapy Assessment and Plan OT Assessment and Plan Clinical Impression Statement: A: Patient tolerated increased strengthening this date and increase in weighted exercises this date with min complaint of muscle pull in bicep area during abduction and D1 exercises only and complaint of min/mod  fatigue at end of treatment.  Issued red theraband and exercise pages/handouts for home this date with good return demo and verbalized good understanding.   OT Plan: P:  progress wth strengthening, follow up on theraband handout HEP   Goals Short Term Goals Short Term Goal 1: Patient will be educated on HEP Short Term Goal 1 Progress: Met Short Term Goal 2: Patient will increase PROM to [facility] with complaint of pain </= 2/10to decrease donning/doffing pullover shirts Short Term Goal 2 Progress: Met Short Term Goal 3: Patient will increase shoulder strength to 4-/5 to increase ability to complete IADL tasks (vaccuuming, sweeping) Short Term Goal 3 Progress: Progressing toward goal Short Term Goal 4: Patient will have </=2/10 complaint of pain when completing daily activities  Short Term Goal 4 Progress: Met Short Term Goal 5: Patient will increase right grip strength by 5# to increase ability to open containers Short Term Goal 5 Progress: Progressing toward goal Short Term Goal 6: Patient will decrease fascial resttrictions to </= min to increase ability to reach overhead with greater ease  Short Term Goal 6 Progress: Met Long Term Goals Long Term Goal 1: Patient will return to highest level of indepenendence  with all daily, leisure and work activities Long Term Goal 1 Progress: Progressing toward goal Long Term Goal 2: Patient will increase AROM RUE to WNL  with pain </= 2/10  for increased independnence all daily B/IADL tasks/activities  Long Term Goal 2 Progress: Progressing toward goal Long Term Goal 3: Patient will increase right shoulder strength to >/= 4/5  in order to lift grocery bags, perform job duties  and complete heavier IADL tasks.  Long Term Goal 3 Progress: Progressing toward goal Long Term Goal 4: Patient will demo right grip strength >/= 50# to increase ability to perform all IADL tasks, open containers with greated  ease.  Long Term Goal 4 Progress: Progressing toward  goal Long Term Goal 5: Patient will decrease fascial restrictions in right arm to </= trace to increase ability to reach overhead with decreased pain.  Long Term Goal 5 Progress: Progressing toward goal  Problem List Patient Active Problem List   Diagnosis Date Noted  . Right shoulder pain 11/06/2013  . Weakness of right leg 06/04/2012  . Difficulty in walking 06/04/2012    End of Session Activity Tolerance: Patient tolerated treatment well General Behavior During Therapy: [facility] for tasks assessed/performed  GO    Donney Rankins, OTR/L  12/23/2013, 9:26 AM

## 2013-12-25 ENCOUNTER — Ambulatory Visit (HOSPITAL_COMMUNITY): Payer: 59 | Admitting: Specialist

## 2013-12-26 ENCOUNTER — Ambulatory Visit (HOSPITAL_COMMUNITY)
Admission: RE | Admit: 2013-12-26 | Discharge: 2013-12-26 | Disposition: A | Discharge status: Home / Self Care | Payer: 59 | Source: Ambulatory Visit | Attending: Internal Medicine | Admitting: Internal Medicine

## 2013-12-26 NOTE — Progress Notes (Signed)
Occupational Therapy Treatment Patient Details  Name: Danielle Oconnell MRN: [id number] Date of Birth: [birth year]  Today's Date: 12/26/2013 Time: 8101-7510 OT Time Calculation (min): 25 min Therapeutic exercises 25' Visit#: 9 of 12  Re-eval: 01/01/14    Subjective S:  Its feeling pretty good.  Im not having any trouble doing my job. Pain Assessment Currently in Pain?: No/denies Pain Score: 0-No pain  Exercise/Treatments Standing Protraction: Strengthening;15 reps Protraction Weight (lbs): 1 Horizontal ABduction: Strengthening;15 reps Horizontal ABduction Weight (lbs): 1 External Rotation: Strengthening;15 reps External Rotation Weight (lbs): 1 Internal Rotation: Strengthening;15 reps Internal Rotation Weight (lbs): 1 Flexion: Strengthening;15 reps Shoulder Flexion Weight (lbs): 1 ABduction: Strengthening;15 reps Shoulder ABduction Weight (lbs): 1 ROM / Strengthening / Isometric Strengthening UBE (Upper Arm Bike): 3' and 3' 3.0 "W" Arms: 15 times 1# X to V Arms: 15 times 1# Ball on Wall: 10 times with 1#          Occupational Therapy Assessment and Plan OT Assessment and Plan Clinical Impression Statement: A:  Added weights to standing exercises.  Patient demonstated good form.  OT Plan: P:  Review HEP and dc.   Goals Short Term Goals Short Term Goal 1: Patient will be educated on HEP Short Term Goal 2: Patient will increase PROM to [facility] with complaint of pain </= 2/10to decrease donning/doffing pullover shirts Short Term Goal 3: Patient will increase shoulder strength to 4-/5 to increase ability to complete IADL tasks (vaccuuming, sweeping) Short Term Goal 4: Patient will have </=2/10 complaint of pain when completing daily activities  Short Term Goal 5: Patient will increase right grip strength by 5# to increase ability to open containers Short Term Goal 6: Patient will decrease fascial resttrictions to </= min to increase ability to reach overhead with greater ease   Long Term Goals Long Term Goal 1: Patient will return to highest level of indepenendence  with all daily, leisure and work activities Long Term Goal 2: Patient will increase AROM RUE to WNL  with pain </= 2/10  for increased independnence all daily B/IADL tasks/activities  Long Term Goal 3: Patient will increase right shoulder strength to >/= 4/5  in order to lift grocery bags, perform job duties  and complete heavier IADL tasks.  Long Term Goal 4: Patient will demo right grip strength >/= 50# to increase ability to perform all IADL tasks, open containers with greated ease.  Long Term Goal 5: Patient will decrease fascial restrictions in right arm to </= trace to increase ability to reach overhead with decreased pain.   Problem List Patient Active Problem List   Diagnosis Date Noted  . Right shoulder pain 11/06/2013  . Weakness of right leg 06/04/2012  . Difficulty in walking 06/04/2012    End of Session Activity Tolerance: Patient tolerated treatment well General Behavior During Therapy: [facility] for tasks assessed/performed  GO    Vangie Bicker, OTR/L  12/26/2013, 8:43 AM

## 2013-12-30 ENCOUNTER — Ambulatory Visit (HOSPITAL_COMMUNITY): Payer: 59 | Admitting: Specialist

## 2013-12-30 ENCOUNTER — Ambulatory Visit (HOSPITAL_COMMUNITY)
Admission: RE | Admit: 2013-12-30 | Discharge: 2013-12-30 | Disposition: A | Discharge status: Home / Self Care | Payer: 59 | Source: Ambulatory Visit | Attending: Internal Medicine | Admitting: Internal Medicine

## 2013-12-30 NOTE — Evaluation (Addendum)
Occupational Therapy Discharge   Patient Details  Name: Danielle Oconnell MRN: [id number] Date of Birth: [birth year]  Today's Date: 12/30/2013 Time: 3559-7416 OT Time Calculation (min): 26 min Reassessment 0804-0820 (16') FOTO (no charge)  [phone] (10')  Visit#: 10 of 12  Re-eval:    Assessment Diagnosis: Pain in right shoulder   Authorization: [employer]  Authorization Time Period:    Authorization Visit#:   of     Past Medical History: No past medical history on file. Past Surgical History: No past surgical history on file.  Subjective Symptoms/Limitations Symptoms: S:  It dont really hurt it is just a little  sore. Pain Assessment Currently in Pain?: No/denies  Precautions/Restrictions  Precautions Precautions: None Restrictions Weight Bearing Restrictions: No  Balance Screening Balance Screen Has the patient fallen in the past 6 months: No  Prior [place] expects to be discharged to:: Private residence  Assessment ADL/Vision/Perception ADL ADL Comments: Patietn reports she she is able to do all tasks now pain free including all job duties.  Dominant Hand: Right Vision - History Baseline Vision: Wears glasses all the time  Cognition/Observation Cognition Overall Cognitive Status: Within Functional Limits for tasks assessed  Sensation/Coordination/Edema Sensation Light Touch: Appears Intact Additional Comments: cont to wear splints on hands at night for carpal tunnel  Coordination Gross Motor Movements are Fluid and Coordinated: Yes Fine Motor Movements are Fluid and Coordinated: Yes  Additional Assessments RUE AROM (degrees) RUE Overall AROM Comments: assessed in standing  Right Shoulder Flexion: 155 Degrees (140) Right Shoulder ABduction: 159 Degrees (155) Right Shoulder Internal Rotation: 90 Degrees (90) Right Shoulder External Rotation: 70 Degrees (68) RUE Strength RUE Overall Strength Comments: cont with complaint of  pain 4/10 with ER assessment  Right Shoulder Flexion:  (4+/5 (3/5)) Right Shoulder ABduction: 4/5 (3+/5) Right Shoulder Internal Rotation:  (4+/5 (4/5)) Right Shoulder External Rotation: 3+/5 (3-/5) Grip (lbs): 45 (32) LUE Strength Grip (lbs): 43 (48 ) Palpation Palpation: trace fascial restrictions in rightupper arm, trap and scapular regions       Occupational Therapy Assessment and Plan OT Assessment and Plan Clinical Impression Statement: A: Reassessment for discharge this date. Patient has met 5/6 STG and 4/5 LTG this date only not meeting ER strength goal and grip strength of RUE >/=50# which patient is at 45#.  Patient is independent with her HEP and reports good follow through, no questions or need for review per patient. She is able to complete all of her B/IADL, work and leisure activities with only complaint of soreness in her bicep at times with lifting.  Patient in agreement with discharge.   Instructed patient to cont with HEP.   OT Plan: P:  Discharge from skilled OT services at this time.   Recommend cont with HEP    Goals Short Term Goals Short Term Goal 1: Patient will be educated on HEP Short Term Goal 1 Progress: Met Short Term Goal 2: Patient will increase PROM to [facility] with complaint of pain </= 2/10to decrease donning/doffing pullover shirts Short Term Goal 2 Progress: Met Short Term Goal 3 Progress: Partly met Short Term Goal 4: Patient will have </=2/10 complaint of pain when completing daily activities  Short Term Goal 4 Progress: Met Short Term Goal 5: Patient will increase right grip strength by 5# to increase ability to open containers Short Term Goal 5 Progress: Met Short Term Goal 6: Patient will decrease fascial resttrictions to </= min to increase ability to reach overhead with greater ease  Short Term Goal 6 Progress: Met Long Term Goals Long Term Goal 1: Patient will return to highest level of indepenendence  with all daily, leisure and work  activities Long Term Goal 1 Progress: Met Long Term Goal 2: Patient will increase AROM RUE to WNL  with pain </= 2/10  for increased independnence all daily B/IADL tasks/activities  Long Term Goal 2 Progress: Met Long Term Goal 3: Patient will increase right shoulder strength to >/= 4/5  in order to lift grocery bags, perform job duties  and complete heavier IADL tasks.  Long Term Goal 3 Progress: Partly met Long Term Goal 4 Progress: Partly met Long Term Goal 5 Progress: Met  Problem List Patient Active Problem List   Diagnosis Date Noted  . Right shoulder pain 11/06/2013  . Weakness of right leg 06/04/2012  . Difficulty in walking 06/04/2012    End of Session Activity Tolerance: Patient tolerated treatment well General Behavior During Therapy: [facility] for tasks assessed/performed  GO Functional Assessment Tool Used: FOTO 67/100 (initial 39/100, update 50/100)  Donney Rankins, OTR/L  12/30/2013, 8:45 AM  Physician Documentation Your signature is required to indicate approval of the treatment plan as stated above.  Please sign and either send electronically or make a copy of this report for your files and return this physician signed original.  Please mark one 1.__approve of plan  2. ___approve of plan with the following conditions.   ______________________________                                                          _____________________ Physician Signature                                                                                                             Date

## 2014-01-01 ENCOUNTER — Ambulatory Visit (HOSPITAL_COMMUNITY): Payer: 59 | Admitting: Specialist

## 2014-01-02 ENCOUNTER — Ambulatory Visit (HOSPITAL_COMMUNITY): Payer: 59 | Admitting: Occupational Therapy

## 2014-01-06 ENCOUNTER — Ambulatory Visit (HOSPITAL_COMMUNITY): Payer: 59 | Admitting: Specialist

## 2014-01-08 ENCOUNTER — Ambulatory Visit (HOSPITAL_COMMUNITY): Payer: 59 | Admitting: Specialist

## 2014-04-29 DIAGNOSIS — M722 Plantar fascial fibromatosis: Secondary | ICD-10-CM | POA: Diagnosis not present

## 2014-05-20 DIAGNOSIS — E785 Hyperlipidemia, unspecified: Secondary | ICD-10-CM | POA: Diagnosis not present

## 2014-05-25 DIAGNOSIS — E041 Nontoxic single thyroid nodule: Secondary | ICD-10-CM | POA: Diagnosis not present

## 2014-05-25 DIAGNOSIS — E042 Nontoxic multinodular goiter: Secondary | ICD-10-CM | POA: Diagnosis not present

## 2014-07-21 NOTE — Addendum Note (Signed)
Encounter addended by: Arbutus Ped, OTR on: 07/21/2014 11:34 AM<BR>     Documentation filed: Clinical Notes

## 2014-07-28 NOTE — Addendum Note (Signed)
Encounter addended by: Arbutus Ped, OTR on: 07/28/2014  5:34 PM<BR>     Documentation filed: Clinical Notes, Flowsheet VN

## 2014-08-11 DIAGNOSIS — R05 Cough: Secondary | ICD-10-CM | POA: Diagnosis not present

## 2014-08-11 DIAGNOSIS — J309 Allergic rhinitis, unspecified: Secondary | ICD-10-CM | POA: Diagnosis not present

## 2014-08-11 DIAGNOSIS — R059 Cough, unspecified: Secondary | ICD-10-CM | POA: Diagnosis not present

## 2014-08-11 DIAGNOSIS — T7800XA Anaphylactic reaction due to unspecified food, initial encounter: Secondary | ICD-10-CM | POA: Diagnosis not present

## 2014-08-18 DIAGNOSIS — J310 Chronic rhinitis: Secondary | ICD-10-CM | POA: Diagnosis not present

## 2014-09-15 DIAGNOSIS — J310 Chronic rhinitis: Secondary | ICD-10-CM | POA: Diagnosis not present

## 2014-12-21 DIAGNOSIS — G5601 Carpal tunnel syndrome, right upper limb: Secondary | ICD-10-CM | POA: Diagnosis not present

## 2015-02-07 ENCOUNTER — Encounter (HOSPITAL_COMMUNITY): Payer: Self-pay | Admitting: Nurse Practitioner

## 2015-02-07 ENCOUNTER — Emergency Department (HOSPITAL_COMMUNITY): Payer: 59

## 2015-02-07 ENCOUNTER — Emergency Department (HOSPITAL_COMMUNITY)
Admission: EM | Admit: 2015-02-07 | Discharge: 2015-02-07 | Disposition: A | Discharge status: Home / Self Care | Payer: 59 | Attending: Emergency Medicine | Admitting: Emergency Medicine

## 2015-02-07 DIAGNOSIS — R1010 Upper abdominal pain, unspecified: Secondary | ICD-10-CM | POA: Insufficient documentation

## 2015-02-07 DIAGNOSIS — R110 Nausea: Secondary | ICD-10-CM | POA: Diagnosis present

## 2015-02-07 DIAGNOSIS — R1012 Left upper quadrant pain: Secondary | ICD-10-CM | POA: Diagnosis not present

## 2015-02-07 DIAGNOSIS — K589 Irritable bowel syndrome without diarrhea: Secondary | ICD-10-CM | POA: Diagnosis not present

## 2015-02-07 DIAGNOSIS — R197 Diarrhea, unspecified: Secondary | ICD-10-CM | POA: Insufficient documentation

## 2015-02-07 DIAGNOSIS — R1011 Right upper quadrant pain: Secondary | ICD-10-CM | POA: Diagnosis not present

## 2015-02-07 DIAGNOSIS — Z79899 Other long term (current) drug therapy: Secondary | ICD-10-CM | POA: Insufficient documentation

## 2015-02-07 HISTORY — DX: Irritable bowel syndrome, unspecified: K58.9

## 2015-02-07 LAB — URINALYSIS, ROUTINE W REFLEX MICROSCOPIC
Bilirubin Urine: NEGATIVE
Glucose, UA: NEGATIVE mg/dL
Ketones, ur: NEGATIVE mg/dL
Nitrite: NEGATIVE
Protein, ur: NEGATIVE mg/dL
Specific Gravity, Urine: 1.022 (ref 1.005–1.030)
Urobilinogen, UA: 0.2 mg/dL (ref 0.0–1.0)
pH: 6 (ref 5.0–8.0)

## 2015-02-07 LAB — CBC WITH DIFFERENTIAL/PLATELET
Basophils Absolute: 0 10*3/uL (ref 0.0–0.1)
Basophils Relative: 0 % (ref 0–1)
Eosinophils Absolute: 0.1 10*3/uL (ref 0.0–0.7)
Eosinophils Relative: 1 % (ref 0–5)
HCT: 41.8 % (ref 36.0–46.0)
Hemoglobin: 13.5 g/dL (ref 12.0–15.0)
Lymphocytes Relative: 21 % (ref 12–46)
Lymphs Abs: 2.2 10*3/uL (ref 0.7–4.0)
MCH: 29.3 pg (ref 26.0–34.0)
MCHC: 32.3 g/dL (ref 30.0–36.0)
MCV: 90.9 fL (ref 78.0–100.0)
Monocytes Absolute: 0.6 10*3/uL (ref 0.1–1.0)
Monocytes Relative: 6 % (ref 3–12)
Neutro Abs: 7.5 10*3/uL (ref 1.7–7.7)
Neutrophils Relative %: 72 % (ref 43–77)
Platelets: 445 10*3/uL — ABNORMAL HIGH (ref 150–400)
RBC: 4.6 MIL/uL (ref 3.87–5.11)
RDW: 13.9 % (ref 11.5–15.5)
WBC: 10.3 10*3/uL (ref 4.0–10.5)

## 2015-02-07 LAB — COMPREHENSIVE METABOLIC PANEL
ALT: 12 U/L (ref 0–35)
AST: 18 U/L (ref 0–37)
Albumin: 4.3 g/dL (ref 3.5–5.2)
Alkaline Phosphatase: 63 U/L (ref 39–117)
Anion gap: 7 (ref 5–15)
BUN: 9 mg/dL (ref 6–23)
CO2: 29 mmol/L (ref 19–32)
Calcium: 9.6 mg/dL (ref 8.4–10.5)
Chloride: 106 mmol/L (ref 96–112)
Creatinine, Ser: 0.78 mg/dL (ref 0.50–1.10)
GFR calc Af Amer: >90 mL/min (ref >90)
GFR calc non Af Amer: 79 mL/min — ABNORMAL LOW (ref >90)
Glucose, Bld: 107 mg/dL — ABNORMAL HIGH (ref 70–99)
Potassium: 3.4 mmol/L — ABNORMAL LOW (ref 3.5–5.1)
Sodium: 142 mmol/L (ref 135–145)
Total Bilirubin: 0.5 mg/dL (ref 0.3–1.2)
Total Protein: 6.9 g/dL (ref 6.0–8.3)

## 2015-02-07 LAB — URINE MICROSCOPIC-ADD ON

## 2015-02-07 LAB — LIPASE, BLOOD: Lipase: 29 U/L (ref 11–59)

## 2015-02-07 LAB — I-STAT CG4 LACTIC ACID, ED: Lactic Acid, Venous: 1.29 mmol/L (ref 0.5–2.0)

## 2015-02-07 MED ORDER — ONDANSETRON HCL 4 MG/2ML IJ SOLN
4.0000 mg | Freq: Once | INTRAMUSCULAR | Status: AC
  Administered 2015-02-07: 4 mg via INTRAVENOUS
  Filled 2015-02-07: qty 2

## 2015-02-07 MED ORDER — SODIUM CHLORIDE 0.9 % IV BOLUS (SEPSIS)
1000.0000 mL | Freq: Once | INTRAVENOUS | Status: AC
  Administered 2015-02-07: 1000 mL via INTRAVENOUS

## 2015-02-07 MED ORDER — DICYCLOMINE HCL 10 MG/ML IM SOLN
20.0000 mg | Freq: Once | INTRAMUSCULAR | Status: AC
  Administered 2015-02-07: 20 mg via INTRAMUSCULAR
  Filled 2015-02-07: qty 2

## 2015-02-07 MED ORDER — FENTANYL CITRATE 0.05 MG/ML IJ SOLN
50.0000 ug | Freq: Once | INTRAMUSCULAR | Status: AC
  Administered 2015-02-07: 50 ug via INTRAVENOUS
  Filled 2015-02-07: qty 2

## 2015-02-07 MED ORDER — IOHEXOL 300 MG/ML  SOLN
100.0000 mL | Freq: Once | INTRAMUSCULAR | Status: AC | PRN
  Administered 2015-02-07: 100 mL via INTRAVENOUS

## 2015-02-07 MED ORDER — KETOROLAC TROMETHAMINE 30 MG/ML IJ SOLN
15.0000 mg | Freq: Once | INTRAMUSCULAR | Status: AC
  Administered 2015-02-07: 15 mg via INTRAVENOUS
  Filled 2015-02-07: qty 1

## 2015-02-07 MED ORDER — ONDANSETRON HCL 4 MG PO TABS: 4.0000 mg | ORAL_TABLET | Freq: Four times a day (QID) | ORAL | Status: DC

## 2015-02-07 MED ORDER — DICYCLOMINE HCL 20 MG PO TABS: 20.0000 mg | ORAL_TABLET | Freq: Two times a day (BID) | ORAL | Status: DC

## 2015-02-07 NOTE — ED Notes (Signed)
Transported to CT by CT Tech

## 2015-02-07 NOTE — ED Provider Notes (Signed)
CSN: [id number]     Arrival date & time 02/07/15  1636 History   First MD Initiated Contact with Patient 02/07/15 1815     Chief Complaint  Patient presents with  . Abdominal Pain  . Nausea     (Consider location/radiation/quality/duration/timing/severity/associated sxs/prior Treatment) HPI Comments: Patient is a 80 year old female with a history of IBS who presents to the emergency department for further evaluation of abdominal pain. Patient states that she had one episode of watery diarrhea just before 1500 today. She states that her pain in her abdomen began shortly after. She describes the pain as cramping and aching, present along her upper abdomen as long as down her right side. She denies any modifying factors of her symptoms and no meds PTA, though she reports taking Amitiza for her IBS. Patient reports associated nausea without emesis. No flatus since 1500, per patient. No associated fever, chest pain, shortness of breath, dysuria, hematuria, vomiting, melena or hematochezia, syncope. Patient reports an abdominal surgical history significant for hysterectomy and left oophorectomy as well as appendectomy.  Patient is a 80 y.o. female presenting with abdominal pain. The history is provided by the patient. No language interpreter was used.  Abdominal Pain Associated symptoms: diarrhea and nausea   Associated symptoms: no chest pain, no dysuria, no fever, no hematuria, no shortness of breath and no vomiting     Past Medical History  Diagnosis Date  . Irritable bowel syndrome    Past Surgical History  Procedure Laterality Date  . Hysterotomy     History reviewed. No pertinent family history. History  Substance Use Topics  . Smoking status: Never Smoker   . Smokeless tobacco: Not on file  . Alcohol Use: No   OB History    No data available      Review of Systems  Constitutional: Negative for fever.  Respiratory: Negative for shortness of breath.   Cardiovascular: Negative  for chest pain.  Gastrointestinal: Positive for nausea, abdominal pain and diarrhea. Negative for vomiting.  Genitourinary: Negative for dysuria and hematuria.  Neurological: Negative for syncope.  All other systems reviewed and are negative.   Allergies  Sulfa antibiotics  Home Medications   Prior to Admission medications   Medication Sig Start Date End Date Taking? Authorizing Provider  Azelastine-Fluticasone 137-50 MCG/ACT SUSP Place 1 spray into the nose daily as needed (allergies).   Yes Historical Provider, MD  Cascara Sagrada 450 MG CAPS Take 450 mg by mouth 2 (two) times daily as needed (stool softner).   Yes Historical Provider, MD  guaiFENesin (MUCINEX) 600 MG 12 hr tablet Take 600 mg by mouth daily.   Yes Historical Provider, MD  lubiprostone (AMITIZA) 24 MCG capsule Take 24 mcg by mouth 2 (two) times daily with a meal.   Yes Historical Provider, MD  Polyethyl Glycol-Propyl Glycol 0.4-0.3 % SOLN Place 1 drop into both eyes 3 (three) times daily as needed (dry eyes).   Yes Historical Provider, MD  Probiotic Product (ALIGN) 4 MG CAPS Take 4 mg by mouth daily.   Yes Historical Provider, MD   BP 127/68 mmHg  Pulse 81  Temp(Src) 97.8 F (36.6 C) (Oral)  Resp 18  SpO2 99%   Physical Exam  Constitutional: She is oriented to person, place, and time. She appears well-developed and well-nourished. No distress.  Nontoxic/nonseptic appearing  HENT:  Head: Normocephalic and atraumatic.  Eyes: Conjunctivae and EOM are normal. No scleral icterus.  Neck: Normal range of motion.  Cardiovascular: Normal rate, regular rhythm  and intact distal pulses.   Pulmonary/Chest: Effort normal and breath sounds normal. No respiratory distress. She has no wheezes. She has no rales.  Respirations even and unlabored. Lungs CTAB.  Abdominal: Soft. She exhibits no mass. There is tenderness. There is no rebound and no guarding.  Soft abdomen with mild TTP in the upper abdomen. No distinct areas of  focal TTP. No peritoneal signs. Abdomen is soft. No masses.  Musculoskeletal: Normal range of motion.  Neurological: She is alert and oriented to person, place, and time. She exhibits normal muscle tone. Coordination normal.  Patient moves extremities without ataxia. GCS 15.  Skin: Skin is warm and dry. No rash noted. She is not diaphoretic. No erythema. No pallor.  Psychiatric: She has a normal mood and affect. Her behavior is normal.  Nursing note and vitals reviewed.   ED Course  Procedures (including critical care time) Labs Review Labs Reviewed  CBC WITH DIFFERENTIAL/PLATELET - Abnormal; Notable for the following:    Platelets 445 (*)    All other components within normal limits  COMPREHENSIVE METABOLIC PANEL - Abnormal; Notable for the following:    Potassium 3.4 (*)    Glucose, Bld 107 (*)    GFR calc non Af Amer 79 (*)    All other components within normal limits  URINALYSIS, ROUTINE W REFLEX MICROSCOPIC - Abnormal; Notable for the following:    Hgb urine dipstick TRACE (*)    Leukocytes, UA SMALL (*)    All other components within normal limits  LIPASE, BLOOD  URINE MICROSCOPIC-ADD ON  I-STAT CG4 LACTIC ACID, ED    Imaging Review Ct Abdomen Pelvis W Contrast  02/07/2015   CLINICAL DATA:  Acute onset of generalized abdominal cramping, radiating to the back. Nausea. Initial encounter.  EXAM: CT ABDOMEN AND PELVIS WITH CONTRAST  TECHNIQUE: Multidetector CT imaging of the abdomen and pelvis was performed using the standard protocol following bolus administration of intravenous contrast.  CONTRAST:  119mL OMNIPAQUE IOHEXOL 300 MG/ML  SOLN  COMPARISON:  Lumbar spine radiographs performed 05/07/2012  FINDINGS: The visualized lung bases are clear.  The liver and spleen are unremarkable in appearance. The gallbladder is within normal limits. The pancreas and adrenal glands are unremarkable. Focal extension of fat along the body of the pancreas is thought to remain within normal  limits.  The kidneys are unremarkable in appearance. There is no evidence of hydronephrosis. No renal or ureteral stones are seen. No perinephric stranding is appreciated.  A small amount of free fluid is seen within the pelvis. There is minimal haziness about visualized distal small bowel loops, which given the patient's history of irritable bowel syndrome, could reflect mild acute inflammation. Visualized small bowel loops are partially filled with fluid, without significant distention. The stomach is within normal limits. No acute vascular abnormalities are seen.  The appendix is not definitely seen; there is no evidence of appendicitis. The colon is unremarkable in appearance, aside from minimal diverticulosis along the ascending colon.  The bladder is mildly distended and grossly unremarkable in appearance. The patient is status post hysterectomy. No suspicious adnexal masses are seen. Scattered calcification is noted within the right ovary. No inguinal lymphadenopathy is seen.  No acute osseous abnormalities are identified. Vacuum phenomenon is incidentally noted at L5-S1; seen in isolation, this is a normal finding. Sclerotic change is noted at the sacroiliac joints.  IMPRESSION: Minimal haziness about visualized distal small bowel loops, which given the patient's history of irritable bowel syndrome, could reflect mild acute  inflammation. No evidence of obstruction or significant inflammation. Small amount of free fluid seen in the pelvis. Otherwise unremarkable CT of the abdomen and pelvis.   Electronically Signed   By: Garald Balding M.D.   On: 02/07/2015 20:36     EKG Interpretation None      MDM   Final diagnoses:  Pain of upper abdomen    80 year old female presents to the emergency department for further evaluation of abdominal pain. Patient is well and nontoxic appearing as well as hemodynamically stable and afebrile. Laboratory workup is reassuring. Patient has no leukocytosis or left  shift. Her electrolytes showed no abnormalities. Liver and kidney function preserved. Lactate normal. Urinalysis also does not suggest infection.  Given patient's age and history of IBS and abdominal surgeries, CT abdomen pelvis obtained which shows no emergent findings. There may be some areas to the distal small bowel loops which reflect mild acute inflammation, but patient does not have a surgical abdomen to suggest need for any additional workup at this time. Given her history of IBS, will manage with Zofran and Bentyl. Patient on daily Amitiza. She has been recommended to follow-up with her primary care doctor as well as her gastroenterologist. Return precautions discussed and provided. Patient agreeable to plan with no unaddressed concerns. Patient seen also by Dr. Tomi Bamberger who is in agreement with this workup, assessment, management plan, and patient's stability for discharge.   Filed Vitals:   02/07/15 1957 02/07/15 2053 02/07/15 2142 02/07/15 2155  BP: 127/68 123/62 122/70 117/60  Pulse: 81 85 92 87  Temp:    98.7 F (37.1 C)  TempSrc:    Oral  Resp: 18 16 16 17   SpO2: 99% 97% 98% 98%     Antonietta Breach, PA-C 02/08/15 0107  Dorie Rank, MD 02/08/15 [phone]

## 2015-02-07 NOTE — ED Notes (Signed)
Pt c/o abdominal cramping radiating to the back, states it could be a flare up of her Irritable Bowel Syndrome. Denies vomiting but states she is nauseated, rates RUQ and LUQ pain 10/10. Denies shortness of breath or chest pain.

## 2015-02-07 NOTE — Discharge Instructions (Signed)
Irritable Bowel Syndrome Irritable bowel syndrome (IBS) is caused by a disturbance of normal bowel function and is a common digestive disorder. You may also hear this condition called spastic colon, mucous colitis, and irritable colon. There is no cure for IBS. However, symptoms often gradually improve or disappear with a good diet, stress management, and medicine. This condition usually appears in late adolescence or early adulthood. Women develop it twice as often as men. CAUSES  After food has been digested and absorbed in the small intestine, waste material is moved into the large intestine, or colon. In the colon, water and salts are absorbed from the undigested products coming from the small intestine. The remaining residue, or fecal material, is held for elimination. Under normal circumstances, gentle, rhythmic contractions of the bowel walls push the fecal material along the colon toward the rectum. In IBS, however, these contractions are irregular and poorly coordinated. The fecal material is either retained too long, resulting in constipation, or expelled too soon, producing diarrhea. SIGNS AND SYMPTOMS  The most common symptom of IBS is abdominal pain. It is often in the lower left side of the abdomen, but it may occur anywhere in the abdomen. The pain comes from spasms of the bowel muscles happening too much and from the buildup of gas and fecal material in the colon. This pain:  Can range from sharp abdominal cramps to a dull, continuous ache.  Often worsens soon after eating.  Is often relieved by having a bowel movement or passing gas. Abdominal pain is usually accompanied by constipation, but it may also produce diarrhea. The diarrhea often occurs right after a meal or upon waking up in the morning. The stools are often soft, watery, and flecked with mucus. Other symptoms of IBS include:  Bloating.  Loss of appetite.  Heartburn.  Backache.  Dull pain in the arms or  shoulders.  Nausea.  Burping.  Vomiting.  Gas. IBS may also cause symptoms that are unrelated to the digestive system, such as:  Fatigue.  Headaches.  Anxiety.  Shortness of breath.  Trouble concentrating.  Dizziness. These symptoms tend to come and go. DIAGNOSIS  The symptoms of IBS may seem like symptoms of other, more serious digestive disorders. Your health care provider may want to perform tests to exclude these disorders.  TREATMENT Many medicines are available to help correct bowel function or relieve bowel spasms and abdominal pain. Among the medicines available are:  Laxatives for severe constipation and to help restore normal bowel habits.  Specific antidiarrheal medicines to treat severe or lasting diarrhea.  Antispasmodic agents to relieve intestinal cramps. Your health care provider may also decide to treat you with a mild tranquilizer or sedative during unusually stressful periods in your life. Your health care provider may also prescribe antidepressant medicine. The use of this medicine has been shown to reduce pain and other symptoms of IBS. Remember that if any medicine is prescribed for you, you should take it exactly as directed. Make sure your health care provider knows how well it worked for you. HOME CARE INSTRUCTIONS   Take all medicines as directed by your health care provider.  Avoid foods that are high in fat or oils, such as heavy cream, butter, frankfurters, sausage, and other fatty meats.  Avoid foods that make you go to the bathroom, such as fruit, fruit juice, and dairy products.  Cut out carbonated drinks, chewing gum, and "gassy" foods such as beans and cabbage. This may help relieve bloating and burping.    Eat foods with bran, and drink plenty of liquids with the bran foods. This helps relieve constipation.  Keep track of what foods seem to bring on your symptoms.  Avoid emotionally charged situations or circumstances that produce  anxiety.  Start or continue exercising.  Get plenty of rest and sleep. Document Released: 11/06/2005 Document Revised: 11/11/2013 Document Reviewed: 06/26/2008 Exit[employer] Patient Information 2015 [employer]. This information is not intended to replace advice given to you by your health care provider. Make sure you discuss any questions you have with your health care provider.  

## 2015-02-10 ENCOUNTER — Inpatient Hospital Stay (HOSPITAL_COMMUNITY): Payer: 59

## 2015-02-10 ENCOUNTER — Inpatient Hospital Stay (HOSPITAL_COMMUNITY)
Admission: EM | Admit: 2015-02-10 | Discharge: 2015-02-16 | DRG: 336 | Disposition: A | Discharge status: Home / Self Care | Payer: 59 | Attending: Internal Medicine | Admitting: Internal Medicine

## 2015-02-10 ENCOUNTER — Emergency Department (HOSPITAL_COMMUNITY): Payer: 59

## 2015-02-10 ENCOUNTER — Encounter (HOSPITAL_COMMUNITY): Payer: Self-pay | Admitting: Emergency Medicine

## 2015-02-10 DIAGNOSIS — R188 Other ascites: Secondary | ICD-10-CM | POA: Diagnosis present

## 2015-02-10 DIAGNOSIS — Z79899 Other long term (current) drug therapy: Secondary | ICD-10-CM | POA: Diagnosis not present

## 2015-02-10 DIAGNOSIS — Z6832 Body mass index (BMI) 32.0-32.9, adult: Secondary | ICD-10-CM | POA: Diagnosis not present

## 2015-02-10 DIAGNOSIS — R1084 Generalized abdominal pain: Secondary | ICD-10-CM | POA: Diagnosis not present

## 2015-02-10 DIAGNOSIS — K5669 Other intestinal obstruction: Secondary | ICD-10-CM | POA: Diagnosis not present

## 2015-02-10 DIAGNOSIS — Z8249 Family history of ischemic heart disease and other diseases of the circulatory system: Secondary | ICD-10-CM

## 2015-02-10 DIAGNOSIS — R140 Abdominal distension (gaseous): Secondary | ICD-10-CM | POA: Insufficient documentation

## 2015-02-10 DIAGNOSIS — K598 Other specified functional intestinal disorders: Secondary | ICD-10-CM | POA: Diagnosis not present

## 2015-02-10 DIAGNOSIS — R112 Nausea with vomiting, unspecified: Secondary | ICD-10-CM | POA: Diagnosis not present

## 2015-02-10 DIAGNOSIS — K565 Intestinal adhesions [bands] with obstruction (postprocedural) (postinfection): Principal | ICD-10-CM | POA: Diagnosis present

## 2015-02-10 DIAGNOSIS — K56609 Unspecified intestinal obstruction, unspecified as to partial versus complete obstruction: Secondary | ICD-10-CM | POA: Diagnosis present

## 2015-02-10 DIAGNOSIS — Z0189 Encounter for other specified special examinations: Secondary | ICD-10-CM

## 2015-02-10 DIAGNOSIS — K56600 Partial intestinal obstruction, unspecified as to cause: Secondary | ICD-10-CM | POA: Diagnosis present

## 2015-02-10 DIAGNOSIS — R109 Unspecified abdominal pain: Secondary | ICD-10-CM

## 2015-02-10 DIAGNOSIS — Z90710 Acquired absence of both cervix and uterus: Secondary | ICD-10-CM

## 2015-02-10 DIAGNOSIS — Z4682 Encounter for fitting and adjustment of non-vascular catheter: Secondary | ICD-10-CM | POA: Diagnosis not present

## 2015-02-10 LAB — URINALYSIS, ROUTINE W REFLEX MICROSCOPIC
Glucose, UA: NEGATIVE mg/dL
Ketones, ur: 40 mg/dL — AB
Nitrite: NEGATIVE
Protein, ur: NEGATIVE mg/dL
Specific Gravity, Urine: 1.039 — ABNORMAL HIGH (ref 1.005–1.030)
Urobilinogen, UA: 1 mg/dL (ref 0.0–1.0)
pH: 5.5 (ref 5.0–8.0)

## 2015-02-10 LAB — CBC WITH DIFFERENTIAL/PLATELET
Basophils Absolute: 0 10*3/uL (ref 0.0–0.1)
Basophils Relative: 0 % (ref 0–1)
Eosinophils Absolute: 0 10*3/uL (ref 0.0–0.7)
Eosinophils Relative: 0 % (ref 0–5)
HCT: 42.9 % (ref 36.0–46.0)
Hemoglobin: 13.9 g/dL (ref 12.0–15.0)
Lymphocytes Relative: 14 % (ref 12–46)
Lymphs Abs: 1.3 10*3/uL (ref 0.7–4.0)
MCH: 29.4 pg (ref 26.0–34.0)
MCHC: 32.4 g/dL (ref 30.0–36.0)
MCV: 90.9 fL (ref 78.0–100.0)
Monocytes Absolute: 0.9 10*3/uL (ref 0.1–1.0)
Monocytes Relative: 10 % (ref 3–12)
Neutro Abs: 7.3 10*3/uL (ref 1.7–7.7)
Neutrophils Relative %: 76 % (ref 43–77)
Platelets: 448 10*3/uL — ABNORMAL HIGH (ref 150–400)
RBC: 4.72 MIL/uL (ref 3.87–5.11)
RDW: 13.7 % (ref 11.5–15.5)
WBC: 9.5 10*3/uL (ref 4.0–10.5)

## 2015-02-10 LAB — I-STAT CHEM 8, ED
BUN: 11 mg/dL (ref 6–23)
Calcium, Ion: 1.17 mmol/L (ref 1.13–1.30)
Chloride: 98 mmol/L (ref 96–112)
Creatinine, Ser: 0.9 mg/dL (ref 0.50–1.10)
Glucose, Bld: 122 mg/dL — ABNORMAL HIGH (ref 70–99)
HCT: 45 % (ref 36.0–46.0)
Hemoglobin: 15.3 g/dL — ABNORMAL HIGH (ref 12.0–15.0)
Potassium: 3.5 mmol/L (ref 3.5–5.1)
Sodium: 140 mmol/L (ref 135–145)
TCO2: 27 mmol/L (ref 0–100)

## 2015-02-10 LAB — URINE MICROSCOPIC-ADD ON

## 2015-02-10 MED ORDER — ACETAMINOPHEN 650 MG RE SUPP: 650.0000 mg | Freq: Four times a day (QID) | RECTAL | Status: DC | PRN

## 2015-02-10 MED ORDER — LIP MEDEX EX OINT
1.0000 "application " | TOPICAL_OINTMENT | Freq: Two times a day (BID) | CUTANEOUS | Status: DC
  Administered 2015-02-10 – 2015-02-16 (×10): 1 via TOPICAL
  Filled 2015-02-10 (×5): qty 7

## 2015-02-10 MED ORDER — ALUM & MAG HYDROXIDE-SIMETH 200-200-20 MG/5ML PO SUSP: 30.0000 mL | Freq: Four times a day (QID) | ORAL | Status: DC | PRN

## 2015-02-10 MED ORDER — LACTATED RINGERS IV BOLUS (SEPSIS): 1000.0000 mL | Freq: Three times a day (TID) | INTRAVENOUS | Status: AC | PRN

## 2015-02-10 MED ORDER — LACTATED RINGERS IV BOLUS (SEPSIS)
1000.0000 mL | Freq: Once | INTRAVENOUS | Status: AC
  Administered 2015-02-10: 1000 mL via INTRAVENOUS

## 2015-02-10 MED ORDER — MORPHINE SULFATE 4 MG/ML IJ SOLN
4.0000 mg | Freq: Once | INTRAMUSCULAR | Status: AC
  Administered 2015-02-10: 4 mg via INTRAVENOUS
  Filled 2015-02-10: qty 1

## 2015-02-10 MED ORDER — ONDANSETRON HCL 4 MG/2ML IJ SOLN
4.0000 mg | Freq: Four times a day (QID) | INTRAMUSCULAR | Status: DC | PRN
  Administered 2015-02-10 – 2015-02-13 (×3): 4 mg via INTRAVENOUS
  Filled 2015-02-10 (×3): qty 2

## 2015-02-10 MED ORDER — SODIUM CHLORIDE 0.9 % IV SOLN
INTRAVENOUS | Status: DC
Start: 2015-02-10 — End: 2015-02-10

## 2015-02-10 MED ORDER — IOHEXOL 300 MG/ML  SOLN
100.0000 mL | Freq: Once | INTRAMUSCULAR | Status: AC | PRN
  Administered 2015-02-10: 100 mL via INTRAVENOUS

## 2015-02-10 MED ORDER — DIATRIZOATE MEGLUMINE & SODIUM 66-10 % PO SOLN
90.0000 mL | Freq: Once | ORAL | Status: AC
  Administered 2015-02-10: 90 mL via NASOGASTRIC

## 2015-02-10 MED ORDER — ONDANSETRON HCL 4 MG PO TABS: 4.0000 mg | ORAL_TABLET | Freq: Four times a day (QID) | ORAL | Status: DC | PRN

## 2015-02-10 MED ORDER — BISACODYL 10 MG RE SUPP
10.0000 mg | Freq: Every day | RECTAL | Status: DC
  Administered 2015-02-10 – 2015-02-16 (×6): 10 mg via RECTAL
  Filled 2015-02-10 (×6): qty 1

## 2015-02-10 MED ORDER — LUBIPROSTONE 24 MCG PO CAPS
24.0000 ug | ORAL_CAPSULE | Freq: Two times a day (BID) | ORAL | Status: DC
  Filled 2015-02-10 (×9): qty 1

## 2015-02-10 MED ORDER — SODIUM CHLORIDE 0.9 % IV SOLN
Freq: Once | INTRAVENOUS | Status: AC
  Administered 2015-02-10: 05:00:00 via INTRAVENOUS

## 2015-02-10 MED ORDER — IOHEXOL 300 MG/ML  SOLN: 50.0000 mL | Freq: Once | INTRAMUSCULAR | Status: DC | PRN

## 2015-02-10 MED ORDER — METOCLOPRAMIDE HCL 5 MG/ML IJ SOLN
5.0000 mg | Freq: Four times a day (QID) | INTRAMUSCULAR | Status: DC | PRN
  Administered 2015-02-11: 10 mg via INTRAVENOUS
  Filled 2015-02-10: qty 2

## 2015-02-10 MED ORDER — ACETAMINOPHEN 325 MG PO TABS: 650.0000 mg | ORAL_TABLET | Freq: Four times a day (QID) | ORAL | Status: DC | PRN

## 2015-02-10 MED ORDER — MENTHOL 3 MG MT LOZG
1.0000 | LOZENGE | OROMUCOSAL | Status: DC | PRN
  Filled 2015-02-10: qty 9

## 2015-02-10 MED ORDER — CETYLPYRIDINIUM CHLORIDE 0.05 % MT LIQD
7.0000 mL | Freq: Two times a day (BID) | OROMUCOSAL | Status: DC
  Administered 2015-02-10 – 2015-02-15 (×7): 7 mL via OROMUCOSAL

## 2015-02-10 MED ORDER — MORPHINE SULFATE 2 MG/ML IJ SOLN
2.0000 mg | INTRAMUSCULAR | Status: DC | PRN
  Administered 2015-02-10 – 2015-02-11 (×5): 2 mg via INTRAVENOUS
  Filled 2015-02-10 (×5): qty 1

## 2015-02-10 MED ORDER — PHENOL 1.4 % MT LIQD
2.0000 | OROMUCOSAL | Status: DC | PRN
  Filled 2015-02-10: qty 177

## 2015-02-10 MED ORDER — POTASSIUM CHLORIDE IN NACL 40-0.9 MEQ/L-% IV SOLN
INTRAVENOUS | Status: DC
  Administered 2015-02-10: 100 mL/h via INTRAVENOUS
  Filled 2015-02-10 (×4): qty 1000

## 2015-02-10 MED ORDER — ONDANSETRON HCL 4 MG/2ML IJ SOLN
4.0000 mg | Freq: Once | INTRAMUSCULAR | Status: AC
  Administered 2015-02-10: 4 mg via INTRAVENOUS
  Filled 2015-02-10: qty 2

## 2015-02-10 MED ORDER — PROMETHAZINE HCL 25 MG/ML IJ SOLN
6.2500 mg | INTRAMUSCULAR | Status: DC | PRN
  Administered 2015-02-11 (×2): 12.5 mg via INTRAVENOUS
  Filled 2015-02-10 (×2): qty 1

## 2015-02-10 MED ORDER — MAGIC MOUTHWASH
15.0000 mL | Freq: Four times a day (QID) | ORAL | Status: DC | PRN
  Filled 2015-02-10: qty 15

## 2015-02-10 NOTE — ED Notes (Signed)
Pt states she was seen Sunday at this facility and yesterday at her PCP for abdominal pain but is still having worsening pain. States she cannot keep anything down. Alert and oriented.

## 2015-02-10 NOTE — ED Provider Notes (Signed)
Pt relates she started having diffuse abdominal pain on March 20th with diarrhea. She was seen in the ED and had labs done and CT scan done showing some inflammation of her small bowel. She reports since she left the emergency department her pain is now localized to the upper abdomen bilaterally. She reports her abdomen is getting distended. She also is having nausea and vomiting but no more diarrhea. She is status post left ovary surgery and hysterectomy.  Patient is alert and cooperative. Her abdomen does appear to be distended. Her abdomen is firm in the upper abdomen without guarding or rebound.  Medical screening examination/treatment/procedure(s) were conducted as a shared visit with non-physician practitioner(s) and myself.  I personally evaluated the patient during the encounter.   EKG Interpretation None       Rolland Porter, MD, Barbette Or, MD 02/10/15 [phone]

## 2015-02-10 NOTE — H&P (Signed)
[facility] History and Physical  MARSHAL SCHRECENGOST TFT:[id number] DOB: [birth year] DOA: 02/10/2015  Referring physician: ER physician PCP: Irven Shelling, MD   Chief Complaint: abdominal pain, nausea and vomiting   HPI:  80 y.o. Female with past medical history of irritable bowel syndrome, constipation who presented to [facility] ED with worsening abdominal pain with associated nausea and nonbloody vomiting. Patient was seen in emergency room you days ago, she had CT scan done which did not reveal acute findings and she subsequently was discharged home. Her symptoms did not improve. She reports having associated diarrhea but the last episode was 3 days prior to this admission. Patient reports abdominal discomfort, cramp-like pain, diffuse, intermittent. She also reported worsening abdominal bloating for last few days. No alleviating symptoms. No specific aggravating factors. No complaints of fevers or chills. No other complaints such as chest pain, shortness of breath or palpitations. No urinary complaints.  In ED, patient was hemodynamically stable. Her blood work was unremarkable. CT abdomen on this admission was concerning for development of partial small bowel obstruction with the transition point in the right lower quadrant. Patient was admitted for further management of small bowel obstruction. Surgery has seen the patient in consultation.   Assessment & Plan    Principal Problem:   Partial small bowel obstruction - Possibly from adhesions. Patient has history of hysterectomy. - CT scan on the admission shows a partial small bowel obstruction. Transition point in right lower quadrant. - Keep nothing by mouth. Continue supportive care with IV fluids, analgesia and antiemetics as needed. - Appreciate surgery consult and recommendations.  Active Problems:   Nausea and vomiting, abdominal bloating  - Secondary to SBO as noted above - keep NPO - continue supportive care with IV fluids,  antiemetics as needed   DVT prophylaxis:  - SCD's bilaterally   Radiological Exams on Admission: Ct Abdomen Pelvis W Contrast 02/10/2015    1. Partial small bowel obstruction with transition point in the right lower quadrant. There is oral contrast in the right colon from CT 3 days prior. 2. Increased free fluid in the abdomen and pelvis. No free air or abscess.   Electronically Signed   By: Jeb Levering M.D.   On: 02/10/2015 06:29   Dg Abd Acute W/chest 02/10/2015  Dilated air-filled small bowel loops with air-fluid levels, the degree of small bowel distention has progressed from prior exam. This may reflect ileus or partial small bowel obstruction. There is no evidence of complete obstruction with oral contrast from prior CT now within the colon.   Electronically Signed   By: Jeb Levering M.D.   On: 02/10/2015 03:27     Code Status: Full Family Communication: Plan of care discussed with the patient  Disposition Plan: Admit for further evaluation, medical floor   Leisa Lenz, MD  [facility] Pager [phone]  Review of Systems:  Constitutional: Negative for fever, chills and malaise/fatigue. Negative for diaphoresis.  HENT: Negative for hearing loss, ear pain, nosebleeds, congestion, sore throat, neck pain, tinnitus and ear discharge.   Eyes: Negative for blurred vision, double vision, photophobia, pain, discharge and redness.  Respiratory: Negative for cough, hemoptysis, sputum production, shortness of breath, wheezing and stridor.   Cardiovascular: Negative for chest pain, palpitations, orthopnea, claudication and leg swelling.  Gastrointestinal: per HPI  Genitourinary: Negative for dysuria, urgency, frequency, hematuria and flank pain.  Musculoskeletal: Negative for myalgias, back pain, joint pain and falls.  Skin: Negative for itching and rash.  Neurological: Negative for dizziness  and weakness. Negative for tingling, tremors, sensory change, speech change, focal  weakness, loss of consciousness and headaches.  Endo/Heme/Allergies: Negative for environmental allergies and polydipsia. Does not bruise/bleed easily.  Psychiatric/Behavioral: Negative for suicidal ideas. The patient is not nervous/anxious.      Past Medical History  Diagnosis Date  . Irritable bowel syndrome    Past Surgical History  Procedure Laterality Date  . Hysterotomy     Social History:  reports that she has never smoked. She does not have any smokeless tobacco history on file. She reports that she does not drink alcohol or use illicit drugs.  Allergies  Allergen Reactions  . Sulfa Antibiotics Rash    Family History: Hypertension in family   Prior to Admission medications   Medication Sig Start Date End Date Taking? Authorizing Provider  Azelastine-Fluticasone 137-50 MCG/ACT SUSP Place 1 spray into the nose daily as needed (allergies).   Yes Historical Provider, MD  Cascara Sagrada 450 MG CAPS Take 450 mg by mouth 2 (two) times daily as needed (stool softner).   Yes Historical Provider, MD  dicyclomine (BENTYL) 20 MG tablet Take 1 tablet (20 mg total) by mouth 2 (two) times daily. 02/07/15  Yes Antonietta Breach, PA-C  guaiFENesin (MUCINEX) 600 MG 12 hr tablet Take 600 mg by mouth daily.   Yes Historical Provider, MD  lubiprostone (AMITIZA) 24 MCG capsule Take 24 mcg by mouth 2 (two) times daily with a meal.   Yes Historical Provider, MD  ondansetron (ZOFRAN) 4 MG tablet Take 1 tablet (4 mg total) by mouth every 6 (six) hours. 02/07/15  Yes Antonietta Breach, PA-C  Polyethyl Glycol-Propyl Glycol 0.4-0.3 % SOLN Place 1 drop into both eyes 3 (three) times daily as needed (dry eyes).   Yes Historical Provider, MD  Probiotic Product (ALIGN) 4 MG CAPS Take 4 mg by mouth daily.   Yes Historical Provider, MD   Physical Exam: Filed Vitals:   02/10/15 0221 02/10/15 0429 02/10/15 0656  BP: 126/69 149/77 130/75  Pulse: 77 79 63  Temp: 97.6 F (36.4 C)    TempSrc: Oral    Resp:  20 18  SpO2:  99% 97% 98%    Physical Exam  Constitutional: Appears well-developed and well-nourished. No distress.  HENT: Normocephalic. No tonsillar erythema or exudates Eyes: Conjunctivae and EOM are normal. PERRLA, no scleral icterus.  Neck: Normal ROM. Neck supple. No JVD. No tracheal deviation. No thyromegaly.  CVS: RRR, S1/S2 +, no murmurs, no gallops, no carotid bruit.  Pulmonary: Effort and breath sounds normal, no stridor, rhonchi, wheezes, rales.  Abdominal: softly distended, tender in mid abdomen, no guarding, BS (+).  Musculoskeletal: Normal range of motion. No edema and no tenderness.  Lymphadenopathy: No lymphadenopathy noted, cervical, inguinal. Neuro: Alert. Normal reflexes, muscle tone coordination. No focal neurologic deficits. Skin: Skin is warm and dry. No rash noted.  No erythema. No pallor.  Psychiatric: Normal mood and affect. Behavior, judgment, thought content normal.   Labs on Admission:  Basic Metabolic Panel:  Recent Labs Lab 02/07/15 1804 02/10/15 0517  NA 142 140  K 3.4* 3.5  CL 106 98  CO2 29  --   GLUCOSE 107* 122*  BUN 9 11  CREATININE 0.78 0.90  CALCIUM 9.6  --    Liver Function Tests:  Recent Labs Lab 02/07/15 1804  AST 18  ALT 12  ALKPHOS 63  BILITOT 0.5  PROT 6.9  ALBUMIN 4.3    Recent Labs Lab 02/07/15 1815  LIPASE 29  No results for input(s): AMMONIA in the last 168 hours. CBC:  Recent Labs Lab 02/07/15 1804 02/10/15 0510 02/10/15 0517  WBC 10.3 9.5  --   NEUTROABS 7.5 7.3  --   HGB 13.5 13.9 15.3*  HCT 41.8 42.9 45.0  MCV 90.9 90.9  --   PLT 445* 448*  --    Cardiac Enzymes: No results for input(s): CKTOTAL, CKMB, CKMBINDEX, TROPONINI in the last 168 hours. BNP: Invalid input(s): POCBNP CBG: No results for input(s): GLUCAP in the last 168 hours.  If 7PM-7AM, please contact night-coverage [email or web address] Password [facility] 02/10/2015, 7:11 AM

## 2015-02-10 NOTE — Consult Note (Signed)
Reason for Consult:SBO on CT Referring Physician: Quincy Carnes, PA-C  Danielle Oconnell is an 80 y.o. female.  HPI: Pt is a 80y/o female with a PMH of IBS, chronic constipation, and a R thyroid nodule who presents to the ED today with c/o abd pain x3d. She describes the pain as coming on post BM on Saturday approx 1500hrs. She describes the pain as diffuse and crampy. The pain is constant, worsening at times. She relays no aggravating or alleviating factors. The pain does not radiate from the abdomen. She was seen at ED on Sunday with same complaint. She was given Zofran, Bentyl, and pain mgt in the ED and an Abd CT was obtained. The CT was not concerning for SBO and she was D/C with Bentyl/Zofran Rx.  Risk factors include; oophorectomy and hysterectomy.  She was also instructed to withhold her Amitiza. She endorses some relief after ED visit, but not a return to baseline. Since D/C she endorses a decrease in appetite with increased N/V and no BM. She denies any blood in emesis or stool. She returns today with worsening pain and distention in her abd. She typically has 4-5 BMs/wk. She has managed her chronic constipation with daily Amitiza and Miralax x3/wk to this point. Her last colonoscopy was 3 years ago.    Past Medical History  Diagnosis Date  . Irritable bowel syndrome     Past Surgical History  Procedure Laterality Date  . Hysterotomy    . Left oophorectomy      History reviewed. No pertinent family history.  Social History:  reports that she has never smoked. She does not have any smokeless tobacco history on file. She reports that she does not drink alcohol or use illicit drugs.  Allergies:  Allergies  Allergen Reactions  . Sulfa Antibiotics Rash    Medications:  Prior to Admission:  Prescriptions prior to admission  Medication Sig Dispense Refill Last Dose  . Azelastine-Fluticasone 137-50 MCG/ACT SUSP Place 1 spray into the nose daily as needed (allergies).   02/07/2015  .  Cascara [place] 450 MG CAPS Take 450 mg by mouth 2 (two) times daily as needed (stool softner).   02/06/2015  . dicyclomine (BENTYL) 20 MG tablet Take 1 tablet (20 mg total) by mouth 2 (two) times daily. 20 tablet 0 02/08/2015  . guaiFENesin (MUCINEX) 600 MG 12 hr tablet Take 600 mg by mouth daily.   02/07/2015  . lubiprostone (AMITIZA) 24 MCG capsule Take 24 mcg by mouth 2 (two) times daily with a meal.   02/07/2015  . ondansetron (ZOFRAN) 4 MG tablet Take 1 tablet (4 mg total) by mouth every 6 (six) hours. 12 tablet 0 02/08/2015  . Polyethyl Glycol-Propyl Glycol 0.4-0.3 % SOLN Place 1 drop into both eyes 3 (three) times daily as needed (dry eyes).   02/07/2015  . Probiotic Product (ALIGN) 4 MG CAPS Take 4 mg by mouth daily.   02/07/2015    Results for orders placed or performed during the hospital encounter of 02/10/15 (from the past 48 hour(s))  CBC with Differential     Status: Abnormal   Collection Time: 02/10/15  5:10 AM  Result Value Ref Range   WBC 9.5 4.0 - 10.5 K/uL   RBC 4.72 3.87 - 5.11 MIL/uL   Hemoglobin 13.9 12.0 - 15.0 g/dL   HCT 42.9 36.0 - 46.0 %   MCV 90.9 78.0 - 100.0 fL   MCH 29.4 26.0 - 34.0 pg   MCHC 32.4 30.0 - 36.0  g/dL   RDW 13.7 11.5 - 15.5 %   Platelets 448 (H) 150 - 400 K/uL   Neutrophils Relative % 76 43 - 77 %   Neutro Abs 7.3 1.7 - 7.7 K/uL   Lymphocytes Relative 14 12 - 46 %   Lymphs Abs 1.3 0.7 - 4.0 K/uL   Monocytes Relative 10 3 - 12 %   Monocytes Absolute 0.9 0.1 - 1.0 K/uL   Eosinophils Relative 0 0 - 5 %   Eosinophils Absolute 0.0 0.0 - 0.7 K/uL   Basophils Relative 0 0 - 1 %   Basophils Absolute 0.0 0.0 - 0.1 K/uL  Urinalysis, Routine w reflex microscopic     Status: Abnormal   Collection Time: 02/10/15  5:15 AM  Result Value Ref Range   Color, Urine AMBER (A) YELLOW    Comment: BIOCHEMICALS MAY BE AFFECTED BY COLOR   APPearance CLOUDY (A) CLEAR   Specific Gravity, Urine 1.039 (H) 1.005 - 1.030   pH 5.5 5.0 - 8.0   Glucose, UA NEGATIVE NEGATIVE  mg/dL   Hgb urine dipstick TRACE (A) NEGATIVE   Bilirubin Urine SMALL (A) NEGATIVE   Ketones, ur 40 (A) NEGATIVE mg/dL   Protein, ur NEGATIVE NEGATIVE mg/dL   Urobilinogen, UA 1.0 0.0 - 1.0 mg/dL   Nitrite NEGATIVE NEGATIVE   Leukocytes, UA SMALL (A) NEGATIVE  Urine microscopic-add on     Status: Abnormal   Collection Time: 02/10/15  5:15 AM  Result Value Ref Range   WBC, UA 3-6 <3 WBC/hpf   RBC / HPF 0-2 <3 RBC/hpf   Bacteria, UA FEW (A) RARE   Crystals URIC ACID CRYSTALS (A) NEGATIVE   Urine-Other MUCOUS PRESENT   I-stat chem 8, ed     Status: Abnormal   Collection Time: 02/10/15  5:17 AM  Result Value Ref Range   Sodium 140 135 - 145 mmol/L   Potassium 3.5 3.5 - 5.1 mmol/L   Chloride 98 96 - 112 mmol/L   BUN 11 6 - 23 mg/dL   Creatinine, Ser 0.90 0.50 - 1.10 mg/dL   Glucose, Bld 122 (H) 70 - 99 mg/dL   Calcium, Ion 1.17 1.13 - 1.30 mmol/L   TCO2 27 0 - 100 mmol/L   Hemoglobin 15.3 (H) 12.0 - 15.0 g/dL   HCT 45.0 36.0 - 46.0 %    Ct Abdomen Pelvis W Contrast  02/10/2015   CLINICAL DATA:  80 year old female with abdominal pain for several days, abdominal bloating.  EXAM: CT ABDOMEN AND PELVIS WITH CONTRAST  TECHNIQUE: Multidetector CT imaging of the abdomen and pelvis was performed using the standard protocol following bolus administration of intravenous contrast.  CONTRAST:  127mL OMNIPAQUE IOHEXOL 300 MG/ML  SOLN  COMPARISON:  CT 3 days prior 02/07/2015  FINDINGS: The included lung bases are clear.  The stomach is distended with ingested contents. There is progressive small bowel dilatation with air and fluid throughout the abdomen from prior exam. A transition point is noted in the right lower quadrant, image number 63/[id number]. There are 2 adjacent hyperdensities in the region of transition that measured 13 and 8 mm, these are felt to be extraluminal from bowel and may be within the right ovary or phleboliths. Distal small bowel loops are decompressed. There is oral contrast throughout  the colon from prior CT. There is increased mesenteric haziness. Development of small amount of ascites in the upper abdomen. No free air or pneumatosis.  Small amount of perihepatic ascites. The liver, gallbladder, adrenal glands  and pancreas are normal. Small splenic granuloma. Kidneys demonstrate symmetric enhancement and excretion. No hydronephrosis or perinephric stranding.  The abdominal aorta is normal in caliber. There is no retroperitoneal adenopathy.  Within the pelvis the uterus is surgically absent. Urinary bladder is near completely decompressed. Small volume of simple free fluid, increased in volume compared to prior. There is no pelvic adenopathy.  No acute or suspicious osseous abnormalities.  IMPRESSION: 1. Partial small bowel obstruction with transition point in the right lower quadrant. There is oral contrast in the right colon from CT 3 days prior. 2. Increased free fluid in the abdomen and pelvis. No free air or abscess.   Electronically Signed   By: Jeb Levering M.D.   On: 02/10/2015 06:29   Dg Abd Acute W/chest  02/10/2015   CLINICAL DATA:  Mid abdominal pain for 1 week.  EXAM: ACUTE ABDOMEN SERIES (ABDOMEN 2 VIEW & CHEST 1 VIEW)  COMPARISON:  CT 02/07/2015 3 days prior.  FINDINGS: The cardiomediastinal contours are normal. The lungs are clear. There is no free intra-abdominal air. There are prominent small bowel loops with air-fluid levels in the central abdomen. The oral contrast from prior CT is seen within the ascending and proximal transverse colon. An air-fluid level is noted in the stomach. No radiopaque calculi. No acute osseous abnormalities are seen.  IMPRESSION: Dilated air-filled small bowel loops with air-fluid levels, the degree of small bowel distention has progressed from prior exam. This may reflect ileus or partial small bowel obstruction. There is no evidence of complete obstruction with oral contrast from prior CT now within the colon.   Electronically Signed   By:  Jeb Levering M.D.   On: 02/10/2015 03:27    Review of Systems  Constitutional: Negative for fever, chills, weight loss and malaise/fatigue.  HENT: Negative for sore throat.        PND controlled with prescription nasal spray  Eyes: Negative for blurred vision, double vision and photophobia.  Respiratory: Negative for cough, hemoptysis, shortness of breath and wheezing.   Cardiovascular: Negative for chest pain, palpitations, orthopnea and claudication.  Gastrointestinal: Positive for heartburn, nausea, vomiting, abdominal pain and constipation. Negative for blood in stool.  Genitourinary: Negative for dysuria, urgency, frequency, hematuria and flank pain.  Musculoskeletal: Negative for myalgias, back pain, joint pain and neck pain.  Skin: Negative for itching and rash.  Neurological: Negative for dizziness, tingling, speech change, seizures and loss of consciousness.  Endo/Heme/Allergies: Does not bruise/bleed easily.   Blood pressure 150/75, pulse 60, temperature 98.2 F (36.8 C), temperature source Oral, resp. rate 20, height 5\' 3"  (1.6 m), weight 181 lb 12.8 oz (82.464 kg), SpO2 98 %. Physical Exam  Constitutional: She is oriented to person, place, and time. She appears well-developed. No distress.  HENT:  Head: Normocephalic and atraumatic.  Eyes: Conjunctivae are normal. Pupils are equal, round, and reactive to light.  Neck: Normal range of motion. No JVD present.  Palpable known R thyroid nodule  Cardiovascular: Normal rate, regular rhythm and normal heart sounds.   Respiratory: Effort normal and breath sounds normal. She has no wheezes. She has no rales.  GI: She exhibits distension. She exhibits no mass. There is tenderness. There is no rebound and no guarding.  Hypoactive BS  Musculoskeletal: Normal range of motion. She exhibits no edema.  Neurological: She is alert and oriented to person, place, and time.  Skin: Skin is warm and dry. No rash noted. No erythema.   Psychiatric: She has a normal  mood and affect.    Assessment/Plan: 1. Partial SBO   -bowel rest, NGT decompression -will start SBO protocol. D/W nursing as this is  time sensitive   -IV Hydration -monitor electrolytes   -Abd XR with oral contrast -no indications for urgent surgical intervention.  Hopefully with resolve without surgical intervention.   Gwenith Spitz, PA-S 02/10/2015, 8:55 AM    I reviewed H&P, agree with the assessment and plan.  Will initiate SBO protocol.    Windie Marasco, ANP-BC

## 2015-02-10 NOTE — Progress Notes (Signed)
UR complete 

## 2015-02-10 NOTE — ED Provider Notes (Cosign Needed)
Patient received in sign out from PA [place] at shift change.  Briefly, 80 y.o. F with abdominal pain and N/V/D.  She was seen in the ED 2 days ago for similar complaints and had CT scan with inflammation of bowel but no acute findings.  She returns today due to worsening symptoms as well as distention. Lab work thus far is reassuring.  Pain well controlled at this time.  Plan:  Repeat CT pending.  Follow results and dispo.  Results for orders placed or performed during the hospital encounter of 02/10/15  CBC with Differential  Result Value Ref Range   WBC 9.5 4.0 - 10.5 K/uL   RBC 4.72 3.87 - 5.11 MIL/uL   Hemoglobin 13.9 12.0 - 15.0 g/dL   HCT 42.9 36.0 - 46.0 %   MCV 90.9 78.0 - 100.0 fL   MCH 29.4 26.0 - 34.0 pg   MCHC 32.4 30.0 - 36.0 g/dL   RDW 13.7 11.5 - 15.5 %   Platelets 448 (H) 150 - 400 K/uL   Neutrophils Relative % 76 43 - 77 %   Neutro Abs 7.3 1.7 - 7.7 K/uL   Lymphocytes Relative 14 12 - 46 %   Lymphs Abs 1.3 0.7 - 4.0 K/uL   Monocytes Relative 10 3 - 12 %   Monocytes Absolute 0.9 0.1 - 1.0 K/uL   Eosinophils Relative 0 0 - 5 %   Eosinophils Absolute 0.0 0.0 - 0.7 K/uL   Basophils Relative 0 0 - 1 %   Basophils Absolute 0.0 0.0 - 0.1 K/uL  Urinalysis, Routine w reflex microscopic  Result Value Ref Range   Color, Urine AMBER (A) YELLOW   APPearance CLOUDY (A) CLEAR   Specific Gravity, Urine 1.039 (H) 1.005 - 1.030   pH 5.5 5.0 - 8.0   Glucose, UA NEGATIVE NEGATIVE mg/dL   Hgb urine dipstick TRACE (A) NEGATIVE   Bilirubin Urine SMALL (A) NEGATIVE   Ketones, ur 40 (A) NEGATIVE mg/dL   Protein, ur NEGATIVE NEGATIVE mg/dL   Urobilinogen, UA 1.0 0.0 - 1.0 mg/dL   Nitrite NEGATIVE NEGATIVE   Leukocytes, UA SMALL (A) NEGATIVE  Urine microscopic-add on  Result Value Ref Range   WBC, UA 3-6 <3 WBC/hpf   RBC / HPF 0-2 <3 RBC/hpf   Bacteria, UA FEW (A) RARE   Crystals URIC ACID CRYSTALS (A) NEGATIVE   Urine-Other MUCOUS PRESENT   I-stat chem 8, ed  Result Value Ref  Range   Sodium 140 135 - 145 mmol/L   Potassium 3.5 3.5 - 5.1 mmol/L   Chloride 98 96 - 112 mmol/L   BUN 11 6 - 23 mg/dL   Creatinine, Ser 0.90 0.50 - 1.10 mg/dL   Glucose, Bld 122 (H) 70 - 99 mg/dL   Calcium, Ion 1.17 1.13 - 1.30 mmol/L   TCO2 27 0 - 100 mmol/L   Hemoglobin 15.3 (H) 12.0 - 15.0 g/dL   HCT 45.0 36.0 - 46.0 %   Ct Abdomen Pelvis W Contrast  02/10/2015   CLINICAL DATA:  80 year old female with abdominal pain for several days, abdominal bloating.  EXAM: CT ABDOMEN AND PELVIS WITH CONTRAST  TECHNIQUE: Multidetector CT imaging of the abdomen and pelvis was performed using the standard protocol following bolus administration of intravenous contrast.  CONTRAST:  123mL OMNIPAQUE IOHEXOL 300 MG/ML  SOLN  COMPARISON:  CT 3 days prior 02/07/2015  FINDINGS: The included lung bases are clear.  The stomach is distended with ingested contents. There is progressive small  bowel dilatation with air and fluid throughout the abdomen from prior exam. A transition point is noted in the right lower quadrant, image number 63/85. There are 2 adjacent hyperdensities in the region of transition that measured 13 and 8 mm, these are felt to be extraluminal from bowel and may be within the right ovary or phleboliths. Distal small bowel loops are decompressed. There is oral contrast throughout the colon from prior CT. There is increased mesenteric haziness. Development of small amount of ascites in the upper abdomen. No free air or pneumatosis.  Small amount of perihepatic ascites. The liver, gallbladder, adrenal glands and pancreas are normal. Small splenic granuloma. Kidneys demonstrate symmetric enhancement and excretion. No hydronephrosis or perinephric stranding.  The abdominal aorta is normal in caliber. There is no retroperitoneal adenopathy.  Within the pelvis the uterus is surgically absent. Urinary bladder is near completely decompressed. Small volume of simple free fluid, increased in volume compared to  prior. There is no pelvic adenopathy.  No acute or suspicious osseous abnormalities.  IMPRESSION: 1. Partial small bowel obstruction with transition point in the right lower quadrant. There is oral contrast in the right colon from CT 3 days prior. 2. Increased free fluid in the abdomen and pelvis. No free air or abscess.   Electronically Signed   By: Jeb Levering M.D.   On: 02/10/2015 06:29   Ct Abdomen Pelvis W Contrast  02/07/2015   CLINICAL DATA:  Acute onset of generalized abdominal cramping, radiating to the back. Nausea. Initial encounter.  EXAM: CT ABDOMEN AND PELVIS WITH CONTRAST  TECHNIQUE: Multidetector CT imaging of the abdomen and pelvis was performed using the standard protocol following bolus administration of intravenous contrast.  CONTRAST:  132mL OMNIPAQUE IOHEXOL 300 MG/ML  SOLN  COMPARISON:  Lumbar spine radiographs performed 05/07/2012  FINDINGS: The visualized lung bases are clear.  The liver and spleen are unremarkable in appearance. The gallbladder is within normal limits. The pancreas and adrenal glands are unremarkable. Focal extension of fat along the body of the pancreas is thought to remain within normal limits.  The kidneys are unremarkable in appearance. There is no evidence of hydronephrosis. No renal or ureteral stones are seen. No perinephric stranding is appreciated.  A small amount of free fluid is seen within the pelvis. There is minimal haziness about visualized distal small bowel loops, which given the patient's history of irritable bowel syndrome, could reflect mild acute inflammation. Visualized small bowel loops are partially filled with fluid, without significant distention. The stomach is within normal limits. No acute vascular abnormalities are seen.  The appendix is not definitely seen; there is no evidence of appendicitis. The colon is unremarkable in appearance, aside from minimal diverticulosis along the ascending colon.  The bladder is mildly distended and  grossly unremarkable in appearance. The patient is status post hysterectomy. No suspicious adnexal masses are seen. Scattered calcification is noted within the right ovary. No inguinal lymphadenopathy is seen.  No acute osseous abnormalities are identified. Vacuum phenomenon is incidentally noted at L5-S1; seen in isolation, this is a normal finding. Sclerotic change is noted at the sacroiliac joints.  IMPRESSION: Minimal haziness about visualized distal small bowel loops, which given the patient's history of irritable bowel syndrome, could reflect mild acute inflammation. No evidence of obstruction or significant inflammation. Small amount of free fluid seen in the pelvis. Otherwise unremarkable CT of the abdomen and pelvis.   Electronically Signed   By: Garald Balding M.D.   On: 02/07/2015 20:36  Dg Abd Acute W/chest  02/10/2015   CLINICAL DATA:  Mid abdominal pain for 1 week.  EXAM: ACUTE ABDOMEN SERIES (ABDOMEN 2 VIEW & CHEST 1 VIEW)  COMPARISON:  CT 02/07/2015 3 days prior.  FINDINGS: The cardiomediastinal contours are normal. The lungs are clear. There is no free intra-abdominal air. There are prominent small bowel loops with air-fluid levels in the central abdomen. The oral contrast from prior CT is seen within the ascending and proximal transverse colon. An air-fluid level is noted in the stomach. No radiopaque calculi. No acute osseous abnormalities are seen.  IMPRESSION: Dilated air-filled small bowel loops with air-fluid levels, the degree of small bowel distention has progressed from prior exam. This may reflect ileus or partial small bowel obstruction. There is no evidence of complete obstruction with oral contrast from prior CT now within the colon.   Electronically Signed   By: Jeb Levering M.D.   On: 02/10/2015 03:27    CT scan today revealing partial small bowel obstruction.  Results were discussed with patient, she is amendable to hospital admission.  Case discussed with Dr. Tana Coast who will  admit to med surg.  Temp admission orders placed.  General surgery, Dr. Hassell Done made aware-- will see patient in consult.  Larene Pickett, PA-C 02/10/15 0730

## 2015-02-11 LAB — COMPREHENSIVE METABOLIC PANEL
ALT: 10 U/L (ref 0–35)
AST: 18 U/L (ref 0–37)
Albumin: 3.7 g/dL (ref 3.5–5.2)
Alkaline Phosphatase: 48 U/L (ref 39–117)
Anion gap: 8 (ref 5–15)
BUN: 12 mg/dL (ref 6–23)
CO2: 30 mmol/L (ref 19–32)
Calcium: 8.8 mg/dL (ref 8.4–10.5)
Chloride: 102 mmol/L (ref 96–112)
Creatinine, Ser: 0.8 mg/dL (ref 0.50–1.10)
GFR calc Af Amer: 80 mL/min — ABNORMAL LOW (ref >90)
GFR calc non Af Amer: 69 mL/min — ABNORMAL LOW (ref >90)
Glucose, Bld: 103 mg/dL — ABNORMAL HIGH (ref 70–99)
Potassium: 4.2 mmol/L (ref 3.5–5.1)
Sodium: 140 mmol/L (ref 135–145)
Total Bilirubin: 0.8 mg/dL (ref 0.3–1.2)
Total Protein: 6.1 g/dL (ref 6.0–8.3)

## 2015-02-11 LAB — CBC
HCT: 40.9 % (ref 36.0–46.0)
Hemoglobin: 13.2 g/dL (ref 12.0–15.0)
MCH: 29.7 pg (ref 26.0–34.0)
MCHC: 32.3 g/dL (ref 30.0–36.0)
MCV: 92.1 fL (ref 78.0–100.0)
Platelets: 425 10*3/uL — ABNORMAL HIGH (ref 150–400)
RBC: 4.44 MIL/uL (ref 3.87–5.11)
RDW: 13.8 % (ref 11.5–15.5)
WBC: 6.8 10*3/uL (ref 4.0–10.5)

## 2015-02-11 LAB — GLUCOSE, CAPILLARY: Glucose-Capillary: 103 mg/dL — ABNORMAL HIGH (ref 70–99)

## 2015-02-11 MED ORDER — SODIUM CHLORIDE 0.9 % IV SOLN
INTRAVENOUS | Status: DC
  Administered 2015-02-11 – 2015-02-14 (×4): via INTRAVENOUS

## 2015-02-11 MED ORDER — CEFAZOLIN SODIUM-DEXTROSE 2-3 GM-% IV SOLR
2.0000 g | INTRAVENOUS | Status: AC
  Administered 2015-02-12: 2 g via INTRAVENOUS

## 2015-02-11 NOTE — ED Provider Notes (Signed)
CSN: [id number]     Arrival date & time 02/10/15  0215 History   First MD Initiated Contact with Patient 02/10/15 [phone]     Chief Complaint  Patient presents with  . Abdominal Pain     (Consider location/radiation/quality/duration/timing/severity/associated sxs/prior Treatment) Patient is a 80 y.o. female presenting with abdominal pain. The history is provided by the patient.  Abdominal Pain Pain location:  Generalized Pain quality: bloating   Pain severity:  Moderate Onset quality:  Gradual Timing:  Constant Progression:  Unchanged Chronicity:  Chronic Relieved by:  Nothing Worsened by:  Eating Ineffective treatments:  None tried Associated symptoms: diarrhea, nausea and vomiting   Associated symptoms: no fever and no shortness of breath     Past Medical History  Diagnosis Date  . Irritable bowel syndrome    Past Surgical History  Procedure Laterality Date  . Hysterotomy    . Left oophorectomy     History reviewed. No pertinent family history. History  Substance Use Topics  . Smoking status: Never Smoker   . Smokeless tobacco: Never Used  . Alcohol Use: No   OB History    No data available     Review of Systems  Constitutional: Negative for fever.  Respiratory: Negative for shortness of breath.   Gastrointestinal: Positive for nausea, vomiting, abdominal pain, diarrhea and abdominal distention.  All other systems reviewed and are negative.     Allergies  Sulfa antibiotics  Home Medications   Prior to Admission medications   Medication Sig Start Date End Date Taking? Authorizing Provider  Azelastine-Fluticasone 137-50 MCG/ACT SUSP Place 1 spray into the nose daily as needed (allergies).   Yes Historical Provider, MD  Cascara [place] 450 MG CAPS Take 450 mg by mouth 2 (two) times daily as needed (stool softner).   Yes Historical Provider, MD  dicyclomine (BENTYL) 20 MG tablet Take 1 tablet (20 mg total) by mouth 2 (two) times daily. 02/07/15  Yes Antonietta Breach, PA-C  guaiFENesin (MUCINEX) 600 MG 12 hr tablet Take 600 mg by mouth daily.   Yes Historical Provider, MD  lubiprostone (AMITIZA) 24 MCG capsule Take 24 mcg by mouth 2 (two) times daily with a meal.   Yes Historical Provider, MD  ondansetron (ZOFRAN) 4 MG tablet Take 1 tablet (4 mg total) by mouth every 6 (six) hours. 02/07/15  Yes Antonietta Breach, PA-C  Polyethyl Glycol-Propyl Glycol 0.4-0.3 % SOLN Place 1 drop into both eyes 3 (three) times daily as needed (dry eyes).   Yes Historical Provider, MD  Probiotic Product (ALIGN) 4 MG CAPS Take 4 mg by mouth daily.   Yes Historical Provider, MD   BP 126/63 mmHg  Pulse 65  Temp(Src) 97.9 F (36.6 C) (Oral)  Resp 16  Ht 5\' 3"  (1.6 m)  Wt 181 lb 12.8 oz (82.464 kg)  BMI 32.21 kg/m2  SpO2 98% Physical Exam  Constitutional: She appears well-developed and well-nourished. She appears distressed.  HENT:  Head: Normocephalic.  Eyes: Pupils are equal, round, and reactive to light.  Neck: Normal range of motion.  Cardiovascular: Normal rate and regular rhythm.   Pulmonary/Chest: Effort normal and breath sounds normal.  Abdominal: She exhibits distension. Bowel sounds are increased. There is tenderness.  Nursing note and vitals reviewed.   ED Course  Procedures (including critical care time) Labs Review Labs Reviewed  CBC WITH DIFFERENTIAL/PLATELET - Abnormal; Notable for the following:    Platelets 448 (*)    All other components within normal limits  URINALYSIS, ROUTINE  W REFLEX MICROSCOPIC - Abnormal; Notable for the following:    Color, Urine AMBER (*)    APPearance CLOUDY (*)    Specific Gravity, Urine 1.039 (*)    Hgb urine dipstick TRACE (*)    Bilirubin Urine SMALL (*)    Ketones, ur 40 (*)    Leukocytes, UA SMALL (*)    All other components within normal limits  URINE MICROSCOPIC-ADD ON - Abnormal; Notable for the following:    Bacteria, UA FEW (*)    Crystals URIC ACID CRYSTALS (*)    All other components within normal  limits  COMPREHENSIVE METABOLIC PANEL - Abnormal; Notable for the following:    Glucose, Bld 103 (*)    GFR calc non Af Amer 69 (*)    GFR calc Af Amer 80 (*)    All other components within normal limits  CBC - Abnormal; Notable for the following:    Platelets 425 (*)    All other components within normal limits  GLUCOSE, CAPILLARY - Abnormal; Notable for the following:    Glucose-Capillary 103 (*)    All other components within normal limits  I-STAT CHEM 8, ED - Abnormal; Notable for the following:    Glucose, Bld 122 (*)    Hemoglobin 15.3 (*)    All other components within normal limits    Imaging Review Ct Abdomen Pelvis W Contrast  02/10/2015   CLINICAL DATA:  80 year old female with abdominal pain for several days, abdominal bloating.  EXAM: CT ABDOMEN AND PELVIS WITH CONTRAST  TECHNIQUE: Multidetector CT imaging of the abdomen and pelvis was performed using the standard protocol following bolus administration of intravenous contrast.  CONTRAST:  125mL OMNIPAQUE IOHEXOL 300 MG/ML  SOLN  COMPARISON:  CT 3 days prior 02/07/2015  FINDINGS: The included lung bases are clear.  The stomach is distended with ingested contents. There is progressive small bowel dilatation with air and fluid throughout the abdomen from prior exam. A transition point is noted in the right lower quadrant, image number 63/85. There are 2 adjacent hyperdensities in the region of transition that measured 13 and 8 mm, these are felt to be extraluminal from bowel and may be within the right ovary or phleboliths. Distal small bowel loops are decompressed. There is oral contrast throughout the colon from prior CT. There is increased mesenteric haziness. Development of small amount of ascites in the upper abdomen. No free air or pneumatosis.  Small amount of perihepatic ascites. The liver, gallbladder, adrenal glands and pancreas are normal. Small splenic granuloma. Kidneys demonstrate symmetric enhancement and excretion. No  hydronephrosis or perinephric stranding.  The abdominal aorta is normal in caliber. There is no retroperitoneal adenopathy.  Within the pelvis the uterus is surgically absent. Urinary bladder is near completely decompressed. Small volume of simple free fluid, increased in volume compared to prior. There is no pelvic adenopathy.  No acute or suspicious osseous abnormalities.  IMPRESSION: 1. Partial small bowel obstruction with transition point in the right lower quadrant. There is oral contrast in the right colon from CT 3 days prior. 2. Increased free fluid in the abdomen and pelvis. No free air or abscess.   Electronically Signed   By: Jeb Levering M.D.   On: 02/10/2015 06:29   Dg Abd Acute W/chest  02/10/2015   CLINICAL DATA:  Mid abdominal pain for 1 week.  EXAM: ACUTE ABDOMEN SERIES (ABDOMEN 2 VIEW & CHEST 1 VIEW)  COMPARISON:  CT 02/07/2015 3 days prior.  FINDINGS: The cardiomediastinal  contours are normal. The lungs are clear. There is no free intra-abdominal air. There are prominent small bowel loops with air-fluid levels in the central abdomen. The oral contrast from prior CT is seen within the ascending and proximal transverse colon. An air-fluid level is noted in the stomach. No radiopaque calculi. No acute osseous abnormalities are seen.  IMPRESSION: Dilated air-filled small bowel loops with air-fluid levels, the degree of small bowel distention has progressed from prior exam. This may reflect ileus or partial small bowel obstruction. There is no evidence of complete obstruction with oral contrast from prior CT now within the colon.   Electronically Signed   By: Jeb Levering M.D.   On: 02/10/2015 03:27   Dg Abd Portable 1v-small Bowel Obstruction Protocol-initial, 8 Hr Delay  02/11/2015   CLINICAL DATA:  Small bowel obstruction protocol. 8 hour delayed film, status post Gastrografin administration. Assess for passage of contrast. Subsequent encounter.  EXAM: PORTABLE ABDOMEN - 1 VIEW   COMPARISON:  Abdominal radiograph performed earlier today at 11:33 a.m., and CT of the abdomen and pelvis performed earlier today at 6:15 a.m.  FINDINGS: Contrast infused into the stomach remains at the fundus of the stomach. 90 mL of Gastrografin contrast was administered.  There is persistent diffuse distention of small bowel loops throughout the abdomen, compatible with previously noted small bowel obstruction. Contrast within the cecum and ascending colon reflects the study from March 20th; this contrast has not changed in position since the recent prior study, concerning for significant colonic dysmotility.  No acute osseous abnormalities are seen. Contrast is seen partially filling the bladder. The patient's enteric tube is noted ending overlying the body of the stomach.  IMPRESSION: 1. Contrast infused into the stomach remains at the fundus of the stomach, after 8 hours. Would follow-up with 24-hour film, as per small-bowel obstruction protocol. 2. Persistent diffuse distention of small bowel loops throughout the abdomen, compatible with previously noted small bowel obstruction. 3. Contrast within the cecum and ascending colon (from the CT performed 02/07/2015) has not changed in position since the prior CT from earlier this morning, concerning for significant underlying colonic dysmotility.   Electronically Signed   By: Garald Balding M.D.   On: 02/11/2015 02:18   Dg Abd Portable 1v-small Bowel Protocol-position Verification  02/10/2015   CLINICAL DATA:  NG tube placement  EXAM: PORTABLE ABDOMEN - 1 VIEW  COMPARISON:  CT 02/10/2015  FINDINGS: NG tube is in place with the tip in the proximal to mid stomach. Dilated small bowel loops in the abdomen and upper pelvis compatible with small bowel obstruction. Oral contrast material seen within the right colon compatible with partial small bowel obstruction.  IMPRESSION: NG tube tip in the proximal to mid stomach.  Partial small bowel obstruction pattern.    Electronically Signed   By: Rolm Baptise M.D.   On: 02/10/2015 12:11     EKG Interpretation None    due to fiindings on acute abdomen series will obtain repeat CT Scan   MDM   Final diagnoses:  Abdominal pain  Abdominal bloating  Partial small bowel obstruction         Junius Creamer, NP 02/11/15 0846  Junius Creamer, NP 02/11/15 [place], NP 02/11/15 7416  Junius Creamer, NP 02/11/15 [place], MD 02/12/15 2302

## 2015-02-11 NOTE — Plan of Care (Signed)
Problem: Phase I Progression Outcomes Goal: OOB as tolerated unless otherwise ordered Outcome: Completed/Met Date Met:  02/11/15 Patient out of bed to chair this afternoon.

## 2015-02-11 NOTE — Progress Notes (Signed)
Repeated  Soap suds enema per MD's order if no results. Patient did not have results from first enema.The second enema given with clear watery return,patient did tolerate procedure. Will continue to monitor patient.

## 2015-02-11 NOTE — Progress Notes (Signed)
PROGRESS NOTE  Danielle Oconnell KNL:[id number] DOB: 1935-04-29 DOA: 02/10/2015 PCP: Irven Shelling, MD  HPI: 80 y.o. Female with past medical history of irritable bowel syndrome, constipation who presented to [facility] ED with worsening abdominal pain with associated nausea and nonbloody vomiting. Patient was seen in emergency room you days ago, she had CT scan done which did not reveal acute findings and she subsequently was discharged home. Her symptoms did not improve. She reports having associated diarrhea but the last episode was 3 days prior to this admission. Patient reports abdominal discomfort, cramp-like pain, diffuse, intermittent. She also reported worsening abdominal bloating for last few days. No alleviating symptoms. No specific aggravating factors. No complaints of fevers or chills. No other complaints such as chest pain, shortness of breath or palpitations. No urinary complaints. In ED, patient was hemodynamically stable. Her blood work was unremarkable. CT abdomen on this admission was concerning for development of partial small bowel obstruction with the transition point in the right lower quadrant. Patient was admitted for further management of small bowel obstruction. Surgery has seen the patient in consultation.  Subjective / 24 H Interval events Ongoing abdominal distention, feels about the same  Assessment/Plan: Principal Problem:   Partial small bowel obstruction Active Problems:   Nausea and vomiting   Partial small bowel obstruction - Possibly from adhesions. Patient has history of hysterectomy. - CT scan on the admission shows a partial small bowel obstruction. Transition point in right lower quadrant. - Keep nothing by mouth. Continue supportive care with IV fluids, analgesia and antiemetics as needed. - Appreciate surgery consult and recommendations.   Nausea and vomiting, abdominal bloating  - Secondary to SBO as noted above - keep NPO - continue supportive  care with IV fluids, antiemetics as needed   Diet: Diet NPO time specified Fluids: NS with KCl 100 cc/h DVT Prophylaxis: SCD  Code Status: Full Code Family Communication: d/w son and daughter in law bedside  Disposition Plan: inpatient  Consultants:  General surgery   Procedures:  None    Antibiotics  Anti-infectives    None       Studies  Ct Abdomen Pelvis W Contrast  02/10/2015   CLINICAL DATA:  80 year old female with abdominal pain for several days, abdominal bloating.  EXAM: CT ABDOMEN AND PELVIS WITH CONTRAST  TECHNIQUE: Multidetector CT imaging of the abdomen and pelvis was performed using the standard protocol following bolus administration of intravenous contrast.  CONTRAST:  11mL OMNIPAQUE IOHEXOL 300 MG/ML  SOLN  COMPARISON:  CT 3 days prior 02/07/2015  FINDINGS: The included lung bases are clear.  The stomach is distended with ingested contents. There is progressive small bowel dilatation with air and fluid throughout the abdomen from prior exam. A transition point is noted in the right lower quadrant, image number 63/85. There are 2 adjacent hyperdensities in the region of transition that measured 13 and 8 mm, these are felt to be extraluminal from bowel and may be within the right ovary or phleboliths. Distal small bowel loops are decompressed. There is oral contrast throughout the colon from prior CT. There is increased mesenteric haziness. Development of small amount of ascites in the upper abdomen. No free air or pneumatosis.  Small amount of perihepatic ascites. The liver, gallbladder, adrenal glands and pancreas are normal. Small splenic granuloma. Kidneys demonstrate symmetric enhancement and excretion. No hydronephrosis or perinephric stranding.  The abdominal aorta is normal in caliber. There is no retroperitoneal adenopathy.  Within the pelvis the uterus is surgically  absent. Urinary bladder is near completely decompressed. Small volume of simple free fluid,  increased in volume compared to prior. There is no pelvic adenopathy.  No acute or suspicious osseous abnormalities.  IMPRESSION: 1. Partial small bowel obstruction with transition point in the right lower quadrant. There is oral contrast in the right colon from CT 3 days prior. 2. Increased free fluid in the abdomen and pelvis. No free air or abscess.   Electronically Signed   By: Jeb Levering M.D.   On: 02/10/2015 06:29   Dg Abd Acute W/chest  02/10/2015   CLINICAL DATA:  Mid abdominal pain for 1 week.  EXAM: ACUTE ABDOMEN SERIES (ABDOMEN 2 VIEW & CHEST 1 VIEW)  COMPARISON:  CT 02/07/2015 3 days prior.  FINDINGS: The cardiomediastinal contours are normal. The lungs are clear. There is no free intra-abdominal air. There are prominent small bowel loops with air-fluid levels in the central abdomen. The oral contrast from prior CT is seen within the ascending and proximal transverse colon. An air-fluid level is noted in the stomach. No radiopaque calculi. No acute osseous abnormalities are seen.  IMPRESSION: Dilated air-filled small bowel loops with air-fluid levels, the degree of small bowel distention has progressed from prior exam. This may reflect ileus or partial small bowel obstruction. There is no evidence of complete obstruction with oral contrast from prior CT now within the colon.   Electronically Signed   By: Jeb Levering M.D.   On: 02/10/2015 03:27   Dg Abd Portable 1v-small Bowel Obstruction Protocol-initial, 8 Hr Delay  02/11/2015   CLINICAL DATA:  Small bowel obstruction protocol. 8 hour delayed film, status post Gastrografin administration. Assess for passage of contrast. Subsequent encounter.  EXAM: PORTABLE ABDOMEN - 1 VIEW  COMPARISON:  Abdominal radiograph performed earlier today at 11:33 a.m., and CT of the abdomen and pelvis performed earlier today at 6:15 a.m.  FINDINGS: Contrast infused into the stomach remains at the fundus of the stomach. 90 mL of Gastrografin contrast was  administered.  There is persistent diffuse distention of small bowel loops throughout the abdomen, compatible with previously noted small bowel obstruction. Contrast within the cecum and ascending colon reflects the study from March 20th; this contrast has not changed in position since the recent prior study, concerning for significant colonic dysmotility.  No acute osseous abnormalities are seen. Contrast is seen partially filling the bladder. The patient's enteric tube is noted ending overlying the body of the stomach.  IMPRESSION: 1. Contrast infused into the stomach remains at the fundus of the stomach, after 8 hours. Would follow-up with 24-hour film, as per small-bowel obstruction protocol. 2. Persistent diffuse distention of small bowel loops throughout the abdomen, compatible with previously noted small bowel obstruction. 3. Contrast within the cecum and ascending colon (from the CT performed 02/07/2015) has not changed in position since the prior CT from earlier this morning, concerning for significant underlying colonic dysmotility.   Electronically Signed   By: Garald Balding M.D.   On: 02/11/2015 02:18   Dg Abd Portable 1v-small Bowel Protocol-position Verification  02/10/2015   CLINICAL DATA:  NG tube placement  EXAM: PORTABLE ABDOMEN - 1 VIEW  COMPARISON:  CT 02/10/2015  FINDINGS: NG tube is in place with the tip in the proximal to mid stomach. Dilated small bowel loops in the abdomen and upper pelvis compatible with small bowel obstruction. Oral contrast material seen within the right colon compatible with partial small bowel obstruction.  IMPRESSION: NG tube tip in the proximal  to mid stomach.  Partial small bowel obstruction pattern.   Electronically Signed   By: Rolm Baptise M.D.   On: 02/10/2015 12:11    Objective  Filed Vitals:   02/10/15 1353 02/10/15 2154 02/11/15 0525 02/11/15 1007  BP: 149/67 131/68 126/63   Pulse: 62 68 65   Temp: 98.5 F (36.9 C) 97.9 F (36.6 C) 97.9 F (36.6  C)   TempSrc: Oral Oral Oral   Resp: 19 18 16    Height:      Weight:    81.738 kg (180 lb 3.2 oz)  SpO2: 98% 97% 98%     Intake/Output Summary (Last 24 hours) at 02/11/15 1057 Last data filed at 02/11/15 1000  Gross per 24 hour  Intake   1160 ml  Output    552 ml  Net    608 ml   Filed Weights   02/10/15 0824 02/11/15 1007  Weight: 82.464 kg (181 lb 12.8 oz) 81.738 kg (180 lb 3.2 oz)    Exam:  General:  NAD  HEENT: no scleral icterus, PERRL  Cardiovascular: RRR without MRG, 2+ peripheral pulses, no edema  Respiratory: CTA biL, good air movement, no wheezing, no crackles, no rales  Abdomen: soft, mild tenderness throughout, BS -, no guarding  MSK/Extremities: no clubbing/cyanosis, no joint swelling  Skin: no rashes  Neuro: non focal  Data Reviewed: Basic Metabolic Panel:  Recent Labs Lab 02/07/15 1804 02/10/15 0517 02/11/15 0525  NA 142 140 140  K 3.4* 3.5 4.2  CL 106 98 102  CO2 29  --  30  GLUCOSE 107* 122* 103*  BUN 9 11 12   CREATININE 0.78 0.90 0.80  CALCIUM 9.6  --  8.8   Liver Function Tests:  Recent Labs Lab 02/07/15 1804 02/11/15 0525  AST 18 18  ALT 12 10  ALKPHOS 63 48  BILITOT 0.5 0.8  PROT 6.9 6.1  ALBUMIN 4.3 3.7    Recent Labs Lab 02/07/15 1815  LIPASE 29   CBC:  Recent Labs Lab 02/07/15 1804 02/10/15 0510 02/10/15 0517 02/11/15 0525  WBC 10.3 9.5  --  6.8  NEUTROABS 7.5 7.3  --   --   HGB 13.5 13.9 15.3* 13.2  HCT 41.8 42.9 45.0 40.9  MCV 90.9 90.9  --  92.1  PLT 445* 448*  --  425*   CBG:  Recent Labs Lab 02/11/15 0730  GLUCAP 103*   Scheduled Meds: . antiseptic oral rinse  7 mL Mouth Rinse BID  . bisacodyl  10 mg Rectal Daily  . lip balm  1 application Topical BID  . lubiprostone  24 mcg Oral BID WC   Continuous Infusions: . 0.9 % NaCl with KCl 40 mEq / L 100 mL/hr (02/10/15 1227)    Marzetta Board, MD [facility] Pager [phone]. If 7 PM - 7 AM, please contact night-coverage at  [email or web address], password [facility] 02/11/2015, 10:57 AM  LOS: 1 day

## 2015-02-11 NOTE — Progress Notes (Signed)
Soap suds enema given this am,per MD order,patient tolerated procedure well. Will continue to monitor patient.

## 2015-02-11 NOTE — Progress Notes (Signed)
Patient ID: Danielle Oconnell, female   DOB: [birth year], 80 y.o.   MRN: [id number]     [address] [place], [place] [address]    Phone: [phone] FAX: [phone]     Subjective: 62m recorded from NGT, but cannister is full of bilious and fecal output.  AXR reviewed.  VSS.  Afebrile.  No flatus.   Objective:  Vital signs:  Filed Vitals:   02/10/15 1353 02/10/15 2154 02/11/15 0525 02/11/15 1007  BP: 149/67 131/68 126/63   Pulse: 62 68 65   Temp: 98.5 F (36.9 C) 97.9 F (36.6 C) 97.9 F (36.6 C)   TempSrc: Oral Oral Oral   Resp: _0 Height:      Weight:    81.738 kg (180 lb 3.2 oz)  SpO2: 98% 97% 98%     Last BM Date: 02/07/15  Intake/Output   Yesterday:  03/23 0701 - 03/24 0700 In: 1040 [NG/GT:800] Out: 2 [Urine:2] This shift:  Total I/O In: 120 [Other:120] Out: 550 [Urine:550]   Physical Exam: General: Pt awake/alert/oriented x4 in no acute distress  Abdomen: Soft.  distended.  Non tender. No evidence of peritonitis.  No incarcerated hernias.   Problem List:   Principal Problem:   Partial small bowel obstruction Active Problems:   Nausea and vomiting    Results:   Labs: Results for orders placed or performed during the hospital encounter of 02/10/15 (from the past 48 hour(s))  CBC with Differential     Status: Abnormal   Collection Time: 02/10/15  5:10 AM  Result Value Ref Range   WBC 9.5 4.0 - 10.5 K/uL   RBC 4.72 3.87 - 5.11 MIL/uL   Hemoglobin 13.9 12.0 - 15.0 g/dL   HCT 42.9 36.0 - 46.0 %   MCV 90.9 78.0 - 100.0 fL   MCH 29.4 26.0 - 34.0 pg   MCHC 32.4 30.0 - 36.0 g/dL   RDW 13.7 11.5 - 15.5 %   Platelets 448 (H) 150 - 400 K/uL   Neutrophils Relative % 76 43 - 77 %   Neutro Abs 7.3 1.7 - 7.7 K/uL   Lymphocytes Relative 14 12 - 46 %   Lymphs Abs 1.3 0.7 - 4.0 K/uL   Monocytes Relative 10 3 - 12 %   Monocytes Absolute 0.9 0.1 - 1.0 K/uL   Eosinophils Relative 0  0 - 5 %   Eosinophils Absolute 0.0 0.0 - 0.7 K/uL   Basophils Relative 0 0 - 1 %   Basophils Absolute 0.0 0.0 - 0.1 K/uL  Urinalysis, Routine w reflex microscopic     Status: Abnormal   Collection Time: 02/10/15  5:15 AM  Result Value Ref Range   Color, Urine AMBER (A) YELLOW    Comment: BIOCHEMICALS MAY BE AFFECTED BY COLOR   APPearance CLOUDY (A) CLEAR   Specific Gravity, Urine 1.039 (H) 1.005 - 1.030   pH 5.5 5.0 - 8.0   Glucose, UA NEGATIVE NEGATIVE mg/dL   Hgb urine dipstick TRACE (A) NEGATIVE   Bilirubin Urine SMALL (A) NEGATIVE   Ketones, ur 40 (A) NEGATIVE mg/dL   Protein, ur NEGATIVE NEGATIVE mg/dL   Urobilinogen, UA 1.0 0.0 - 1.0 mg/dL   Nitrite NEGATIVE NEGATIVE   Leukocytes, UA SMALL (A) NEGATIVE  Urine microscopic-add on     Status: Abnormal   Collection Time: 02/10/15  5:15 AM  Result Value Ref Range  WBC, UA 3-6 <3 WBC/hpf   RBC / HPF 0-2 <3 RBC/hpf   Bacteria, UA FEW (A) RARE   Crystals URIC ACID CRYSTALS (A) NEGATIVE   Urine-Other MUCOUS PRESENT   I-stat chem 8, ed     Status: Abnormal   Collection Time: 02/10/15  5:17 AM  Result Value Ref Range   Sodium 140 135 - 145 mmol/L   Potassium 3.5 3.5 - 5.1 mmol/L   Chloride 98 96 - 112 mmol/L   BUN 11 6 - 23 mg/dL   Creatinine, Ser 0.90 0.50 - 1.10 mg/dL   Glucose, Bld 122 (H) 70 - 99 mg/dL   Calcium, Ion 1.17 1.13 - 1.30 mmol/L   TCO2 27 0 - 100 mmol/L   Hemoglobin 15.3 (H) 12.0 - 15.0 g/dL   HCT 45.0 36.0 - 46.0 %  Comprehensive metabolic panel     Status: Abnormal   Collection Time: 02/11/15  5:25 AM  Result Value Ref Range   Sodium 140 135 - 145 mmol/L   Potassium 4.2 3.5 - 5.1 mmol/L   Chloride 102 96 - 112 mmol/L   CO2 30 19 - 32 mmol/L   Glucose, Bld 103 (H) 70 - 99 mg/dL   BUN 12 6 - 23 mg/dL   Creatinine, Ser 0.80 0.50 - 1.10 mg/dL   Calcium 8.8 8.4 - 10.5 mg/dL   Total Protein 6.1 6.0 - 8.3 g/dL   Albumin 3.7 3.5 - 5.2 g/dL   AST 18 0 - 37 U/L   ALT 10 0 - 35 U/L   Alkaline Phosphatase 48  39 - 117 U/L   Total Bilirubin 0.8 0.3 - 1.2 mg/dL   GFR calc non Af Amer 69 (L) >90 mL/min   GFR calc Af Amer 80 (L) >90 mL/min    Comment: (NOTE) The eGFR has been calculated using the CKD EPI equation. This calculation has not been validated in all clinical situations. eGFR's persistently <90 mL/min signify possible Chronic Kidney Disease.    Anion gap 8 5 - 15  CBC     Status: Abnormal   Collection Time: 02/11/15  5:25 AM  Result Value Ref Range   WBC 6.8 4.0 - 10.5 K/uL   RBC 4.44 3.87 - 5.11 MIL/uL   Hemoglobin 13.2 12.0 - 15.0 g/dL   HCT 40.9 36.0 - 46.0 %   MCV 92.1 78.0 - 100.0 fL   MCH 29.7 26.0 - 34.0 pg   MCHC 32.3 30.0 - 36.0 g/dL   RDW 13.8 11.5 - 15.5 %   Platelets 425 (H) 150 - 400 K/uL  Glucose, capillary     Status: Abnormal   Collection Time: 02/11/15  7:30 AM  Result Value Ref Range   Glucose-Capillary 103 (H) 70 - 99 mg/dL    Imaging / Studies: Ct Abdomen Pelvis W Contrast  02/10/2015   CLINICAL DATA:  80 year old female with abdominal pain for several days, abdominal bloating.  EXAM: CT ABDOMEN AND PELVIS WITH CONTRAST  TECHNIQUE: Multidetector CT imaging of the abdomen and pelvis was performed using the standard protocol following bolus administration of intravenous contrast.  CONTRAST:  175m OMNIPAQUE IOHEXOL 300 MG/ML  SOLN  COMPARISON:  CT 3 days prior 02/07/2015  FINDINGS: The included lung bases are clear.  The stomach is distended with ingested contents. There is progressive small bowel dilatation with air and fluid throughout the abdomen from prior exam. A transition point is noted in the right lower quadrant, image number 63/[id number]. There are 2 adjacent hyperdensities  in the region of transition that measured 13 and 8 mm, these are felt to be extraluminal from bowel and may be within the right ovary or phleboliths. Distal small bowel loops are decompressed. There is oral contrast throughout the colon from prior CT. There is increased mesenteric haziness.  Development of small amount of ascites in the upper abdomen. No free air or pneumatosis.  Small amount of perihepatic ascites. The liver, gallbladder, adrenal glands and pancreas are normal. Small splenic granuloma. Kidneys demonstrate symmetric enhancement and excretion. No hydronephrosis or perinephric stranding.  The abdominal aorta is normal in caliber. There is no retroperitoneal adenopathy.  Within the pelvis the uterus is surgically absent. Urinary bladder is near completely decompressed. Small volume of simple free fluid, increased in volume compared to prior. There is no pelvic adenopathy.  No acute or suspicious osseous abnormalities.  IMPRESSION: 1. Partial small bowel obstruction with transition point in the right lower quadrant. There is oral contrast in the right colon from CT 3 days prior. 2. Increased free fluid in the abdomen and pelvis. No free air or abscess.   Electronically Signed   By: Jeb Levering M.D.   On: 02/10/2015 06:29   Dg Abd Acute W/chest  02/10/2015   CLINICAL DATA:  Mid abdominal pain for 1 week.  EXAM: ACUTE ABDOMEN SERIES (ABDOMEN 2 VIEW & CHEST 1 VIEW)  COMPARISON:  CT 02/07/2015 3 days prior.  FINDINGS: The cardiomediastinal contours are normal. The lungs are clear. There is no free intra-abdominal air. There are prominent small bowel loops with air-fluid levels in the central abdomen. The oral contrast from prior CT is seen within the ascending and proximal transverse colon. An air-fluid level is noted in the stomach. No radiopaque calculi. No acute osseous abnormalities are seen.  IMPRESSION: Dilated air-filled small bowel loops with air-fluid levels, the degree of small bowel distention has progressed from prior exam. This may reflect ileus or partial small bowel obstruction. There is no evidence of complete obstruction with oral contrast from prior CT now within the colon.   Electronically Signed   By: Jeb Levering M.D.   On: 02/10/2015 03:27   Dg Abd Portable  1v-small Bowel Obstruction Protocol-initial, 8 Hr Delay  02/11/2015   CLINICAL DATA:  Small bowel obstruction protocol. 8 hour delayed film, status post Gastrografin administration. Assess for passage of contrast. Subsequent encounter.  EXAM: PORTABLE ABDOMEN - 1 VIEW  COMPARISON:  Abdominal radiograph performed earlier today at 11:33 a.m., and CT of the abdomen and pelvis performed earlier today at 6:15 a.m.  FINDINGS: Contrast infused into the stomach remains at the fundus of the stomach. 90 mL of Gastrografin contrast was administered.  There is persistent diffuse distention of small bowel loops throughout the abdomen, compatible with previously noted small bowel obstruction. Contrast within the cecum and ascending colon reflects the study from March 20th; this contrast has not changed in position since the recent prior study, concerning for significant colonic dysmotility.  No acute osseous abnormalities are seen. Contrast is seen partially filling the bladder. The patient's enteric tube is noted ending overlying the body of the stomach.  IMPRESSION: 1. Contrast infused into the stomach remains at the fundus of the stomach, after 8 hours. Would follow-up with 24-hour film, as per small-bowel obstruction protocol. 2. Persistent diffuse distention of small bowel loops throughout the abdomen, compatible with previously noted small bowel obstruction. 3. Contrast within the cecum and ascending colon (from the CT performed 02/07/2015) has not changed in position  since the prior CT from earlier this morning, concerning for significant underlying colonic dysmotility.   Electronically Signed   By: Garald Balding M.D.   On: 02/11/2015 02:18   Dg Abd Portable 1v-small Bowel Protocol-position Verification  02/10/2015   CLINICAL DATA:  NG tube placement  EXAM: PORTABLE ABDOMEN - 1 VIEW  COMPARISON:  CT 02/10/2015  FINDINGS: NG tube is in place with the tip in the proximal to mid stomach. Dilated small bowel loops in the  abdomen and upper pelvis compatible with small bowel obstruction. Oral contrast material seen within the right colon compatible with partial small bowel obstruction.  IMPRESSION: NG tube tip in the proximal to mid stomach.  Partial small bowel obstruction pattern.   Electronically Signed   By: Rolm Baptise M.D.   On: 02/10/2015 12:11    Medications / Allergies:  Scheduled Meds: . antiseptic oral rinse  7 mL Mouth Rinse BID  . bisacodyl  10 mg Rectal Daily  . lip balm  1 application Topical BID  . lubiprostone  24 mcg Oral BID WC   Continuous Infusions: . 0.9 % NaCl with KCl 40 mEq / L 100 mL/hr (02/10/15 1227)   PRN Meds:.[DISCONTINUED] acetaminophen **OR** acetaminophen, alum & mag hydroxide-simeth, lactated ringers, magic mouthwash, menthol-cetylpyridinium, metoCLOPramide (REGLAN) injection, morphine injection, ondansetron **OR** ondansetron (ZOFRAN) IV, phenol, promethazine  Antibiotics: Anti-infectives    None        Assessment/Plan PSBO -no progress of contrast at 8 hours.  Will repeat a AXR at 24h which is about 1500 today.  If still no progress, will proceed with ex lap in AM -I have tentatively scheduled the patient tomorrow morning and have discussed with her and daughter -continue NGT decompression -ice chips -pain control     Erby Pian, ANP-BC [facility] Pager [phone](7A-4:30P)   02/11/2015 10:51 AM

## 2015-02-12 ENCOUNTER — Inpatient Hospital Stay (HOSPITAL_COMMUNITY): Payer: 59 | Admitting: Anesthesiology

## 2015-02-12 ENCOUNTER — Encounter (HOSPITAL_COMMUNITY)
Admission: EM | Disposition: A | Discharge status: Home / Self Care | Payer: Self-pay | Source: Home / Self Care | Attending: Internal Medicine

## 2015-02-12 ENCOUNTER — Encounter (HOSPITAL_COMMUNITY): Payer: Self-pay | Admitting: Certified Registered Nurse Anesthetist

## 2015-02-12 ENCOUNTER — Inpatient Hospital Stay (HOSPITAL_COMMUNITY): Payer: 59

## 2015-02-12 HISTORY — PX: LAPAROSCOPIC PARTIAL COLECTOMY: SHX5907

## 2015-02-12 LAB — COMPREHENSIVE METABOLIC PANEL
ALT: 11 U/L (ref 0–35)
AST: 17 U/L (ref 0–37)
Albumin: 3.1 g/dL — ABNORMAL LOW (ref 3.5–5.2)
Alkaline Phosphatase: 43 U/L (ref 39–117)
Anion gap: 7 (ref 5–15)
BUN: 12 mg/dL (ref 6–23)
CO2: 27 mmol/L (ref 19–32)
Calcium: 8.4 mg/dL (ref 8.4–10.5)
Chloride: 106 mmol/L (ref 96–112)
Creatinine, Ser: 0.76 mg/dL (ref 0.50–1.10)
GFR calc Af Amer: >90 mL/min (ref >90)
GFR calc non Af Amer: 79 mL/min — ABNORMAL LOW (ref >90)
Glucose, Bld: 91 mg/dL (ref 70–99)
Potassium: 3.6 mmol/L (ref 3.5–5.1)
Sodium: 140 mmol/L (ref 135–145)
Total Bilirubin: 0.8 mg/dL (ref 0.3–1.2)
Total Protein: 5.6 g/dL — ABNORMAL LOW (ref 6.0–8.3)

## 2015-02-12 LAB — CBC
HCT: 36.7 % (ref 36.0–46.0)
Hemoglobin: 12 g/dL (ref 12.0–15.0)
MCH: 30.3 pg (ref 26.0–34.0)
MCHC: 32.7 g/dL (ref 30.0–36.0)
MCV: 92.7 fL (ref 78.0–100.0)
Platelets: 383 10*3/uL (ref 150–400)
RBC: 3.96 MIL/uL (ref 3.87–5.11)
RDW: 14 % (ref 11.5–15.5)
WBC: 6.9 10*3/uL (ref 4.0–10.5)

## 2015-02-12 LAB — SURGICAL PCR SCREEN
MRSA, PCR: NEGATIVE
Staphylococcus aureus: NEGATIVE

## 2015-02-12 SURGERY — LAPAROSCOPIC PARTIAL COLECTOMY
Anesthesia: General | Site: Abdomen

## 2015-02-12 MED ORDER — LACTATED RINGERS IV SOLN
INTRAVENOUS | Status: DC | PRN
  Administered 2015-02-12 (×3): via INTRAVENOUS

## 2015-02-12 MED ORDER — EPHEDRINE SULFATE 50 MG/ML IJ SOLN
INTRAMUSCULAR | Status: AC
Start: 2015-02-12 — End: 2015-02-12
  Filled 2015-02-12: qty 1

## 2015-02-12 MED ORDER — DEXAMETHASONE SODIUM PHOSPHATE 10 MG/ML IJ SOLN
INTRAMUSCULAR | Status: DC | PRN
  Administered 2015-02-12: 10 mg via INTRAVENOUS

## 2015-02-12 MED ORDER — BISACODYL 10 MG RE SUPP: 10.0000 mg | Freq: Two times a day (BID) | RECTAL | Status: DC | PRN

## 2015-02-12 MED ORDER — ATROPINE SULFATE 0.4 MG/ML IJ SOLN
INTRAMUSCULAR | Status: AC
Start: 2015-02-12 — End: 2015-02-12
  Filled 2015-02-12: qty 1

## 2015-02-12 MED ORDER — GLYCOPYRROLATE 0.2 MG/ML IJ SOLN
INTRAMUSCULAR | Status: DC | PRN
Start: 2015-02-12 — End: 2015-02-12
  Administered 2015-02-12: .6 mg via INTRAVENOUS

## 2015-02-12 MED ORDER — METRONIDAZOLE IN NACL 5-0.79 MG/ML-% IV SOLN
INTRAVENOUS | Status: AC
  Filled 2015-02-12: qty 100

## 2015-02-12 MED ORDER — NEOSTIGMINE METHYLSULFATE 10 MG/10ML IV SOLN
INTRAVENOUS | Status: DC | PRN
  Administered 2015-02-12: 5 mg via INTRAVENOUS

## 2015-02-12 MED ORDER — HYDROCODONE-ACETAMINOPHEN 5-325 MG PO TABS: 1.0000 | ORAL_TABLET | Freq: Four times a day (QID) | ORAL | Status: DC | PRN

## 2015-02-12 MED ORDER — HYDROMORPHONE HCL 1 MG/ML IJ SOLN
0.2500 mg | INTRAMUSCULAR | Status: DC | PRN
  Administered 2015-02-12 (×3): 0.5 mg via INTRAVENOUS
  Administered 2015-02-12 (×2): 0.25 mg via INTRAVENOUS

## 2015-02-12 MED ORDER — EPHEDRINE SULFATE 50 MG/ML IJ SOLN
INTRAMUSCULAR | Status: DC | PRN
  Administered 2015-02-12: 5 mg via INTRAVENOUS

## 2015-02-12 MED ORDER — SUCCINYLCHOLINE CHLORIDE 20 MG/ML IJ SOLN
INTRAMUSCULAR | Status: DC | PRN
  Administered 2015-02-12: 100 mg via INTRAVENOUS

## 2015-02-12 MED ORDER — PROPOFOL 10 MG/ML IV BOLUS
INTRAVENOUS | Status: AC
  Filled 2015-02-12: qty 20

## 2015-02-12 MED ORDER — ACETAMINOPHEN 10 MG/ML IV SOLN
1000.0000 mg | Freq: Once | INTRAVENOUS | Status: AC
  Administered 2015-02-12: 1000 mg via INTRAVENOUS
  Filled 2015-02-12: qty 100

## 2015-02-12 MED ORDER — SODIUM CHLORIDE 0.9 % IJ SOLN
INTRAMUSCULAR | Status: AC
  Filled 2015-02-12: qty 10

## 2015-02-12 MED ORDER — LIDOCAINE HCL (CARDIAC) 20 MG/ML IV SOLN
INTRAVENOUS | Status: AC
  Filled 2015-02-12: qty 5

## 2015-02-12 MED ORDER — ONDANSETRON HCL 4 MG/2ML IJ SOLN
INTRAMUSCULAR | Status: DC | PRN
  Administered 2015-02-12: 4 mg via INTRAVENOUS

## 2015-02-12 MED ORDER — FENTANYL CITRATE 0.05 MG/ML IJ SOLN
INTRAMUSCULAR | Status: AC
  Filled 2015-02-12: qty 5

## 2015-02-12 MED ORDER — CEFAZOLIN SODIUM-DEXTROSE 2-3 GM-% IV SOLR
INTRAVENOUS | Status: AC
  Filled 2015-02-12: qty 50

## 2015-02-12 MED ORDER — METRONIDAZOLE IN NACL 5-0.79 MG/ML-% IV SOLN
500.0000 mg | INTRAVENOUS | Status: AC
  Administered 2015-02-12: 500 mg via INTRAVENOUS
  Filled 2015-02-12: qty 100

## 2015-02-12 MED ORDER — LIDOCAINE HCL (CARDIAC) 20 MG/ML IV SOLN
INTRAVENOUS | Status: AC
Start: 2015-02-12 — End: 2015-02-12
  Filled 2015-02-12: qty 10

## 2015-02-12 MED ORDER — PROMETHAZINE HCL 25 MG/ML IJ SOLN: 6.2500 mg | INTRAMUSCULAR | Status: DC | PRN

## 2015-02-12 MED ORDER — ROCURONIUM BROMIDE 100 MG/10ML IV SOLN
INTRAVENOUS | Status: AC
  Filled 2015-02-12: qty 1

## 2015-02-12 MED ORDER — ONDANSETRON HCL 4 MG/2ML IJ SOLN
INTRAMUSCULAR | Status: AC
  Filled 2015-02-12: qty 2

## 2015-02-12 MED ORDER — DEXAMETHASONE SODIUM PHOSPHATE 10 MG/ML IJ SOLN
INTRAMUSCULAR | Status: AC
  Filled 2015-02-12: qty 1

## 2015-02-12 MED ORDER — BUPIVACAINE-EPINEPHRINE 0.25% -1:200000 IJ SOLN
INTRAMUSCULAR | Status: DC | PRN
  Administered 2015-02-12: 50 mL

## 2015-02-12 MED ORDER — GLYCOPYRROLATE 0.2 MG/ML IJ SOLN
INTRAMUSCULAR | Status: AC
  Filled 2015-02-12: qty 3

## 2015-02-12 MED ORDER — ROCURONIUM BROMIDE 100 MG/10ML IV SOLN
INTRAVENOUS | Status: DC | PRN
  Administered 2015-02-12 (×2): 20 mg via INTRAVENOUS

## 2015-02-12 MED ORDER — NEOSTIGMINE METHYLSULFATE 10 MG/10ML IV SOLN
INTRAVENOUS | Status: AC
  Filled 2015-02-12: qty 1

## 2015-02-12 MED ORDER — OXYCODONE HCL 5 MG/5ML PO SOLN: 5.0000 mg | Freq: Once | ORAL | Status: DC | PRN

## 2015-02-12 MED ORDER — METRONIDAZOLE IN NACL 5-0.79 MG/ML-% IV SOLN
INTRAVENOUS | Status: DC | PRN
  Administered 2015-02-12: 500 mg via INTRAVENOUS

## 2015-02-12 MED ORDER — DIPHENHYDRAMINE HCL 50 MG/ML IJ SOLN: 12.5000 mg | Freq: Four times a day (QID) | INTRAMUSCULAR | Status: DC | PRN

## 2015-02-12 MED ORDER — BUPIVACAINE-EPINEPHRINE 0.25% -1:200000 IJ SOLN
INTRAMUSCULAR | Status: AC
  Filled 2015-02-12: qty 1

## 2015-02-12 MED ORDER — HYDROMORPHONE HCL 1 MG/ML IJ SOLN
INTRAMUSCULAR | Status: AC
  Filled 2015-02-12: qty 1

## 2015-02-12 MED ORDER — MIDAZOLAM HCL 5 MG/5ML IJ SOLN
INTRAMUSCULAR | Status: DC | PRN
  Administered 2015-02-12: .25 mg via INTRAVENOUS

## 2015-02-12 MED ORDER — FENTANYL CITRATE 0.05 MG/ML IJ SOLN
INTRAMUSCULAR | Status: DC | PRN
  Administered 2015-02-12: 50 ug via INTRAVENOUS
  Administered 2015-02-12 (×2): 25 ug via INTRAVENOUS

## 2015-02-12 MED ORDER — SODIUM CHLORIDE 0.9 % IV SOLN
INTRAVENOUS | Status: DC
  Filled 2015-02-12: qty 6

## 2015-02-12 MED ORDER — LACTATED RINGERS IR SOLN
Status: DC | PRN
  Administered 2015-02-12: 1000 mL

## 2015-02-12 MED ORDER — CISATRACURIUM BESYLATE 20 MG/10ML IV SOLN
INTRAVENOUS | Status: AC
  Filled 2015-02-12: qty 10

## 2015-02-12 MED ORDER — 0.9 % SODIUM CHLORIDE (POUR BTL) OPTIME
TOPICAL | Status: DC | PRN
  Administered 2015-02-12: 1000 mL

## 2015-02-12 MED ORDER — LIDOCAINE HCL (CARDIAC) 20 MG/ML IV SOLN
INTRAVENOUS | Status: DC | PRN
  Administered 2015-02-12: 75 mg via INTRAVENOUS

## 2015-02-12 MED ORDER — LIDOCAINE HCL 2 % EX GEL
1.0000 "application " | CUTANEOUS | Status: AC
  Filled 2015-02-12: qty 5

## 2015-02-12 MED ORDER — OXYCODONE HCL 5 MG PO TABS: 5.0000 mg | ORAL_TABLET | Freq: Once | ORAL | Status: DC | PRN

## 2015-02-12 MED ORDER — EPHEDRINE SULFATE 50 MG/ML IJ SOLN
INTRAMUSCULAR | Status: AC
  Filled 2015-02-12: qty 1

## 2015-02-12 MED ORDER — HYDROMORPHONE HCL 1 MG/ML IJ SOLN
0.5000 mg | INTRAMUSCULAR | Status: DC | PRN
  Administered 2015-02-13 – 2015-02-14 (×3): 1 mg via INTRAVENOUS
  Filled 2015-02-12 (×3): qty 1

## 2015-02-12 MED ORDER — PROPOFOL 10 MG/ML IV BOLUS
INTRAVENOUS | Status: DC | PRN
  Administered 2015-02-12: 125 mg via INTRAVENOUS

## 2015-02-12 MED ORDER — MIDAZOLAM HCL 2 MG/2ML IJ SOLN
INTRAMUSCULAR | Status: AC
  Filled 2015-02-12: qty 2

## 2015-02-12 SURGICAL SUPPLY — 72 items
APPLIER CLIP 5 13 M/L LIGAMAX5 (MISCELLANEOUS)
APPLIER CLIP ROT 10 11.4 M/L (STAPLE)
APR CLP MED LRG 11.4X10 (STAPLE)
APR CLP MED LRG 5 ANG JAW (MISCELLANEOUS)
CABLE HIGH FREQUENCY MONO STRZ (ELECTRODE) ×2 IMPLANT
CATH KIT ON-Q SILVERSOAK 7.5 (CATHETERS) IMPLANT
CATH KIT ON-Q SILVERSOAK 7.5IN (CATHETERS) IMPLANT
CELLS DAT CNTRL 66122 CELL SVR (MISCELLANEOUS) IMPLANT
CHLORAPREP W/TINT 26ML (MISCELLANEOUS) ×2 IMPLANT
CLIP APPLIE 5 13 M/L LIGAMAX5 (MISCELLANEOUS) IMPLANT
CLIP APPLIE ROT 10 11.4 M/L (STAPLE) IMPLANT
COUNTER NEEDLE 20 DBL MAG RED (NEEDLE) ×1 IMPLANT
DECANTER SPIKE VIAL GLASS SM (MISCELLANEOUS) ×2 IMPLANT
DRAIN CHANNEL 19F RND (DRAIN) IMPLANT
DRAPE LAPAROSCOPIC ABDOMINAL (DRAPES) ×2 IMPLANT
DRAPE SURG IRRIG POUCH 19X23 (DRAPES) ×2 IMPLANT
DRAPE UTILITY XL STRL (DRAPES) ×4 IMPLANT
DRSG OPSITE POSTOP 4X10 (GAUZE/BANDAGES/DRESSINGS) IMPLANT
DRSG OPSITE POSTOP 4X6 (GAUZE/BANDAGES/DRESSINGS) IMPLANT
DRSG OPSITE POSTOP 4X8 (GAUZE/BANDAGES/DRESSINGS) IMPLANT
DRSG TEGADERM 2-3/8X2-3/4 SM (GAUZE/BANDAGES/DRESSINGS) ×7 IMPLANT
DRSG TEGADERM 4X4.75 (GAUZE/BANDAGES/DRESSINGS) ×1 IMPLANT
ELECT PENCIL ROCKER SW 15FT (MISCELLANEOUS) ×2 IMPLANT
ELECT REM PT RETURN 15FT ADLT (MISCELLANEOUS) ×1 IMPLANT
ENDOLOOP SUT PDS II  0 18 (SUTURE)
ENDOLOOP SUT PDS II 0 18 (SUTURE) IMPLANT
EVACUATOR SILICONE 100CC (DRAIN) IMPLANT
GAUZE SPONGE 2X2 8PLY STRL LF (GAUZE/BANDAGES/DRESSINGS) ×1 IMPLANT
GAUZE SPONGE 4X4 12PLY STRL (GAUZE/BANDAGES/DRESSINGS) ×1 IMPLANT
GLOVE ECLIPSE 8.0 STRL XLNG CF (GLOVE) ×4 IMPLANT
GLOVE INDICATOR 8.0 STRL GRN (GLOVE) ×4 IMPLANT
GOWN STRL REUS W/TWL XL LVL3 (GOWN DISPOSABLE) ×10 IMPLANT
LEGGING LITHOTOMY PAIR STRL (DRAPES) ×1 IMPLANT
LUBRICANT JELLY K Y 4OZ (MISCELLANEOUS) IMPLANT
PACK COLON (CUSTOM PROCEDURE TRAY) ×1 IMPLANT
PACK GENERAL/GYN (CUSTOM PROCEDURE TRAY) ×1 IMPLANT
PEN SKIN MARKING BROAD (MISCELLANEOUS) ×2 IMPLANT
PORT LAP GEL ALEXIS MED 5-9CM (MISCELLANEOUS) IMPLANT
RETRACTOR WND ALEXIS 18 MED (MISCELLANEOUS) IMPLANT
RTRCTR WOUND ALEXIS 18CM MED (MISCELLANEOUS)
SCISSORS LAP 5X35 DISP (ENDOMECHANICALS) ×2 IMPLANT
SEALER TISSUE G2 STRG ARTC 35C (ENDOMECHANICALS) IMPLANT
SET IRRIG TUBING LAPAROSCOPIC (IRRIGATION / IRRIGATOR) ×2 IMPLANT
SLEEVE XCEL OPT CAN 5 100 (ENDOMECHANICALS) ×5 IMPLANT
SPONGE GAUZE 2X2 STER 10/PKG (GAUZE/BANDAGES/DRESSINGS) ×2
SPONGE LAP 18X18 X RAY DECT (DISPOSABLE) ×2 IMPLANT
STAPLER VISISTAT 35W (STAPLE) ×1 IMPLANT
SUCTION POOLE TIP (SUCTIONS) ×2 IMPLANT
SUT MNCRL AB 4-0 PS2 18 (SUTURE) ×2 IMPLANT
SUT PDS AB 1 CTX 36 (SUTURE) IMPLANT
SUT PDS AB 1 TP1 96 (SUTURE) IMPLANT
SUT PROLENE 0 CT 2 (SUTURE) IMPLANT
SUT PROLENE 2 0 SH DA (SUTURE) IMPLANT
SUT SILK 2 0 (SUTURE) ×2
SUT SILK 2 0 SH CR/8 (SUTURE) ×2 IMPLANT
SUT SILK 2-0 18XBRD TIE 12 (SUTURE) ×1 IMPLANT
SUT SILK 3 0 (SUTURE) ×2
SUT SILK 3 0 SH CR/8 (SUTURE) ×2 IMPLANT
SUT SILK 3-0 18XBRD TIE 12 (SUTURE) ×1 IMPLANT
SUT VICRYL 0 UR6 27IN ABS (SUTURE) IMPLANT
SYS LAPSCP GELPORT 120MM (MISCELLANEOUS)
SYSTEM LAPSCP GELPORT 120MM (MISCELLANEOUS) IMPLANT
TAPE UMBILICAL COTTON 1/8X30 (MISCELLANEOUS) IMPLANT
TOWEL OR 17X26 10 PK STRL BLUE (TOWEL DISPOSABLE) ×3 IMPLANT
TOWEL OR NON WOVEN STRL DISP B (DISPOSABLE) ×3 IMPLANT
TRAY FOLEY CATH 14FRSI W/METER (CATHETERS) ×2 IMPLANT
TRAY LAP CHOLE (CUSTOM PROCEDURE TRAY) ×1 IMPLANT
TROCAR BLADELESS OPT 5 100 (ENDOMECHANICALS) ×2 IMPLANT
TROCAR XCEL NON-BLD 11X100MML (ENDOMECHANICALS) ×2 IMPLANT
TUBING CONNECTING 10 (TUBING) IMPLANT
TUBING FILTER THERMOFLATOR (ELECTROSURGICAL) ×2 IMPLANT
TUNNELER SHEATH ON-Q 16GX12 DP (PAIN MANAGEMENT) IMPLANT

## 2015-02-12 NOTE — Op Note (Signed)
9:44 AM  PATIENT:  Danielle Oconnell  80 y.o. female  Patient Care Team: Lavone Orn, MD as PCP - General (Internal Medicine)  PRE-OPERATIVE DIAGNOSIS:  SMALL BOWEL OBSTRUCTION  POST-OPERATIVE DIAGNOSIS:  SMALL BOWEL OBSTRUCTION  PROCEDURE:    DIAGNOSTIC LAPAROSCOPY WITH LYSIS OF ADHESIONS  SURGEON:  Surgeon(s): Michael Boston, MD  Alphonsa Overall, MD - Asst  ANESTHESIA:   local and general  EBL:  Total I/O In: 1000 [I.V.:1000] Out: -   Delay start of Pharmacological VTE agent (>24hrs) due to surgical blood loss or risk of bleeding:  no  DRAINS: Gastrostomy Tube   SPECIMEN:  NONE  DISPOSITION OF SPECIMEN: N/A  COUNTS:  YES  PLAN OF CARE: Admit to inpatient   PATIENT DISPOSITION:  PACU - hemodynamically stable.   INDICATIONS:  Patient with concerning symptoms & work up suspicious for SBO.  Some partial improvement by x-ray and exam but still with significant abdominal pain and high nasogastric tube output >36hours.  Per SBO protocol, surgery was recommended:  The anatomy & physiology of the digestive tract was discussed.  The pathophysiology of perforation was discussed.  Differential diagnosis such as perforated ulcer or colon, etc was discussed.   Natural history risks without surgery such as death was discussed.  I recommended abdominal exploration to diagnose & treat the source of the problem.  Laparoscopic & open techniques were discussed.   Risks such as bleeding, infection, abscess, leak, reoperation, bowel resection, possible ostomy, hernia, heart attack, death, and other risks were discussed.   The risks of no intervention will lead to serious problems including death.   I expressed a good likelihood that surgery will address the problem.    Goals of post-operative recovery were discussed as well.  We will work to minimize complications although risks in an emergent setting are high.   Questions were answered.  The patient expressed understanding & wishes to proceed  with surgery.       OR FINDINGS: Low midline anterior abdominal wall adhesions with partial volvulized in bowel.  Suspicious for partial closed loop obstruction.  Inflammation but no ischemia.  Adhesion from right ovary to sigmoid colon providing a another transition point.  Some small bowel within it but not definite transition point.  No evidence of perforation ischemia or necrosis.  DESCRIPTION:   The patient was identified & brought into the operating room. The patient was positioned supine with arms tucked. SCDs were active during the entire case. The patient underwent general anesthesia without any difficulty.  The nasogastric tube was clogged.  Replace with a larger one to better result.  The abdomen was prepped and draped in a sterile fashion. A Surgical Timeout confirmed our plan.  I placed a 84mm port in the left upper quadrant using optical entry technique with the patient in reverse Trendelenburg positioning & left side up..  We induced carbon dioxide insufflation.  Camera inspection revealed no injury.  I placed additional 76mm ports under direct laparoscopic visualization.  Patient had obviously dilated bowel with some inflammation.  Moderate ascites.  Perhaps mild bilious tinge but nothing severe.  He could see obvious adhesions to the lower midline.  Some inflamed twisted small bowel adherent to this.  The small bowel was sharply freed off the adhesions on the anterior low midline.  That seemed to be the transition point.  I was able to find the cecum.  At freed adhesions of the terminal ileum to the jejunum to help straighten it out.  I ran  decompressed ileum more proximally.  I came to some interloop adhesions that had been released off the anterior bowel wall.  I further ran the small bowel to the ligament of Treitz.  Seen more decompressed proximally.  Again suspicious for something more closed loop.  Looked down the pelvis.  There is an obvious adhesive band of the right  fallopian tube to the posterior sigmoid mesentery.  That band was transected off both ends and removed.  No evidence of any ischemia on the sigmoid colon.  I again ran the small bowel from the ileocecal valve to the ligament of Treitz.  I freed off some interloop adhesions to help straighten the area off.  No evidence of ischemia or perforation or necrosis.  The bowel seemed to be more normalized and less inflamed.  We did reinspection the upper abdomen.  She had very minimal greater omentum adherent to the left upper quadrant.  Upper abdomen normal.  I aspirated all the ascites.  I did copious irrigation with clear return.  No bleeding.  No injury.  No perforation.  No peritonitis.  I aspirated the carbon dioxide. I removed the ports.  I closed skin using 4-0 monocryl stitch.  Patient was extubated and sent to the recovery room.  We will look for family discussed postoperative instructions in interrupted findings.  Adin Hector, M.D., F.A.C.S. Gastrointestinal and Minimally Invasive Surgery [facility] [address], [place], [place] [address] [phone] Main / Paging

## 2015-02-12 NOTE — Anesthesia Postprocedure Evaluation (Signed)
  Anesthesia Post-op Note  Patient: Danielle Oconnell  Procedure(s) Performed: Procedure(s): DIAGNOSTIC LAPAROSCOPY WITH LYSIS OF ADHESIONS (N/A)  Patient Location: PACU  Anesthesia Type:General  Level of Consciousness: awake and alert   Airway and Oxygen Therapy: Patient Spontanous Breathing  Post-op Pain: none  Post-op Assessment: Post-op Vital signs reviewed  Post-op Vital Signs: Reviewed  Last Vitals:  Filed Vitals:   02/12/15 1139  BP: 109/57  Pulse:   Temp: 36.3 C  Resp:     Complications: No apparent anesthesia complications

## 2015-02-12 NOTE — Anesthesia Procedure Notes (Signed)
Procedure Name: Intubation Date/Time: 02/12/2015 8:28 AM Performed by: Ofilia Neas Pre-anesthesia Checklist: Patient identified, Emergency Drugs available, Suction available and Patient being monitored Patient Re-evaluated:Patient Re-evaluated prior to inductionOxygen Delivery Method: Circle system utilized Preoxygenation: Pre-oxygenation with 100% oxygen Intubation Type: IV induction and Rapid sequence Laryngoscope Size: Mac and 4 Grade View: Grade II Tube type: Oral Tube size: 7.0 mm Number of attempts: 1 Placement Confirmation: positive ETCO2,  CO2 detector and breath sounds checked- equal and bilateral Secured at: 22 cm Tube secured with: Tape Dental Injury: Teeth and Oropharynx as per pre-operative assessment

## 2015-02-12 NOTE — Anesthesia Preprocedure Evaluation (Addendum)
Anesthesia Evaluation  Patient identified by MRN, date of birth, ID band Patient awake    Reviewed: Allergy & Precautions, NPO status , Patient's Chart, lab work & pertinent test results  Airway Mallampati: III  TM Distance: >3 FB Neck ROM: Full    Dental  (+) Teeth Intact, Dental Advisory Given   Pulmonary neg pulmonary ROS,  breath sounds clear to auscultation        Cardiovascular negative cardio ROS  Rhythm:Regular Rate:Normal     Neuro/Psych negative neurological ROS     GI/Hepatic Neg liver ROS, SBO   Endo/Other  Morbid obesity  Renal/GU negative Renal ROS     Musculoskeletal negative musculoskeletal ROS (+)   Abdominal   Peds  Hematology negative hematology ROS (+)   Anesthesia Other Findings   Reproductive/Obstetrics                            Anesthesia Physical Anesthesia Plan  ASA: III  Anesthesia Plan: General   Post-op Pain Management:    Induction: Intravenous and Rapid sequence  Airway Management Planned: Oral ETT  Additional Equipment:   Intra-op Plan:   Post-operative Plan: Extubation in OR  Informed Consent: I have reviewed the patients History and Physical, chart, labs and discussed the procedure including the risks, benefits and alternatives for the proposed anesthesia with the patient or authorized representative who has indicated his/her understanding and acceptance.   Dental advisory given  Plan Discussed with: CRNA  Anesthesia Plan Comments:         Anesthesia Quick Evaluation

## 2015-02-12 NOTE — Transfer of Care (Signed)
Immediate Anesthesia Transfer of Care Note  Patient: Danielle Oconnell  Procedure(s) Performed: Procedure(s): DIAGNOSTIC LAPAROSCOPY WITH LYSIS OF ADHESIONS (N/A)  Patient Location: PACU  Anesthesia Type:General  Level of Consciousness: awake, oriented, patient cooperative, lethargic and responds to stimulation  Airway & Oxygen Therapy: Patient Spontanous Breathing and Patient connected to face mask oxygen  Post-op Assessment: Report given to RN, Post -op Vital signs reviewed and stable and Patient moving all extremities  Post vital signs: Reviewed and stable  Last Vitals:  Filed Vitals:   02/12/15 0601  BP: 121/63  Pulse: 70  Temp: 36.8 C  Resp: 18    Complications: No apparent anesthesia complications

## 2015-02-12 NOTE — Progress Notes (Signed)
PROGRESS NOTE  Danielle Oconnell UDJ:[id number] DOB: [birth year] DOA: 02/10/2015 PCP: Irven Shelling, MD  HPI: 80 y.o. Female with past medical history of irritable bowel syndrome, constipation who presented to [facility] ED with worsening abdominal pain with associated nausea and nonbloody vomiting. Patient was seen in emergency room you days ago, she had CT scan done which did not reveal acute findings and she subsequently was discharged home. Her symptoms did not improve. She came back and found to have an SBO  Subjective / 24 H Interval events - seen post op, doing well  Assessment/Plan: Principal Problem:   Partial small bowel obstruction Active Problems:   Nausea and vomiting  Partial small bowel obstruction - s/p diagnostic laparoscopy 3/25 with lysis of adhesions  - Continue supportive care with IV fluids, analgesia and antiemetics as needed. - Appreciate surgery consult and recommendations.  Nausea and vomiting, abdominal bloating  - Secondary to SBO as noted above   Diet: Diet NPO time specified Diet - low sodium heart healthy Fluids: NS 100 cc/h DVT Prophylaxis: SCD  Code Status: Full Code Family Communication: d/w daughter bedside Disposition Plan: inpatient  Consultants:  General surgery   Procedures:  None    Antibiotics  Anti-infectives    Start     Dose/Rate Route Frequency Ordered Stop   02/12/15 0800  metroNIDAZOLE (FLAGYL) IVPB 500 mg    Comments:  Pharmacy may adjust dosing strength, interval, or rate of medication as needed for optimal therapy for the patient Send with patient on call to the OR.  Anesthesia to complete antibiotic administration <54min prior to incision per [facility]   500 mg 100 mL/hr over 60 Minutes Intravenous On call to O.R. 02/12/15 5885 02/12/15 0815   02/12/15 0715  clindamycin (CLEOCIN) 900 mg, gentamicin (GARAMYCIN) 240 mg in sodium chloride 0.9 % 1,000 mL for intraperitoneal lavage  Status:  Discontinued     Intraperitoneal To Surgery 02/12/15 0714 02/12/15 0752   02/12/15 0600  ceFAZolin (ANCEF) IVPB 2 g/50 mL premix    Comments:  Pharmacy may adjust dosing strength, interval, or rate of medication as needed for optimal therapy for the patient  Send with patient on call to the OR.  Anesthesia to complete antibiotic administration <71min prior to incision per [facility]   2 g 100 mL/hr over 30 Minutes Intravenous On call to O.R. 02/11/15 1353 02/12/15 0758       Studies  Dg Abd Portable 1v  02/12/2015   CLINICAL DATA:  Known bowel obstruction, preoperative evaluation  EXAM: PORTABLE ABDOMEN - 1 VIEW  COMPARISON:  02/10/2015  FINDINGS: A nasogastric catheter is noted but does not extend into the stomach. This has withdrawn in the interval from the prior exam. Contrast material is noted throughout the colon from the prior CT of 2 days previous. Persistent dilated small bowel is noted in the left abdomen. No free air is seen. No bony abnormality is noted.  IMPRESSION: Persistent dilated small bowel on the left. The overall appearance has improved in the interval from the prior exam. These changes are consistent with a partial small bowel obstruction. The nasogastric catheter has withdrawn into the distal esophagus.   Electronically Signed   By: Inez Catalina M.D.   On: 02/12/2015 07:57   Dg Abd Portable 1v-small Bowel Obstruction Protocol-initial, 8 Hr Delay  02/11/2015   CLINICAL DATA:  Small bowel obstruction protocol. 8 hour delayed film, status post Gastrografin administration. Assess for passage of contrast. Subsequent encounter.  EXAM: PORTABLE  ABDOMEN - 1 VIEW  COMPARISON:  Abdominal radiograph performed earlier today at 11:33 a.m., and CT of the abdomen and pelvis performed earlier today at 6:15 a.m.  FINDINGS: Contrast infused into the stomach remains at the fundus of the stomach. 90 mL of Gastrografin contrast was administered.  There is persistent diffuse distention of small bowel loops  throughout the abdomen, compatible with previously noted small bowel obstruction. Contrast within the cecum and ascending colon reflects the study from March 20th; this contrast has not changed in position since the recent prior study, concerning for significant colonic dysmotility.  No acute osseous abnormalities are seen. Contrast is seen partially filling the bladder. The patient's enteric tube is noted ending overlying the body of the stomach.  IMPRESSION: 1. Contrast infused into the stomach remains at the fundus of the stomach, after 8 hours. Would follow-up with 24-hour film, as per small-bowel obstruction protocol. 2. Persistent diffuse distention of small bowel loops throughout the abdomen, compatible with previously noted small bowel obstruction. 3. Contrast within the cecum and ascending colon (from the CT performed 02/07/2015) has not changed in position since the prior CT from earlier this morning, concerning for significant underlying colonic dysmotility.   Electronically Signed   By: Garald Balding M.D.   On: 02/11/2015 02:18    Objective  Filed Vitals:   02/12/15 1045 02/12/15 1100 02/12/15 1115 02/12/15 1139  BP: 126/54 119/56 110/59 109/57  Pulse: 66 64 66   Temp:    97.4 F (36.3 C)  TempSrc:      Resp: 17 13 14    Height:      Weight:      SpO2: 95% 94% 95%     Intake/Output Summary (Last 24 hours) at 02/12/15 1244 Last data filed at 02/12/15 1124  Gross per 24 hour  Intake   4890 ml  Output   1650 ml  Net   3240 ml   Filed Weights   02/10/15 0824 02/11/15 1007  Weight: 82.464 kg (181 lb 12.8 oz) 81.738 kg (180 lb 3.2 oz)    Exam:  General:  NAD, drowsy post op  HEENT: no scleral icterus, PERRL  Cardiovascular: RRR without MRG, 2+ peripheral pulses, no edema  Respiratory: CTA biL, good air movement, no wheezing, no crackles, no rales  Abdomen: deferred as just back from OR  Neuro: non focal  Data Reviewed: Basic Metabolic Panel:  Recent Labs Lab  02/07/15 1804 02/10/15 0517 02/11/15 0525 02/12/15 0525  NA 142 140 140 140  K 3.4* 3.5 4.2 3.6  CL 106 98 102 106  CO2 29  --  30 27  GLUCOSE 107* 122* 103* 91  BUN 9 11 12 12   CREATININE 0.78 0.90 0.80 0.76  CALCIUM 9.6  --  8.8 8.4   Liver Function Tests:  Recent Labs Lab 02/07/15 1804 02/11/15 0525 02/12/15 0525  AST 18 18 17   ALT 12 10 11   ALKPHOS 63 48 43  BILITOT 0.5 0.8 0.8  PROT 6.9 6.1 5.6*  ALBUMIN 4.3 3.7 3.1*    Recent Labs Lab 02/07/15 1815  LIPASE 29   CBC:  Recent Labs Lab 02/07/15 1804 02/10/15 0510 02/10/15 0517 02/11/15 0525 02/12/15 0525  WBC 10.3 9.5  --  6.8 6.9  NEUTROABS 7.5 7.3  --   --   --   HGB 13.5 13.9 15.3* 13.2 12.0  HCT 41.8 42.9 45.0 40.9 36.7  MCV 90.9 90.9  --  92.1 92.7  PLT 445* 448*  --  425* 383   CBG:  Recent Labs Lab 02/11/15 0730  GLUCAP 103*   Scheduled Meds: . antiseptic oral rinse  7 mL Mouth Rinse BID  . bisacodyl  10 mg Rectal Daily  . HYDROmorphone      . HYDROmorphone      . lidocaine  1 application Topical To OR  . lip balm  1 application Topical BID  . lubiprostone  24 mcg Oral BID WC   Continuous Infusions: . sodium chloride 100 mL/hr at 02/11/15 2138    Marzetta Board, MD [facility] Pager [phone]. If 7 PM - 7 AM, please contact night-coverage at [email or web address], password [facility] 02/12/2015, 12:44 PM  LOS: 2 days

## 2015-02-12 NOTE — Progress Notes (Signed)
Received report, Pt arrived from PACU, accompanied by family. Alert and oriented, NG Tube in place, VSS, will continue with current plan of care.

## 2015-02-12 NOTE — Progress Notes (Signed)
[place]  [address] [place], [place] [address] Phone: [phone] FAX: [phone]    Danielle Oconnell [id number] [birth year]  CARE TEAM:  PCP: Irven Shelling, MD  Outpatient Care Team: Patient Care Team: Lavone Orn, MD as PCP - General (Internal Medicine)  Inpatient Treatment Team: Treatment Team: Attending Provider: Caren Griffins, MD; Attending Physician: Robbie Lis, MD; Rounding Team: Nolon Nations, MD; Technician: Val Riles, NT; Registered Nurse: Vivi Martens, RN; Rounding Team: Ian Bushman, MD; Technician: Wilder Glade, NT; Licensed Practical Nurse: Marcina Millard, LPN; Registered Nurse: Dala Dock, RN; Registered Nurse: Revonda Standard, RN  Problem List:   Principal Problem:   Partial small bowel obstruction Active Problems:   Nausea and vomiting   Day of Surgery  Assessment  Persistent small bowel obstruction.  Some improvement by exam and x-ray but with persistent abdominal pain.  Plan:  Laparoscopic possible open exploration.  Possible bowel resection but hopefully not too likely.  I am hesitant to keep observing this woman who has persistent abdominal pain.  She agrees:  The anatomy & physiology of the digestive tract was discussed.  The pathophysiology of perforation was discussed.  Differential diagnosis such as perforated ulcer or colon, etc was discussed.   Natural history risks without surgery such as death was discussed.  I recommended abdominal exploration to diagnose & treat the source of the problem.  Laparoscopic & open techniques were discussed.   Risks such as bleeding, infection, abscess, leak, reoperation, bowel resection, possible ostomy, hernia, heart attack, death, and other risks were discussed.   The risks of no intervention will lead to serious problems including death.   I expressed a good likelihood that surgery will address the problem.    Goals of post-operative recovery  were discussed as well.  We will work to minimize complications although risks in an emergent setting are high.   Questions were answered.  The patient expressed understanding & wishes to proceed with surgery.        -VTE prophylaxis- SCDs, etc -mobilize as tolerated to help recovery  Adin Hector, M.D., F.A.C.S. Gastrointestinal and Minimally Invasive Surgery [facility] [address] [place], [place] [address] [phone] Main / Paging   02/12/2015  Subjective:  Passed flatus 3 times yesterday.  Very small.  No result with enema.  Still high volume from nasogastric tube  Still with crampy abdominal pain.  Objective:  Vital signs:  Filed Vitals:   02/11/15 1007 02/11/15 1421 02/11/15 2116 02/12/15 0601  BP:  147/74 122/61 121/63  Pulse:  70 72 70  Temp:  98.5 F (36.9 C) 98.1 F (36.7 C) 98.3 F (36.8 C)  TempSrc:  Oral Oral Oral  Resp:  _0 Height:      Weight: 81.738 kg (180 lb 3.2 oz)     SpO2:  95% 96% 95%    Last BM Date: 02/07/15  Intake/Output   Yesterday:  03/24 0701 - 03/25 0700 In: 5181.7 [I.V.:4231.7; NG/GT:350] Out: 2550 [Urine:550; Emesis/NG output:2000] This shift:     Bowel function:  Flatus: scant  BM: no  Drain: bilious  Physical Exam:  General: Pt awake/alert/oriented x4 in no acute distress Eyes: PERRL, normal EOM.  Sclera clear.  No icterus Neuro: CN II-XII intact w/o focal sensory/motor deficits. Lymph: No head/neck/groin lymphadenopathy Psych:  No delerium/psychosis/paranoia HENT: Normocephalic, Mucus membranes moist.  No thrush Neck: Supple, No tracheal deviation  Chest: No chest wall pain w good excursion CV:  Pulses intact.  Regular rhythm MS: Normal AROM mjr joints.  No obvious deformity Abdomen: A little more soft.  Moderately distended.  Mildly tender at lower abdomen.  No evidence of peritonitis.  No incarcerated hernias. Ext:  SCDs BLE.  No mjr edema.  No  cyanosis Skin: No petechiae / purpura  Results:   Labs: Results for orders placed or performed during the hospital encounter of 02/10/15 (from the past 48 hour(s))  Comprehensive metabolic panel     Status: Abnormal   Collection Time: 02/11/15  5:25 AM  Result Value Ref Range   Sodium 140 135 - 145 mmol/L   Potassium 4.2 3.5 - 5.1 mmol/L   Chloride 102 96 - 112 mmol/L   CO2 30 19 - 32 mmol/L   Glucose, Bld 103 (H) 70 - 99 mg/dL   BUN 12 6 - 23 mg/dL   Creatinine, Ser 0.80 0.50 - 1.10 mg/dL   Calcium 8.8 8.4 - 10.5 mg/dL   Total Protein 6.1 6.0 - 8.3 g/dL   Albumin 3.7 3.5 - 5.2 g/dL   AST 18 0 - 37 U/L   ALT 10 0 - 35 U/L   Alkaline Phosphatase 48 39 - 117 U/L   Total Bilirubin 0.8 0.3 - 1.2 mg/dL   GFR calc non Af Amer 69 (L) >90 mL/min   GFR calc Af Amer 80 (L) >90 mL/min    Comment: (NOTE) The eGFR has been calculated using the CKD EPI equation. This calculation has not been validated in all clinical situations. eGFR's persistently <90 mL/min signify possible Chronic Kidney Disease.    Anion gap 8 5 - 15  CBC     Status: Abnormal   Collection Time: 02/11/15  5:25 AM  Result Value Ref Range   WBC 6.8 4.0 - 10.5 K/uL   RBC 4.44 3.87 - 5.11 MIL/uL   Hemoglobin 13.2 12.0 - 15.0 g/dL   HCT 40.9 36.0 - 46.0 %   MCV 92.1 78.0 - 100.0 fL   MCH 29.7 26.0 - 34.0 pg   MCHC 32.3 30.0 - 36.0 g/dL   RDW 13.8 11.5 - 15.5 %   Platelets 425 (H) 150 - 400 K/uL  Glucose, capillary     Status: Abnormal   Collection Time: 02/11/15  7:30 AM  Result Value Ref Range   Glucose-Capillary 103 (H) 70 - 99 mg/dL  CBC     Status: None   Collection Time: 02/12/15  5:25 AM  Result Value Ref Range   WBC 6.9 4.0 - 10.5 K/uL   RBC 3.96 3.87 - 5.11 MIL/uL   Hemoglobin 12.0 12.0 - 15.0 g/dL   HCT 36.7 36.0 - 46.0 %   MCV 92.7 78.0 - 100.0 fL   MCH 30.3 26.0 - 34.0 pg   MCHC 32.7 30.0 - 36.0 g/dL   RDW 14.0 11.5 - 15.5 %   Platelets 383 150 - 400 K/uL  Comprehensive metabolic panel      Status: Abnormal   Collection Time: 02/12/15  5:25 AM  Result Value Ref Range   Sodium 140 135 - 145 mmol/L   Potassium 3.6 3.5 - 5.1 mmol/L   Chloride 106 96 - 112 mmol/L   CO2 27 19 - 32 mmol/L   Glucose, Bld 91 70 - 99 mg/dL   BUN 12 6 - 23 mg/dL   Creatinine, Ser 0.76 0.50 - 1.10 mg/dL   Calcium 8.4 8.4 - 10.5 mg/dL  Total Protein 5.6 (L) 6.0 - 8.3 g/dL   Albumin 3.1 (L) 3.5 - 5.2 g/dL   AST 17 0 - 37 U/L   ALT 11 0 - 35 U/L   Alkaline Phosphatase 43 39 - 117 U/L   Total Bilirubin 0.8 0.3 - 1.2 mg/dL   GFR calc non Af Amer 79 (L) >90 mL/min   GFR calc Af Amer >90 >90 mL/min    Comment: (NOTE) The eGFR has been calculated using the CKD EPI equation. This calculation has not been validated in all clinical situations. eGFR's persistently <90 mL/min signify possible Chronic Kidney Disease.    Anion gap 7 5 - 15  Surgical PCR screen     Status: None   Collection Time: 02/12/15  5:27 AM  Result Value Ref Range   MRSA, PCR NEGATIVE NEGATIVE   Staphylococcus aureus NEGATIVE NEGATIVE    Comment:        The Xpert SA Assay (FDA approved for NASAL specimens in patients over 65 years of age), is one component of a comprehensive surveillance program.  Test performance has been validated by [facility] for patients greater than or equal to 86 year old. It is not intended to diagnose infection nor to guide or monitor treatment.     Imaging / Studies: Dg Abd Portable 1v-small Bowel Obstruction Protocol-initial, 8 Hr Delay  02/11/2015   CLINICAL DATA:  Small bowel obstruction protocol. 8 hour delayed film, status post Gastrografin administration. Assess for passage of contrast. Subsequent encounter.  EXAM: PORTABLE ABDOMEN - 1 VIEW  COMPARISON:  Abdominal radiograph performed earlier today at 11:33 a.m., and CT of the abdomen and pelvis performed earlier today at 6:15 a.m.  FINDINGS: Contrast infused into the stomach remains at the fundus of the stomach. 90 mL of Gastrografin  contrast was administered.  There is persistent diffuse distention of small bowel loops throughout the abdomen, compatible with previously noted small bowel obstruction. Contrast within the cecum and ascending colon reflects the study from March 20th; this contrast has not changed in position since the recent prior study, concerning for significant colonic dysmotility.  No acute osseous abnormalities are seen. Contrast is seen partially filling the bladder. The patient's enteric tube is noted ending overlying the body of the stomach.  IMPRESSION: 1. Contrast infused into the stomach remains at the fundus of the stomach, after 8 hours. Would follow-up with 24-hour film, as per small-bowel obstruction protocol. 2. Persistent diffuse distention of small bowel loops throughout the abdomen, compatible with previously noted small bowel obstruction. 3. Contrast within the cecum and ascending colon (from the CT performed 02/07/2015) has not changed in position since the prior CT from earlier this morning, concerning for significant underlying colonic dysmotility.   Electronically Signed   By: Garald Balding M.D.   On: 02/11/2015 02:18   Dg Abd Portable 1v-small Bowel Protocol-position Verification  02/10/2015   CLINICAL DATA:  NG tube placement  EXAM: PORTABLE ABDOMEN - 1 VIEW  COMPARISON:  CT 02/10/2015  FINDINGS: NG tube is in place with the tip in the proximal to mid stomach. Dilated small bowel loops in the abdomen and upper pelvis compatible with small bowel obstruction. Oral contrast material seen within the right colon compatible with partial small bowel obstruction.  IMPRESSION: NG tube tip in the proximal to mid stomach.  Partial small bowel obstruction pattern.   Electronically Signed   By: Rolm Baptise M.D.   On: 02/10/2015 12:11    Medications / Allergies: per chart  Antibiotics: Anti-infectives    Start     Dose/Rate Route Frequency Ordered Stop   02/12/15 0715  clindamycin (CLEOCIN) 900 mg,  gentamicin (GARAMYCIN) 240 mg in sodium chloride 0.9 % 1,000 mL for intraperitoneal lavage      Intraperitoneal To Surgery 02/12/15 0714 02/13/15 0715   02/12/15 0600  ceFAZolin (ANCEF) IVPB 2 g/50 mL premix    Comments:  Pharmacy may adjust dosing strength, interval, or rate of medication as needed for optimal therapy for the patient  Send with patient on call to the OR.  Anesthesia to complete antibiotic administration <1mn [facility]   2 g 100 mL/hr over 30 Minutes Intravenous On call to O.R. 02/11/15 1353 02/13/15 0559       Note: Portions of this report may have been transcribed using voice recognition software. Every effort was made to ensure accuracy; however, inadvertent computerized transcription errors may be present.   Any transcriptional errors that result from this process are unintentional.     SAdin Hector M.D., F.A.C.S. Gastrointestinal and Minimally Invasive Surgery [facility] [address] [place] [phone] / Paging   02/12/2015

## 2015-02-13 LAB — GLUCOSE, CAPILLARY: Glucose-Capillary: 77 mg/dL (ref 70–99)

## 2015-02-13 LAB — BASIC METABOLIC PANEL
Anion gap: 10 (ref 5–15)
BUN: 11 mg/dL (ref 6–23)
CO2: 25 mmol/L (ref 19–32)
Calcium: 8.6 mg/dL (ref 8.4–10.5)
Chloride: 105 mmol/L (ref 96–112)
Creatinine, Ser: 0.65 mg/dL (ref 0.50–1.10)
GFR calc Af Amer: >90 mL/min (ref >90)
GFR calc non Af Amer: 83 mL/min — ABNORMAL LOW (ref >90)
Glucose, Bld: 88 mg/dL (ref 70–99)
Potassium: 3.9 mmol/L (ref 3.5–5.1)
Sodium: 140 mmol/L (ref 135–145)

## 2015-02-13 LAB — CBC
HCT: 36.9 % (ref 36.0–46.0)
Hemoglobin: 11.9 g/dL — ABNORMAL LOW (ref 12.0–15.0)
MCH: 29.5 pg (ref 26.0–34.0)
MCHC: 32.2 g/dL (ref 30.0–36.0)
MCV: 91.6 fL (ref 78.0–100.0)
Platelets: 361 10*3/uL (ref 150–400)
RBC: 4.03 MIL/uL (ref 3.87–5.11)
RDW: 13.7 % (ref 11.5–15.5)
WBC: 7.6 10*3/uL (ref 4.0–10.5)

## 2015-02-13 NOTE — Progress Notes (Signed)
PROGRESS NOTE  Danielle Oconnell KYH:[id number] DOB: July 29, 1935 DOA: 02/10/2015 PCP: Irven Shelling, MD  HPI: 80 y.o. Female with past medical history of irritable bowel syndrome, constipation who presented to [facility] ED with worsening abdominal pain with associated nausea and nonbloody vomiting. Patient was seen in emergency room you days ago, she had CT scan done which did not reveal acute findings and she subsequently was discharged home. Her symptoms did not improve. She came back and found to have an SBO  Subjective / 24 H Interval events - seen post op, doing well  Assessment/Plan: Principal Problem:   Partial small bowel obstruction Active Problems:   Nausea and vomiting  Partial small bowel obstruction - s/p diagnostic laparoscopy 3/25 with lysis of adhesions  - Continue supportive care with IV fluids, analgesia and antiemetics as needed. - per surgery   Nausea and vomiting, abdominal bloating  - Secondary to SBO as noted above, improving  No other medical issues.  Diet: Diet NPO time specified Except for: Ice Chips Diet - low sodium heart healthy Fluids: NS 100 cc/h DVT Prophylaxis: SCD  Code Status: Full Code Family Communication: d/w daughter bedside Disposition Plan: inpatient  Consultants:  General surgery   Procedures:  None    Antibiotics  Anti-infectives    Start     Dose/Rate Route Frequency Ordered Stop   02/12/15 0800  metroNIDAZOLE (FLAGYL) IVPB 500 mg    Comments:  Pharmacy may adjust dosing strength, interval, or rate of medication as needed for optimal therapy for the patient Send with patient on call to the OR.  Anesthesia to complete antibiotic administration <47min prior to incision per [facility].   500 mg 100 mL/hr over 60 Minutes Intravenous On call to O.R. 02/12/15 1517 02/12/15 0815   02/12/15 0715  clindamycin (CLEOCIN) 900 mg, gentamicin (GARAMYCIN) 240 mg in sodium chloride 0.9 % 1,000 mL for intraperitoneal lavage  Status:   Discontinued      Intraperitoneal To Surgery 02/12/15 0714 02/12/15 0752   02/12/15 0600  ceFAZolin (ANCEF) IVPB 2 g/50 mL premix    Comments:  Pharmacy may adjust dosing strength, interval, or rate of medication as needed for optimal therapy for the patient  Send with patient on call to the OR.  Anesthesia to complete antibiotic administration <80min prior to incision per [facility].   2 g 100 mL/hr over 30 Minutes Intravenous On call to O.R. 02/11/15 1353 02/12/15 0758       Studies  Dg Abd Portable 1v  02/12/2015   CLINICAL DATA:  Known bowel obstruction, preoperative evaluation  EXAM: PORTABLE ABDOMEN - 1 VIEW  COMPARISON:  02/10/2015  FINDINGS: A nasogastric catheter is noted but does not extend into the stomach. This has withdrawn in the interval from the prior exam. Contrast material is noted throughout the colon from the prior CT of 2 days previous. Persistent dilated small bowel is noted in the left abdomen. No free air is seen. No bony abnormality is noted.  IMPRESSION: Persistent dilated small bowel on the left. The overall appearance has improved in the interval from the prior exam. These changes are consistent with a partial small bowel obstruction. The nasogastric catheter has withdrawn into the distal esophagus.   Electronically Signed   By: Inez Catalina M.D.   On: 02/12/2015 07:57    Objective  Filed Vitals:   02/12/15 1115 02/12/15 1139 02/12/15 2125 02/13/15 0534  BP: 110/59 109/57 126/67 131/68  Pulse: 66  62 68  Temp:  97.4 F (36.3 C) 97.4 F (36.3 C) 98.2 F (36.8 C)  TempSrc:   Oral Oral  Resp: 14  18 16   Height:      Weight:      SpO2: 95%  95% 96%    Intake/Output Summary (Last 24 hours) at 02/13/15 1126 Last data filed at 02/13/15 0800  Gross per 24 hour  Intake   1728 ml  Output   1350 ml  Net    378 ml   Filed Weights   02/10/15 0824 02/11/15 1007  Weight: 82.464 kg (181 lb 12.8 oz) 81.738 kg (180 lb 3.2 oz)    Exam:  General:   NAD  HEENT: no scleral icterus, PERRL  Cardiovascular: RRR without MRG  Respiratory: CTA biL, good air movement, no wheezing, no crackles, no rales  Abdomen: soft, mild tenderness  Data Reviewed: Basic Metabolic Panel:  Recent Labs Lab 02/07/15 1804 02/10/15 0517 02/11/15 0525 02/12/15 0525 02/13/15 0544  NA 142 140 140 140 140  K 3.4* 3.5 4.2 3.6 3.9  CL 106 98 102 106 105  CO2 29  --  30 27 25   GLUCOSE 107* 122* 103* 91 88  BUN 9 11 12 12 11   CREATININE 0.78 0.90 0.80 0.76 0.65  CALCIUM 9.6  --  8.8 8.4 8.6   Liver Function Tests:  Recent Labs Lab 02/07/15 1804 02/11/15 0525 02/12/15 0525  AST 18 18 17   ALT 12 10 11   ALKPHOS 63 48 43  BILITOT 0.5 0.8 0.8  PROT 6.9 6.1 5.6*  ALBUMIN 4.3 3.7 3.1*    Recent Labs Lab 02/07/15 1815  LIPASE 29   CBC:  Recent Labs Lab 02/07/15 1804 02/10/15 0510 02/10/15 0517 02/11/15 0525 02/12/15 0525 02/13/15 0544  WBC 10.3 9.5  --  6.8 6.9 7.6  NEUTROABS 7.5 7.3  --   --   --   --   HGB 13.5 13.9 15.3* 13.2 12.0 11.9*  HCT 41.8 42.9 45.0 40.9 36.7 36.9  MCV 90.9 90.9  --  92.1 92.7 91.6  PLT 445* 448*  --  425* 383 361   CBG:  Recent Labs Lab 02/11/15 0730 02/13/15 0747  GLUCAP 103* 77   Scheduled Meds: . antiseptic oral rinse  7 mL Mouth Rinse BID  . bisacodyl  10 mg Rectal Daily  . lip balm  1 application Topical BID  . lubiprostone  24 mcg Oral BID WC   Continuous Infusions: . sodium chloride 100 mL/hr at 02/12/15 1814    Marzetta Board, MD [facility] Pager [phone]. If 7 PM - 7 AM, please contact night-coverage at [email or web address], password [facility] 02/13/2015, 11:26 AM  LOS: 3 days

## 2015-02-13 NOTE — Progress Notes (Signed)
[facility]  [address] [place], [place] [address] Phone: [phone] FAX: [phone]    Danielle Oconnell [id number] 09-Feb-1935  CARE TEAM:  PCP: Irven Shelling, MD  Outpatient Care Team: Patient Care Team: Lavone Orn, MD as PCP - General (Internal Medicine)  Inpatient Treatment Team: Treatment Team: Attending Provider: Caren Griffins, MD; Attending Physician: Robbie Lis, MD; Rounding Team: Nolon Nations, MD; Technician: Val Riles, NT; Registered Nurse: Vivi Martens, RN; Rounding Team: Ian Bushman, MD; Technician: Wilder Glade, NT; Licensed Practical Nurse: Marcina Millard, LPN; Registered Nurse: Dala Dock, RN; Registered Nurse: Revonda Standard, RN; Registered Nurse: Dory Horn, RN  Problem List:   Principal Problem:   Partial small bowel obstruction Active Problems:   Nausea and vomiting   1 Day Post-Op   PRE-OPERATIVE DIAGNOSIS: SMALL BOWEL OBSTRUCTION  POST-OPERATIVE DIAGNOSIS: SMALL BOWEL OBSTRUCTION  PROCEDURE:   DIAGNOSTIC LAPAROSCOPY WITH LYSIS OF ADHESIONS  SURGEON: Surgeon(s): Michael Boston, MD  Alphonsa Overall, MD - Asst   Assessment  Small bowel obstruction s/p lap LOA.  Some improvement by exam.  Plan:  Clamping nasogastric tube trial.  Clear liquids.  Check residuals.  If no nausea or vomiting and low residuals, remove nasogastric tube in morning and advance to full liquids.  If worsening abdominal pain or high residuals, return nasogastric tube to low intermittent wall suction and try clamping 24 hours later.  Intraoperative findings discussed with patient.  I updated the patient's status to the patient.  Recommendations were made.  Questions were answered.  The patient expressed understanding & appreciation.   VTE prophylaxis- SCDs, etc  Mobilize as tolerated to help recovery  Adin Hector, M.D., F.A.C.S. Gastrointestinal and Minimally Invasive [facility] [address], [place], [place] [address] [phone] Main / Paging   02/13/2015  Subjective:  Passed flatus 3 times yesterday.  Very small.  No result with enema.  Still high volume from nasogastric tube  Still with crampy abdominal pain.  Objective:  Vital signs:  Filed Vitals:   02/12/15 1115 02/12/15 1139 02/12/15 2125 02/13/15 0534  BP: 110/59 109/57 126/67 131/68  Pulse: 66  62 68  Temp:  97.4 F (36.3 C) 97.4 F (36.3 C) 98.2 F (36.8 C)  TempSrc:   Oral Oral  Resp: _0 Height:      Weight:      SpO2: 95%  95% 96%    Last BM Date: 02/07/15  Intake/Output   Yesterday:  03/25 0701 - 03/26 0700 In: 3948 [P.O.:180; I.V.:3408] Out: 1600 [Urine:1050; Emesis/NG output:550] This shift:  Total I/O In: 10 [Other:80] Out: -   Bowel function:  Flatus: scant  BM: no  Drain: bilious  Physical Exam:  General: Pt awake/alert/oriented x4 in no acute distress Eyes: PERRL, normal EOM.  Sclera clear.  No icterus Neuro: CN II-XII intact w/o focal sensory/motor deficits. Lymph: No head/neck/groin lymphadenopathy Psych:  No delerium/psychosis/paranoia HENT: Normocephalic, Mucus membranes moist.  No thrush Neck: Supple, No tracheal deviation Chest: No chest wall pain w good excursion CV:  Pulses intact.  Regular rhythm MS: Normal AROM mjr joints.  No obvious deformity Abdomen: A little more soft.  Moderately distended.  Mildly tender at lower abdomen.  No evidence of peritonitis.  No incarcerated hernias. Ext:  SCDs BLE.  No mjr edema.  No cyanosis Skin: No petechiae / purpura  Results:   Labs: Results for  orders placed or performed during the hospital encounter of 02/10/15 (from the past 48 hour(s))  CBC     Status: None   Collection Time: 02/12/15  5:25 AM  Result Value Ref Range   WBC 6.9 4.0 - 10.5 K/uL   RBC 3.96 3.87 - 5.11 MIL/uL   Hemoglobin 12.0 12.0 - 15.0 g/dL   HCT 36.7 36.0 - 46.0 %   MCV 92.7  78.0 - 100.0 fL   MCH 30.3 26.0 - 34.0 pg   MCHC 32.7 30.0 - 36.0 g/dL   RDW 14.0 11.5 - 15.5 %   Platelets 383 150 - 400 K/uL  Comprehensive metabolic panel     Status: Abnormal   Collection Time: 02/12/15  5:25 AM  Result Value Ref Range   Sodium 140 135 - 145 mmol/L   Potassium 3.6 3.5 - 5.1 mmol/L   Chloride 106 96 - 112 mmol/L   CO2 27 19 - 32 mmol/L   Glucose, Bld 91 70 - 99 mg/dL   BUN 12 6 - 23 mg/dL   Creatinine, Ser 0.76 0.50 - 1.10 mg/dL   Calcium 8.4 8.4 - 10.5 mg/dL   Total Protein 5.6 (L) 6.0 - 8.3 g/dL   Albumin 3.1 (L) 3.5 - 5.2 g/dL   AST 17 0 - 37 U/L   ALT 11 0 - 35 U/L   Alkaline Phosphatase 43 39 - 117 U/L   Total Bilirubin 0.8 0.3 - 1.2 mg/dL   GFR calc non Af Amer 79 (L) >90 mL/min   GFR calc Af Amer >90 >90 mL/min    Comment: (NOTE) The eGFR has been calculated using the CKD EPI equation. This calculation has not been validated in all clinical situations. eGFR's persistently <90 mL/min signify possible Chronic Kidney Disease.    Anion gap 7 5 - 15  Surgical PCR screen     Status: None   Collection Time: 02/12/15  5:27 AM  Result Value Ref Range   MRSA, PCR NEGATIVE NEGATIVE   Staphylococcus aureus NEGATIVE NEGATIVE    Comment:        The Xpert SA Assay (FDA approved for NASAL specimens in patients over 39 years of age), is one component of a comprehensive surveillance program.  Test performance has been validated by [facility] for patients greater than or equal to 41 year old. It is not intended to diagnose infection nor to guide or monitor treatment.   CBC     Status: Abnormal   Collection Time: 02/13/15  5:44 AM  Result Value Ref Range   WBC 7.6 4.0 - 10.5 K/uL   RBC 4.03 3.87 - 5.11 MIL/uL   Hemoglobin 11.9 (L) 12.0 - 15.0 g/dL   HCT 36.9 36.0 - 46.0 %   MCV 91.6 78.0 - 100.0 fL   MCH 29.5 26.0 - 34.0 pg   MCHC 32.2 30.0 - 36.0 g/dL   RDW 13.7 11.5 - 15.5 %   Platelets 361 150 - 400 K/uL  Basic metabolic panel     Status:  Abnormal   Collection Time: 02/13/15  5:44 AM  Result Value Ref Range   Sodium 140 135 - 145 mmol/L   Potassium 3.9 3.5 - 5.1 mmol/L   Chloride 105 96 - 112 mmol/L   CO2 25 19 - 32 mmol/L   Glucose, Bld 88 70 - 99 mg/dL   BUN 11 6 - 23 mg/dL   Creatinine, Ser 0.65 0.50 - 1.10 mg/dL   Calcium 8.6 8.4 - 10.5  mg/dL   GFR calc non Af Amer 83 (L) >90 mL/min   GFR calc Af Amer >90 >90 mL/min    Comment: (NOTE) The eGFR has been calculated using the CKD EPI equation. This calculation has not been validated in all clinical situations. eGFR's persistently <90 mL/min signify possible Chronic Kidney Disease.    Anion gap 10 5 - 15  Glucose, capillary     Status: None   Collection Time: 02/13/15  7:47 AM  Result Value Ref Range   Glucose-Capillary 77 70 - 99 mg/dL   Comment 1 Notify RN     Imaging / Studies: Dg Abd Portable 1v  02/12/2015   CLINICAL DATA:  Known bowel obstruction, preoperative evaluation  EXAM: PORTABLE ABDOMEN - 1 VIEW  COMPARISON:  02/10/2015  FINDINGS: A nasogastric catheter is noted but does not extend into the stomach. This has withdrawn in the interval from the prior exam. Contrast material is noted throughout the colon from the prior CT of 2 days previous. Persistent dilated small bowel is noted in the left abdomen. No free air is seen. No bony abnormality is noted.  IMPRESSION: Persistent dilated small bowel on the left. The overall appearance has improved in the interval from the prior exam. These changes are consistent with a partial small bowel obstruction. The nasogastric catheter has withdrawn into the distal esophagus.   Electronically Signed   By: Inez Catalina M.D.   On: 02/12/2015 07:57    Medications / Allergies: per chart  Antibiotics: Anti-infectives    Start     Dose/Rate Route Frequency Ordered Stop   02/12/15 0800  metroNIDAZOLE (FLAGYL) IVPB 500 mg    Comments:  Pharmacy may adjust dosing strength, interval, or rate of medication as needed for optimal  therapy for the patient Send with patient on call to the OR.  Anesthesia to complete antibiotic administration <60mn [facility]   500 mg 100 mL/hr over 60 Minutes Intravenous On call to O.R. 02/12/15 0579003/25/16 0815   02/12/15 0715  clindamycin (CLEOCIN) 900 mg, gentamicin (GARAMYCIN) 240 mg in sodium chloride 0.9 % 1,000 mL for intraperitoneal lavage  Status:  Discontinued      Intraperitoneal To Surgery 02/12/15 0714 02/12/15 0752   02/12/15 0600  ceFAZolin (ANCEF) IVPB 2 g/50 mL premix    Comments:  Pharmacy may adjust dosing strength, interval, or rate of medication as needed for optimal therapy for the patient  Send with patient on call to the OR.  Anesthesia to complete antibiotic administration <653m [facility]  2 g 100 mL/hr over 30 Minutes Intravenous On call to O.R. 02/11/15 1353 02/12/15 0758       Note: Portions of this report may have been transcribed using voice recognition software. Every effort was made to ensure accuracy; however, inadvertent computerized transcription errors may be present.   Any transcriptional errors that result from this process are unintentional.     StAdin HectorM.D., F.A.C.S. Gastrointestinal and Minimally Invasive Surgery [facility] [address] [phone] / Paging   02/13/2015

## 2015-02-14 LAB — CBC
HCT: 36.1 % (ref 36.0–46.0)
Hemoglobin: 12 g/dL (ref 12.0–15.0)
MCH: 30.5 pg (ref 26.0–34.0)
MCHC: 33.2 g/dL (ref 30.0–36.0)
MCV: 91.9 fL (ref 78.0–100.0)
Platelets: 340 10*3/uL (ref 150–400)
RBC: 3.93 MIL/uL (ref 3.87–5.11)
RDW: 13.8 % (ref 11.5–15.5)
WBC: 6.4 10*3/uL (ref 4.0–10.5)

## 2015-02-14 LAB — BASIC METABOLIC PANEL
Anion gap: 6 (ref 5–15)
BUN: 8 mg/dL (ref 6–23)
CO2: 28 mmol/L (ref 19–32)
Calcium: 8.2 mg/dL — ABNORMAL LOW (ref 8.4–10.5)
Chloride: 106 mmol/L (ref 96–112)
Creatinine, Ser: 0.7 mg/dL (ref 0.50–1.10)
GFR calc Af Amer: >90 mL/min (ref >90)
GFR calc non Af Amer: 81 mL/min — ABNORMAL LOW (ref >90)
Glucose, Bld: 94 mg/dL (ref 70–99)
Potassium: 3.5 mmol/L (ref 3.5–5.1)
Sodium: 140 mmol/L (ref 135–145)

## 2015-02-14 LAB — GLUCOSE, CAPILLARY: Glucose-Capillary: 88 mg/dL (ref 70–99)

## 2015-02-14 MED ORDER — SODIUM CHLORIDE 0.9 % IV SOLN: 250.0000 mL | INTRAVENOUS | Status: DC | PRN

## 2015-02-14 MED ORDER — HYDROMORPHONE HCL 1 MG/ML IJ SOLN: 0.5000 mg | INTRAMUSCULAR | Status: DC | PRN

## 2015-02-14 MED ORDER — SODIUM CHLORIDE 0.9 % IJ SOLN
3.0000 mL | Freq: Two times a day (BID) | INTRAMUSCULAR | Status: DC
Start: 2015-02-14 — End: 2015-02-16
  Administered 2015-02-14 – 2015-02-15 (×2): 3 mL via INTRAVENOUS

## 2015-02-14 MED ORDER — ALIGN 4 MG PO CAPS
4.0000 mg | ORAL_CAPSULE | Freq: Every day | ORAL | Status: DC
  Administered 2015-02-14 – 2015-02-16 (×3): 1 via ORAL
  Filled 2015-02-14 (×3): qty 1

## 2015-02-14 MED ORDER — LACTATED RINGERS IV BOLUS (SEPSIS): 1000.0000 mL | Freq: Three times a day (TID) | INTRAVENOUS | Status: AC | PRN

## 2015-02-14 MED ORDER — ACETAMINOPHEN 325 MG PO TABS
325.0000 mg | ORAL_TABLET | Freq: Four times a day (QID) | ORAL | Status: DC | PRN
Start: 2015-02-14 — End: 2015-02-16

## 2015-02-14 MED ORDER — TRAMADOL HCL 50 MG PO TABS: 50.0000 mg | ORAL_TABLET | Freq: Four times a day (QID) | ORAL | Status: DC | PRN

## 2015-02-14 MED ORDER — POLYETHYL GLYCOL-PROPYL GLYCOL 0.4-0.3 % OP SOLN: 1.0000 [drp] | Freq: Three times a day (TID) | OPHTHALMIC | Status: DC | PRN

## 2015-02-14 MED ORDER — SODIUM CHLORIDE 0.9 % IJ SOLN: 3.0000 mL | INTRAMUSCULAR | Status: DC | PRN

## 2015-02-14 MED ORDER — POLYVINYL ALCOHOL 1.4 % OP SOLN
1.0000 [drp] | OPHTHALMIC | Status: DC | PRN
  Filled 2015-02-14: qty 15

## 2015-02-14 MED ORDER — GUAIFENESIN ER 600 MG PO TB12
600.0000 mg | ORAL_TABLET | Freq: Every day | ORAL | Status: DC
  Filled 2015-02-14 (×3): qty 1

## 2015-02-14 MED ORDER — AZELASTINE-FLUTICASONE 137-50 MCG/ACT NA SUSP: 1.0000 | Freq: Every day | NASAL | Status: DC | PRN

## 2015-02-14 NOTE — Progress Notes (Signed)
PROGRESS NOTE  Danielle Oconnell QQI:[id number] DOB: 1935-05-14 DOA: 02/10/2015 PCP: Irven Shelling, MD  HPI: 80 y.o. Female with past medical history of irritable bowel syndrome, constipation who presented to [facility] ED with worsening abdominal pain with associated nausea and nonbloody vomiting. Patient was seen in emergency room you days ago, she had CT scan done which did not reveal acute findings and she subsequently was discharged home. Her symptoms did not improve. She came back and found to have an SBO  Subjective / 24 H Interval events - less abdominal pain, feeling better  Assessment/Plan: Principal Problem:   Partial small bowel obstruction Active Problems:   Nausea and vomiting  Partial small bowel obstruction - s/p diagnostic laparoscopy 3/25 with lysis of adhesions  - Continue supportive care with IV fluids, analgesia and antiemetics as needed. - per surgery   Nausea and vomiting, abdominal bloating  - Secondary to SBO as noted above, improving  No other medical issues.  Diet: Diet - low sodium heart healthy Diet full liquid Room service appropriate?: Yes; Fluid consistency:: Thin Fluids: NS 100 cc/h DVT Prophylaxis: SCD  Code Status: Full Code Family Communication: d/w daughter bedside Disposition Plan: inpatient  Consultants:  General surgery   Procedures:  None    Antibiotics  Anti-infectives    Start     Dose/Rate Route Frequency Ordered Stop   02/12/15 0800  metroNIDAZOLE (FLAGYL) IVPB 500 mg    Comments:  Pharmacy may adjust dosing strength, interval, or rate of medication as needed for optimal therapy for the patient Send with patient on call to the OR.  Anesthesia to complete antibiotic administration <48min prior to incision per [facility].   500 mg 100 mL/hr over 60 Minutes Intravenous On call to O.R. 02/12/15 9417 02/12/15 0815   02/12/15 0715  clindamycin (CLEOCIN) 900 mg, gentamicin (GARAMYCIN) 240 mg in sodium chloride 0.9 % 1,000 mL  for intraperitoneal lavage  Status:  Discontinued      Intraperitoneal To Surgery 02/12/15 0714 02/12/15 0752   02/12/15 0600  ceFAZolin (ANCEF) IVPB 2 g/50 mL premix    Comments:  Pharmacy may adjust dosing strength, interval, or rate of medication as needed for optimal therapy for the patient  Send with patient on call to the OR.  Anesthesia to complete antibiotic administration <64min prior to incision per [facility]   2 g 100 mL/hr over 30 Minutes Intravenous On call to O.R. 02/11/15 1353 02/12/15 0758       Studies  No results found.  Objective  Filed Vitals:   02/13/15 1020 02/13/15 1445 02/13/15 2100 02/14/15 0549  BP:  120/69 129/74 131/73  Pulse:  88 84 78  Temp:  98.1 F (36.7 C) 98.2 F (36.8 C) 98.5 F (36.9 C)  TempSrc:  Oral Oral Oral  Resp:  18 18 18   Height:      Weight: 84.777 kg (186 lb 14.4 oz)  84.913 kg (187 lb 3.2 oz)   SpO2:  93% 91% 91%    Intake/Output Summary (Last 24 hours) at 02/14/15 1252 Last data filed at 02/14/15 1100  Gross per 24 hour  Intake   1080 ml  Output   2375 ml  Net  -1295 ml   Filed Weights   02/11/15 1007 02/13/15 1020 02/13/15 2100  Weight: 81.738 kg (180 lb 3.2 oz) 84.777 kg (186 lb 14.4 oz) 84.913 kg (187 lb 3.2 oz)    Exam:  General:  NAD  HEENT: no scleral icterus, PERRL  Cardiovascular:  RRR without MRG  Respiratory: CTA biL, good air movement, no wheezing, no crackles, no rales  Data Reviewed: Basic Metabolic Panel:  Recent Labs Lab 02/07/15 1804 02/10/15 0517 02/11/15 0525 02/12/15 0525 02/13/15 0544 02/14/15 0508  NA 142 140 140 140 140 140  K 3.4* 3.5 4.2 3.6 3.9 3.5  CL 106 98 102 106 105 106  CO2 29  --  30 27 25 28   GLUCOSE 107* 122* 103* 91 88 94  BUN 9 11 12 12 11 8   CREATININE 0.78 0.90 0.80 0.76 0.65 0.70  CALCIUM 9.6  --  8.8 8.4 8.6 8.2*   Liver Function Tests:  Recent Labs Lab 02/07/15 1804 02/11/15 0525 02/12/15 0525  AST 18 18 17   ALT 12 10 11   ALKPHOS 63 48 43    BILITOT 0.5 0.8 0.8  PROT 6.9 6.1 5.6*  ALBUMIN 4.3 3.7 3.1*    Recent Labs Lab 02/07/15 1815  LIPASE 29   CBC:  Recent Labs Lab 02/07/15 1804 02/10/15 0510 02/10/15 0517 02/11/15 0525 02/12/15 0525 02/13/15 0544 02/14/15 0508  WBC 10.3 9.5  --  6.8 6.9 7.6 6.4  NEUTROABS 7.5 7.3  --   --   --   --   --   HGB 13.5 13.9 15.3* 13.2 12.0 11.9* 12.0  HCT 41.8 42.9 45.0 40.9 36.7 36.9 36.1  MCV 90.9 90.9  --  92.1 92.7 91.6 91.9  PLT 445* 448*  --  425* 383 361 340   CBG:  Recent Labs Lab 02/11/15 0730 02/13/15 0747 02/14/15 0739  GLUCAP 103* 77 88   Scheduled Meds: . ALIGN  4 mg Oral Daily  . antiseptic oral rinse  7 mL Mouth Rinse BID  . bisacodyl  10 mg Rectal Daily  . guaiFENesin  600 mg Oral Daily  . lip balm  1 application Topical BID  . sodium chloride  3 mL Intravenous Q12H   Continuous Infusions:    Marzetta Board, MD [facility] Pager [phone]. If 7 PM - 7 AM, please contact night-coverage at [email or web address], password [facility] 02/14/2015, 12:52 PM  LOS: 4 days

## 2015-02-14 NOTE — Progress Notes (Signed)
[facility]  [address] [place], [place] [address] Phone: [phone] FAX: [place] [phone] 06-Jul-1935  CARE TEAM:  PCP: Irven Shelling, MD  Outpatient Care Team: Patient Care Team: Lavone Orn, MD as PCP - General (Internal Medicine)  Inpatient Treatment Team: Treatment Team: Attending Provider: Caren Griffins, MD; Attending Physician: Robbie Lis, MD; Rounding Team: Nolon Nations, MD; Technician: Val Riles, NT; Registered Nurse: Vivi Martens, RN; Rounding Team: Ian Bushman, MD; Technician: Wilder Glade, NT; Licensed Practical Nurse: Marcina Millard, LPN; Registered Nurse: Dala Dock, RN; Registered Nurse: Revonda Standard, RN; Registered Nurse: Milta Deiters, RN  Problem List:   Principal Problem:   Partial small bowel obstruction Active Problems:   Nausea and vomiting   2 Days Post-Op   PRE-OPERATIVE DIAGNOSIS: SMALL BOWEL OBSTRUCTION  POST-OPERATIVE DIAGNOSIS: SMALL BOWEL OBSTRUCTION  PROCEDURE:   DIAGNOSTIC LAPAROSCOPY WITH LYSIS OF ADHESIONS  SURGEON: Surgeon(s): Michael Boston, MD  Alphonsa Overall, MD - Asst   Assessment  Small bowel obstruction s/p lap LOA.  Resolving gradually.  Plan:  Tolerated Clamping nasogastric tube trial.  I d/c'd NGT.  Adv to full liquids.   If nausea or vomiting, dec to sips only  PO meds as tolerated  VTE prophylaxis- SCDs, etc  Mobilize as tolerated to help recovery  D/C patient from hospital when patient meets criteria (anticipate in 1-2 day(s)):  Tolerating oral intake well Ambulating in walkways Adequate pain control without IV medications Urinating  Having flatus   I updated the patient's status to the patient.  Recommendations were made.  Questions were answered.  The patient expressed understanding & appreciation.  Adin Hector, M.D., F.A.C.S. Gastrointestinal and Minimally Invasive Surgery [facility] [address] [place], [place] [address] [phone] Main / Paging   02/14/2015  Subjective:  Passing flatus Bathing herself, walking around room Some abd cramping last night but milder  Objective:  Vital signs:  Filed Vitals:   02/13/15 1020 02/13/15 1445 02/13/15 2100 02/14/15 0549  BP:  120/69 129/74 131/73  Pulse:  88 84 78  Temp:  98.1 F (36.7 C) 98.2 F (36.8 C) 98.5 F (36.9 C)  TempSrc:  Oral Oral Oral  Resp:  18 18 18   Height:      Weight: 84.777 kg (186 lb 14.4 oz)  84.913 kg (187 lb 3.2 oz)   SpO2:  93% 91% 91%    Last BM Date: 02/07/15  Intake/Output   Yesterday:  03/26 0701 - 03/27 0700 In: 680 [P.O.:600] Out: 2025 [Urine:2025] This shift:     Bowel function:  Flatus: yes  BM: no  Drain: thin watery bilious  Physical Exam:  General: Pt awake/alert/oriented x4 in no acute distress Eyes: PERRL, normal EOM.  Sclera clear.  No icterus Neuro: CN II-XII intact w/o focal sensory/motor deficits. Lymph: No head/neck/groin lymphadenopathy Psych:  No delerium/psychosis/paranoia HENT: Normocephalic, Mucus membranes moist.  No thrush Neck: Supple, No tracheal deviation Chest: No chest wall pain w good excursion CV:  Pulses intact.  Regular rhythm MS: Normal AROM mjr joints.  No obvious deformity Abdomen: Mostly soft.  Mildly distended.  Minimally tender at lower abdomen.  No evidence of peritonitis.  No incarcerated hernias. Ext:  SCDs BLE.  No mjr edema.  No cyanosis Skin: No petechiae / purpura  Results:   Labs: Results for orders placed or performed during the hospital encounter of 02/10/15 (  from the past 48 hour(s))  CBC     Status: Abnormal   Collection Time: 02/13/15  5:44 AM  Result Value Ref Range   WBC 7.6 4.0 - 10.5 K/uL   RBC 4.03 3.87 - 5.11 MIL/uL   Hemoglobin 11.9 (L) 12.0 - 15.0 g/dL   HCT 36.9 36.0 - 46.0 %   MCV 91.6 78.0 - 100.0 fL   MCH 29.5 26.0 - 34.0 pg   MCHC 32.2 30.0 - 36.0 g/dL    RDW 13.7 11.5 - 15.5 %   Platelets 361 150 - 400 K/uL  Basic metabolic panel     Status: Abnormal   Collection Time: 02/13/15  5:44 AM  Result Value Ref Range   Sodium 140 135 - 145 mmol/L   Potassium 3.9 3.5 - 5.1 mmol/L   Chloride 105 96 - 112 mmol/L   CO2 25 19 - 32 mmol/L   Glucose, Bld 88 70 - 99 mg/dL   BUN 11 6 - 23 mg/dL   Creatinine, Ser 0.65 0.50 - 1.10 mg/dL   Calcium 8.6 8.4 - 10.5 mg/dL   GFR calc non Af Amer 83 (L) >90 mL/min   GFR calc Af Amer >90 >90 mL/min    Comment: (NOTE) The eGFR has been calculated using the CKD EPI equation. This calculation has not been validated in all clinical situations. eGFR's persistently <90 mL/min signify possible Chronic Kidney Disease.    Anion gap 10 5 - 15  Glucose, capillary     Status: None   Collection Time: 02/13/15  7:47 AM  Result Value Ref Range   Glucose-Capillary 77 70 - 99 mg/dL   Comment 1 Notify RN   CBC     Status: None   Collection Time: 02/14/15  5:08 AM  Result Value Ref Range   WBC 6.4 4.0 - 10.5 K/uL   RBC 3.93 3.87 - 5.11 MIL/uL   Hemoglobin 12.0 12.0 - 15.0 g/dL   HCT 36.1 36.0 - 46.0 %   MCV 91.9 78.0 - 100.0 fL   MCH 30.5 26.0 - 34.0 pg   MCHC 33.2 30.0 - 36.0 g/dL   RDW 13.8 11.5 - 15.5 %   Platelets 340 150 - 400 K/uL  Basic metabolic panel     Status: Abnormal   Collection Time: 02/14/15  5:08 AM  Result Value Ref Range   Sodium 140 135 - 145 mmol/L   Potassium 3.5 3.5 - 5.1 mmol/L   Chloride 106 96 - 112 mmol/L   CO2 28 19 - 32 mmol/L   Glucose, Bld 94 70 - 99 mg/dL   BUN 8 6 - 23 mg/dL   Creatinine, Ser 0.70 0.50 - 1.10 mg/dL   Calcium 8.2 (L) 8.4 - 10.5 mg/dL   GFR calc non Af Amer 81 (L) >90 mL/min   GFR calc Af Amer >90 >90 mL/min    Comment: (NOTE) The eGFR has been calculated using the CKD EPI equation. This calculation has not been validated in all clinical situations. eGFR's persistently <90 mL/min signify possible Chronic Kidney Disease.    Anion gap 6 5 - 15  Glucose,  capillary     Status: None   Collection Time: 02/14/15  7:39 AM  Result Value Ref Range   Glucose-Capillary 88 70 - 99 mg/dL   Comment 1 Notify RN     Imaging / Studies: No results found.  Medications / Allergies: per chart  Antibiotics: Anti-infectives    Start     Dose/Rate  Route Frequency Ordered Stop   02/12/15 0800  metroNIDAZOLE (FLAGYL) IVPB 500 mg    Comments:  Pharmacy may adjust dosing strength, interval, or rate of medication as needed for optimal therapy for the patient Send with patient on call to the OR.  Anesthesia to complete antibiotic administration <101mn [facility]   500 mg 100 mL/hr over 60 Minutes Intravenous On call to O.R. 02/12/15 0223003/25/16 0815   02/12/15 0715  clindamycin (CLEOCIN) 900 mg, gentamicin (GARAMYCIN) 240 mg in sodium chloride 0.9 % 1,000 mL for intraperitoneal lavage  Status:  Discontinued      Intraperitoneal To Surgery 02/12/15 0714 02/12/15 0752   02/12/15 0600  ceFAZolin (ANCEF) IVPB 2 g/50 mL premix    Comments:  Pharmacy may adjust dosing strength, interval, or rate of medication as needed for optimal therapy for the patient  Send with patient on call to the OR.  Anesthesia to complete antibiotic administration <628m [facility]  2 g 100 mL/hr over 30 Minutes Intravenous On call to O.R. 02/11/15 1353 02/12/15 0758       Note: Portions of this report may have been transcribed using voice recognition software. Every effort was made to ensure accuracy; however, inadvertent computerized transcription errors may be present.   Any transcriptional errors that result from this process are unintentional.     StAdin HectorM.D., F.A.C.S. Gastrointestinal and Minimally Invasive Surgery [facility] [address] [phone] / Paging   02/14/2015

## 2015-02-15 ENCOUNTER — Encounter (HOSPITAL_COMMUNITY): Payer: Self-pay | Admitting: Surgery

## 2015-02-15 LAB — GLUCOSE, CAPILLARY: Glucose-Capillary: 83 mg/dL (ref 70–99)

## 2015-02-15 MED ORDER — POLYETHYLENE GLYCOL 3350 17 G PO PACK
17.0000 g | PACK | Freq: Every day | ORAL | Status: DC
  Administered 2015-02-15 – 2015-02-16 (×2): 17 g via ORAL
  Filled 2015-02-15 (×2): qty 1

## 2015-02-15 NOTE — Progress Notes (Signed)
PROGRESS NOTE  TAURUS WILLIS DJT:[id number] DOB: Oct 04, 1935 DOA: 02/10/2015 PCP: Irven Shelling, MD  HPI: 80 y.o. Female with past medical history of irritable bowel syndrome, constipation who presented to [facility] ED with worsening abdominal pain with associated nausea and nonbloody vomiting. Patient was seen in emergency room you days ago, she had CT scan done which did not reveal acute findings and she subsequently was discharged home. Her symptoms did not improve. She came back and found to have an SBO  Subjective / 24 H Interval events - less abdominal pain, feeling better, eating soft diet today. No BM yet  Assessment/Plan: Principal Problem:   Partial small bowel obstruction Active Problems:   Nausea and vomiting  Partial small bowel obstruction - s/p diagnostic laparoscopy 3/25 with lysis of adhesions  - Continue supportive care with IV fluids, analgesia and antiemetics as needed. - per surgery   Nausea and vomiting, abdominal bloating  - Secondary to SBO as noted above, improving  No other medical issues.  Diet: Diet - low sodium heart healthy DIET SOFT Room service appropriate?: Yes; Fluid consistency:: Thin Fluids: NS 100 cc/h DVT Prophylaxis: SCD  Code Status: Full Code Family Communication: d/w daughter bedside Disposition Plan: inpatient  Consultants:  General surgery   Procedures:  None    Antibiotics  Anti-infectives    Start     Dose/Rate Route Frequency Ordered Stop   02/12/15 0800  metroNIDAZOLE (FLAGYL) IVPB 500 mg    Comments:  Pharmacy may adjust dosing strength, interval, or rate of medication as needed for optimal therapy for the patient Send with patient on call to the OR.  Anesthesia to complete antibiotic administration <50min prior to incision per [facility].   500 mg 100 mL/hr over 60 Minutes Intravenous On call to O.R. 02/12/15 3009 02/12/15 0815   02/12/15 0715  clindamycin (CLEOCIN) 900 mg, gentamicin (GARAMYCIN) 240 mg in  sodium chloride 0.9 % 1,000 mL for intraperitoneal lavage  Status:  Discontinued      Intraperitoneal To Surgery 02/12/15 0714 02/12/15 0752   02/12/15 0600  ceFAZolin (ANCEF) IVPB 2 g/50 mL premix    Comments:  Pharmacy may adjust dosing strength, interval, or rate of medication as needed for optimal therapy for the patient  Send with patient on call to the OR.  Anesthesia to complete antibiotic administration <8min prior to incision per [facility].   2 g 100 mL/hr over 30 Minutes Intravenous On call to O.R. 02/11/15 1353 02/12/15 0758       Studies  No results found.  Objective  Filed Vitals:   02/14/15 1451 02/14/15 2124 02/15/15 0543 02/15/15 1257  BP: 145/65 142/72 119/65 147/81  Pulse: 93 88 83 81  Temp: 98 F (36.7 C) 98.5 F (36.9 C) 98.5 F (36.9 C) 98.2 F (36.8 C)  TempSrc:  Oral Oral Oral  Resp: 16 20 18 20   Height:      Weight:      SpO2: 98% 94% 95% 98%    Intake/Output Summary (Last 24 hours) at 02/15/15 1846 Last data filed at 02/15/15 1700  Gross per 24 hour  Intake    240 ml  Output   2575 ml  Net  -2335 ml   Filed Weights   02/11/15 1007 02/13/15 1020 02/13/15 2100  Weight: 81.738 kg (180 lb 3.2 oz) 84.777 kg (186 lb 14.4 oz) 84.913 kg (187 lb 3.2 oz)    Exam:  General:  NAD  HEENT: no scleral icterus, PERRL  Cardiovascular:  RRR without MRG  Respiratory: CTA biL, good air movement, no wheezing, no crackles, no rales  Data Reviewed: Basic Metabolic Panel:  Recent Labs Lab 02/10/15 0517 02/11/15 0525 02/12/15 0525 02/13/15 0544 02/14/15 0508  NA 140 140 140 140 140  K 3.5 4.2 3.6 3.9 3.5  CL 98 102 106 105 106  CO2  --  30 27 25 28   GLUCOSE 122* 103* 91 88 94  BUN 11 12 12 11 8   CREATININE 0.90 0.80 0.76 0.65 0.70  CALCIUM  --  8.8 8.4 8.6 8.2*   Liver Function Tests:  Recent Labs Lab 02/11/15 0525 02/12/15 0525  AST 18 17  ALT 10 11  ALKPHOS 48 43  BILITOT 0.8 0.8  PROT 6.1 5.6*  ALBUMIN 3.7 3.1*   No  results for input(s): LIPASE, AMYLASE in the last 168 hours. CBC:  Recent Labs Lab 02/10/15 0510 02/10/15 0517 02/11/15 0525 02/12/15 0525 02/13/15 0544 02/14/15 0508  WBC 9.5  --  6.8 6.9 7.6 6.4  NEUTROABS 7.3  --   --   --   --   --   HGB 13.9 15.3* 13.2 12.0 11.9* 12.0  HCT 42.9 45.0 40.9 36.7 36.9 36.1  MCV 90.9  --  92.1 92.7 91.6 91.9  PLT 448*  --  425* 383 361 340   CBG:  Recent Labs Lab 02/11/15 0730 02/13/15 0747 02/14/15 0739 02/15/15 0728  GLUCAP 103* 77 88 83   Scheduled Meds: . ALIGN  4 mg Oral Daily  . antiseptic oral rinse  7 mL Mouth Rinse BID  . bisacodyl  10 mg Rectal Daily  . guaiFENesin  600 mg Oral Daily  . lip balm  1 application Topical BID  . polyethylene glycol  17 g Oral Daily  . sodium chloride  3 mL Intravenous Q12H   Continuous Infusions:    Marzetta Board, MD [facility] Pager [phone]. If 7 PM - 7 AM, please contact night-coverage at [email or web address], password [facility] 02/15/2015, 6:46 PM  LOS: 5 days

## 2015-02-15 NOTE — Progress Notes (Addendum)
Patient ID: JEANENNE LICEA, female   DOB: 1935-09-18, 80 y.o.   MRN: [id number]     [address] [place], [place] [address]    Phone: [phone] FAX: [phone]     Subjective: Passing flatus.  No BM yet.  Has chronic constipation.  VSS.  Afebrile.  Denies n/v.    Objective:  Vital signs:  Filed Vitals:   02/14/15 0549 02/14/15 1451 02/14/15 2124 02/15/15 0543  BP: 131/73 145/65 142/72 119/65  Pulse: 78 93 88 83  Temp: 98.5 F (36.9 C) 98 F (36.7 C) 98.5 F (36.9 C) 98.5 F (36.9 C)  TempSrc: Oral  Oral Oral  Resp: 18 16 20 18   Height:      Weight:      SpO2: 91% 98% 94% 95%    Last BM Date: 02/12/15  Intake/Output   Yesterday:  03/27 0701 - 03/28 0700 In: 720 [P.O.:720] Out: 3200 [Urine:3200] This shift: I/O last 3 completed shifts: In: 64 [P.O.:720] Out: 4075 [Urine:4075]    Physical Exam: General: Pt awake/alert/oriented x4 in no acute distress Abdomen: Soft.  Nondistended.   Mildly tender at incisions only.  Dressings removed, incisions are clean and without erythema. No evidence of peritonitis.  No incarcerated hernias.    Problem List:   Principal Problem:   Partial small bowel obstruction Active Problems:   Nausea and vomiting    Results:   Labs: Results for orders placed or performed during the hospital encounter of 02/10/15 (from the past 48 hour(s))  CBC     Status: None   Collection Time: 02/14/15  5:08 AM  Result Value Ref Range   WBC 6.4 4.0 - 10.5 K/uL   RBC 3.93 3.87 - 5.11 MIL/uL   Hemoglobin 12.0 12.0 - 15.0 g/dL   HCT 36.1 36.0 - 46.0 %   MCV 91.9 78.0 - 100.0 fL   MCH 30.5 26.0 - 34.0 pg   MCHC 33.2 30.0 - 36.0 g/dL   RDW 13.8 11.5 - 15.5 %   Platelets 340 150 - 400 K/uL  Basic metabolic panel     Status: Abnormal   Collection Time: 02/14/15  5:08 AM  Result Value Ref Range   Sodium 140 135 - 145 mmol/L   Potassium 3.5 3.5 - 5.1 mmol/L   Chloride  106 96 - 112 mmol/L   CO2 28 19 - 32 mmol/L   Glucose, Bld 94 70 - 99 mg/dL   BUN 8 6 - 23 mg/dL   Creatinine, Ser 0.70 0.50 - 1.10 mg/dL   Calcium 8.2 (L) 8.4 - 10.5 mg/dL   GFR calc non Af Amer 81 (L) >90 mL/min   GFR calc Af Amer >90 >90 mL/min    Comment: (NOTE) The eGFR has been calculated using the CKD EPI equation. This calculation has not been validated in all clinical situations. eGFR's persistently <90 mL/min signify possible Chronic Kidney Disease.    Anion gap 6 5 - 15  Glucose, capillary     Status: None   Collection Time: 02/14/15  7:39 AM  Result Value Ref Range   Glucose-Capillary 88 70 - 99 mg/dL   Comment 1 Notify RN   Glucose, capillary     Status: None   Collection Time: 02/15/15  7:28 AM  Result Value Ref Range   Glucose-Capillary 83 70 - 99 mg/dL   Comment 1 Notify RN     Imaging / Studies:  No results found.  Medications / Allergies:  Scheduled Meds: . ALIGN  4 mg Oral Daily  . antiseptic oral rinse  7 mL Mouth Rinse BID  . bisacodyl  10 mg Rectal Daily  . guaiFENesin  600 mg Oral Daily  . lip balm  1 application Topical BID  . polyethylene glycol  17 g Oral Daily  . sodium chloride  3 mL Intravenous Q12H   Continuous Infusions:  PRN Meds:.sodium chloride, [DISCONTINUED] acetaminophen **OR** acetaminophen, acetaminophen, alum & mag hydroxide-simeth, Azelastine-Fluticasone, diphenhydrAMINE, HYDROmorphone (DILAUDID) injection, lactated ringers, magic mouthwash, menthol-cetylpyridinium, metoCLOPramide (REGLAN) injection, ondansetron **OR** ondansetron (ZOFRAN) IV, phenol, polyvinyl alcohol, promethazine, sodium chloride, traMADol  Antibiotics: Anti-infectives    Start     Dose/Rate Route Frequency Ordered Stop   02/12/15 0800  metroNIDAZOLE (FLAGYL) IVPB 500 mg    Comments:  Pharmacy may adjust dosing strength, interval, or rate of medication as needed for optimal therapy for the patient Send with patient on call to the OR.  Anesthesia to complete  [facility]iotic [facility]23mn [facility]   500 mg 100 mL/hr over 60 Minutes Intravenous On call to O.R. 02/12/15 0174903/25/16 0815   02/12/15 0715  clindamycin (CLEOCIN) 900 mg, gentamicin (GARAMYCIN) 240 mg in sodium chloride 0.9 % 1,000 mL for intraperitoneal lavage  Status:  Discontinued      Intraperitoneal To Surgery 02/12/15 0714 02/12/15 0752   02/12/15 0600  ceFAZolin (ANCEF) IVPB 2 g/50 mL premix    Comments:  Pharmacy may adjust dosing strength, interval, or rate of medication as needed for optimal therapy for the patient  Send with patient on call to the OR.  Anesthesia to complete antibiotic administration <688m [facility]  2 g 100 mL/hr over 30 Minutes Intravenous On call to O.R. 02/11/15 1353 02/12/15 0758        Assessment/Plan POD#3 Diagnostic laparoscopy with LOA---Dr. GrJohney Mainestart miralax -advance to a soft diet -will discuss with Dr. ThMarcello Mooreshen okay to resume Amitiza -SCD -mobilize -pain control--tylenol and tramadol -possible DC later today, but would like for her to have a BM and tolerate solids.    EmErby PianANLancaster Specialty Surgery Centerurgery Pager [phone](7A-4:30P)   02/15/2015 8:56 AM   Addendum: Agree with above.  Pt having bowel function and tolerating a diet.  [place].    AlRosario AdieMD  [facility]ur[facility]

## 2015-02-16 LAB — GLUCOSE, CAPILLARY: Glucose-Capillary: 88 mg/dL (ref 70–99)

## 2015-02-16 MED ORDER — HYDROCODONE-ACETAMINOPHEN 5-325 MG PO TABS: 1.0000 | ORAL_TABLET | Freq: Four times a day (QID) | ORAL | Status: DC | PRN

## 2015-02-16 NOTE — Discharge Instructions (Signed)
LAPAROSCOPIC SURGERY: POST OP INSTRUCTIONS ° °1. DIET: Follow a light bland diet the first 24 hours after arrival home, such as soup, liquids, crackers, etc.  Be sure to include lots of fluids daily.  Avoid fast food or heavy meals as your are more likely to get nauseated.  Eat a low fat the next few days after surgery.   °2. Take your usually prescribed home medications unless otherwise directed. °3. PAIN CONTROL: °a. Pain is best controlled by a usual combination of three different methods TOGETHER: °i. Ice/Heat °ii. Over the counter pain medication °iii. Prescription pain medication °b. Most patients will experience some swelling and bruising around the incisions.  Ice packs or heating pads (30-60 minutes up to 6 times a day) will help. Use ice for the first few days to help decrease swelling and bruising, then switch to heat to help relax tight/sore spots and speed recovery.  Some people prefer to use ice alone, heat alone, alternating between ice & heat.  Experiment to what works for you.  Swelling and bruising can take several weeks to resolve.   °c. It is helpful to take an over-the-counter pain medication regularly for the first few weeks.  Choose one of the following that works best for you: °i. Naproxen (Aleve, etc)  Two 220mg tabs twice a day °ii. Ibuprofen (Advil, etc) Three 200mg tabs four times a day (every meal & bedtime) °iii. Acetaminophen (Tylenol, etc) 500-650mg four times a day (every meal & bedtime) °d. A  prescription for pain medication (such as oxycodone, hydrocodone, etc) should be given to you upon discharge.  Take your pain medication as prescribed.  °i. If you are having problems/concerns with the prescription medicine (does not control pain, nausea, vomiting, rash, itching, etc), please call us [phone] to see if we need to switch you to a different pain medicine that will work better for you and/or control your side effect better. °ii. If you need a refill on your pain medication,  please contact your pharmacy.  They will contact our office to request authorization. Prescriptions will not be filled after 5 pm or on week-ends. °4. Avoid getting constipated.  Between the surgery and the pain medications, it is common to experience some constipation.  Increasing fluid intake and taking a fiber supplement (such as Metamucil, Citrucel, FiberCon, MiraLax, etc) 1-2 times a day regularly will usually help prevent this problem from occurring.  A mild laxative (prune juice, Milk of Magnesia, MiraLax, etc) should be taken according to package directions if there are no bowel movements after 48 hours.   °5. Watch out for diarrhea.  If you have many loose bowel movements, simplify your diet to bland foods & liquids for a few days.  Stop any stool softeners and decrease your fiber supplement.  Switching to mild anti-diarrheal medications (Kayopectate, Pepto Bismol) can help.  If this worsens or does not improve, please call us. °6. Wash / shower every day.  You may shower over the dressings as they are waterproof.  Continue to shower over incision(s) after the dressing is off. °7. Remove your waterproof bandages 5 days after surgery.  You may leave the incision open to air.  You may replace a dressing/Band-Aid to cover the incision for comfort if you wish.  °8. ACTIVITIES as tolerated:   °a. You may resume regular (light) daily activities beginning the next day--such as daily self-care, walking, climbing stairs--gradually increasing activities as tolerated.  If you can walk 30 minutes without difficulty, it   is safe to try more intense activity such as jogging, treadmill, bicycling, low-impact aerobics, swimming, etc. b. Save the most intensive and strenuous activity for last such as sit-ups, heavy lifting, contact sports, etc  Refrain from any heavy lifting or straining until you are off narcotics for pain control.   c. DO NOT PUSH THROUGH PAIN.  Let pain be your guide: If it hurts to do something, don't  do it.  Pain is your body warning you to avoid that activity for another week until the pain goes down. d. You may drive when you are no longer taking prescription pain medication, you can comfortably wear a seatbelt, and you can safely maneuver your car and apply brakes. e. Dennis Bast may have sexual intercourse when it is comfortable.  9. FOLLOW UP in our office a. Please call [facility] at [phone] to set up an appointment to see your surgeon in the office for a follow-up appointment approximately 2-3 weeks after your surgery. b. Make sure that you call for this appointment the day you arrive home to insure a convenient appointment time. 10. IF YOU HAVE DISABILITY OR FAMILY LEAVE FORMS, BRING THEM TO THE OFFICE FOR PROCESSING.  DO NOT GIVE THEM TO YOUR DOCTOR.   WHEN TO CALL us [phone]: 1. Poor pain control 2. Reactions / problems with new medications (rash/itching, nausea, etc)  3. Fever over 101.5 F (38.5 C) 4. Inability to urinate 5. Nausea and/or vomiting 6. Worsening swelling or bruising 7. Continued bleeding from incision. 8. Increased pain, redness, or drainage from the incision   The clinic staff is available to answer your questions during regular business hours (8:30am-5pm).  Please dont hesitate to call and ask to speak to one of our nurses for clinical concerns.   If you have a medical emergency, go to the nearest emergency room or call 911.  A surgeon from [facility] is always on call at the [facility], [place], [place], [place], [place]  [address] ? MAIN: [phone] ? TOLL FREE: [phone] ?  FAX [phone] [email or web address]  Small Bowel Obstruction A small bowel obstruction is a blockage (obstruction) of the small intestine (small bowel). The small bowel is a long, slender tube that connects the stomach to the colon. Its job is to absorb nutrients from the fluids and foods you consume  into the bloodstream.  CAUSES  There are many causes of intestinal blockage. The most common ones include:  Hernias. This is a more common cause in children than adults.  Inflammatory bowel disease (enteritis and colitis).  Twisting of the bowel (volvulus).  Tumors.  Scar tissue (adhesions) from previous surgery or radiation treatment.  Recent surgery. This may cause an acute small bowel obstruction called an ileus. SYMPTOMS   Abdominal pain. This may be dull cramps or sharp pain. It may occur in one area or may be present in the entire abdomen. Pain can range from mild to severe, depending on the degree of obstruction.  Nausea and vomiting. Vomit may be greenish or yellow bile color.  Distended or swollen stomach. Abdominal bloating is a common symptom.  Constipation.  Lack of passing gas.  Frequent belching.  Diarrhea. This may occur if runny stool is able to leak around the obstruction. DIAGNOSIS  Your caregiver can usually diagnose small bowel obstruction by taking a history, doing a physical exam, and taking X-rays. If the cause is unclear, a CT scan (computerized tomography) of  your abdomen and pelvis may be needed. TREATMENT  Treatment of the blockage depends on the cause and how bad the problem is.   Sometimes, the obstruction improves with bed rest and intravenous (IV) fluids.  Resting the bowel is very important. This means following a simple diet. Sometimes, a clear liquid diet may be required for several days.  Sometimes, a small tube (nasogastric tube) is placed into the stomach to decompress the bowel. When the bowel is blocked, it usually swells up like a balloon filled with air and fluids. Decompression means that the air and fluids are removed by suction through that tube. This can help with pain, discomfort, and nausea. It can also help the obstruction resolve faster.  Surgery may be required if other treatments do not work. Bowel obstruction from a hernia  may require early surgery and can be an emergency procedure. Adhesions that cause frequent or severe obstructions may also require surgery. HOME CARE INSTRUCTIONS If your bowel obstruction is only partial or incomplete, you may be allowed to go home.  Get plenty of rest.  Follow your diet as directed by your caregiver.  Only consume clear liquids until your condition improves.  Avoid solid foods as instructed. SEEK IMMEDIATE MEDICAL CARE IF:  You have increased pain or cramping.  You vomit blood.  You have uncontrolled vomiting or nausea.  You cannot drink fluids due to vomiting or pain.  You develop confusion.  You begin feeling very dry or thirsty (dehydrated).  You have severe bloating.  You have chills.  You have a fever.  You feel extremely weak or you faint. MAKE SURE YOU:  Understand these instructions.  Will watch your condition.    Will get help right away if you are not doing well or get worse. Document Released: 01/23/2006 Document Revised: 01/29/2012 Document Reviewed: 01/20/2011 [facility] Patient Information 2015 [place], [place]. This information is not intended to replace advice given to you by your health care provider. Make sure you discuss any questions you have with your health care provider.  GETTING TO GOOD BOWEL HEALTH. Irregular bowel habits such as constipation and diarrhea can lead to many problems over time.  Having one soft bowel movement a day is the most important way to prevent further problems.  The anorectal canal is designed to handle stretching and feces to safely manage our ability to get rid of solid waste (feces, poop, stool) out of our body.  BUT, hard constipated stools can act like ripping concrete bricks and diarrhea can be a burning fire to this very sensitive area of our body, causing inflamed hemorrhoids, anal fissures, increasing risk is perirectal abscesses, abdominal pain/bloating, an making irritable bowel worse.     The  goal: ONE SOFT BOWEL MOVEMENT A DAY!  To have soft, regular bowel movements:   Drink at least 8 tall glasses of water a day.    Take plenty of fiber.  Fiber is the undigested part of plant food that passes into the colon, acting s natures broom to encourage bowel motility and movement.  Fiber can absorb and hold large amounts of water. This results in a larger, bulkier stool, which is soft and easier to pass. Work gradually over several weeks up to 6 servings a day of fiber (25g a day even more if needed) in the form of: o Vegetables -- Root (potatoes, carrots, turnips), leafy green (lettuce, salad greens, celery, spinach), or cooked high residue (cabbage, broccoli, etc) o Fruit -- Fresh (unpeeled skin & pulp),  Dried (prunes, apricots, cherries, etc ),  or stewed ( applesauce)  o Whole grain breads, pasta, etc (whole wheat)  o Bran cereals   Bulking Agents -- This type of water-retaining fiber generally is easily obtained each day by one of the following:  o Psyllium bran -- The psyllium plant is remarkable because its ground seeds can retain so much water. This product is available as Metamucil, Konsyl, Effersyllium, Per Diem Fiber, or the less expensive generic preparation in drug and health food stores. Although labeled a laxative, it really is not a laxative.  o Methylcellulose -- This is another fiber derived from wood which also retains water. It is available as Citrucel. o Polyethylene Glycol - and artificial fiber commonly called Miralax or Glycolax.  It is helpful for people with gassy or bloated feelings with regular fiber o Flax Seed - a less gassy fiber than psyllium  No reading or other relaxing activity while on the toilet. If bowel movements take longer than 5 minutes, you are too constipated  AVOID CONSTIPATION.  High fiber and water intake usually takes care of this.  Sometimes a laxative is needed to stimulate more frequent bowel movements, but   Laxatives are not a good  long-term solution as it can wear the colon out. o Osmotics (Milk of Magnesia, Fleets phosphosoda, Magnesium citrate, MiraLax, GoLytely) are safer than  o Stimulants (Senokot, Castor Oil, Dulcolax, Ex Lax)    o Do not take laxatives for more than 7days in a row.   IF SEVERELY CONSTIPATED, try a Bowel Retraining Program: o Do not use laxatives.  o Eat a diet high in roughage, such as bran cereals and leafy vegetables.  o Drink six (6) ounces of prune or apricot juice each morning.  o Eat two (2) large servings of stewed fruit each day.  o Take one (1) heaping tablespoon of a psyllium-based bulking agent twice a day. Use sugar-free sweetener when possible to avoid excessive calories.  o Eat a normal breakfast.  o Set aside 15 minutes after breakfast to sit on the toilet, but do not strain to have a bowel movement.  o If you do not have a bowel movement by the third day, use an enema and repeat the above steps.   Controlling diarrhea o Switch to liquids and simpler foods for a few days to avoid stressing your intestines further. o Avoid dairy products (especially milk & ice cream) for a short time.  The intestines often can lose the ability to digest lactose when stressed. o Avoid foods that cause gassiness or bloating.  Typical foods include beans and other legumes, cabbage, broccoli, and dairy foods.  Every person has some sensitivity to other foods, so listen to our body and avoid those foods that trigger problems for you. o Adding fiber (Citrucel, Metamucil, psyllium, Miralax) gradually can help thicken stools by absorbing excess fluid and retrain the intestines to act more normally.  Slowly increase the dose over a few weeks.  Too much fiber too soon can backfire and cause cramping & bloating. o Probiotics (such as active yogurt, Align, etc) may help repopulate the intestines and colon with normal bacteria and calm down a sensitive digestive tract.  Most studies show it to be of mild help,  though, and such products can be costly. o Medicines: - Bismuth subsalicylate (ex. Kayopectate, Pepto Bismol) every 30 minutes for up to 6 doses can help control diarrhea.  Avoid if pregnant. - Loperamide (Immodium) can slow down diarrhea.  Start with two tablets (4mg  total) first and then try one tablet every 6 hours.  Avoid if you are having fevers or severe pain.  If you are not better or start feeling worse, stop all medicines and call your doctor for advice o Call your doctor if you are getting worse or not better.  Sometimes further testing (cultures, endoscopy, X-ray studies, bloodwork, etc) may be needed to help diagnose and treat the cause of the diarrhea.  Managing Pain  Pain after surgery or related to activity is often due to strain/injury to muscle, tendon, nerves and/or incisions.  This pain is usually short-term and will improve in a few months.   Many people find it helpful to do the following things TOGETHER to help speed the process of healing and to get back to regular activity more quickly:  1. Avoid heavy physical activity a.  no lifting greater than 20 pounds b. Do not push through the pain.  Listen to your body and avoid positions and maneuvers than reproduce the pain c. Walking is okay as tolerated, but go slowly and stop when getting sore.  d. Remember: If it hurts to do it, then dont do it! 2. Take Anti-inflammatory medication  a. Take with food/snack around the clock for 1-2 weeks i. This helps the muscle and nerve tissues become less irritable and calm down faster b. Choose ONE of the following over-the-counter medications: i. Naproxen 220mg  tabs (ex. Aleve) 1-2 pills twice a day  ii. Ibuprofen 200mg  tabs (ex. Advil, Motrin) 3-4 pills with every meal and just before bedtime iii. Acetaminophen 500mg  tabs (Tylenol) 1-2 pills with every meal and just before bedtime 3. Use a Heating pad or Ice/Cold Pack a. 4-6 times a day b. May use warm bath/hottub  or  showers 4. Try Gentle Massage and/or Stretching  a. at the area of pain many times a day b. stop if you feel pain - do not overdo it  Try these steps together to help you body heal faster and avoid making things get worse.  Doing just one of these things may not be enough.    If you are not getting better after two weeks or are noticing you are getting worse, contact our office for further advice; we may need to re-evaluate you & see what other things we can do to help.

## 2015-02-16 NOTE — Discharge Summary (Signed)
Physician Discharge Summary  Danielle Oconnell UMP:[id number] DOB: [birth year] DOA: 02/10/2015  PCP: Irven Shelling, MD  Admit date: 02/10/2015 Discharge date: 02/16/2015  Time spent: < 30 minutes  Recommendations for Outpatient Follow-up:  1. Follow up with Dr. Laurann Montana as needed 2. Follow up with Dr. Johney Maine in 2-3 weeks    Discharge Diagnoses:  Principal Problem:   Partial small bowel obstruction Active Problems:   Nausea and vomiting  Discharge Condition: stable  Diet recommendation: regular  Filed Weights   02/13/15 1020 02/13/15 2100 02/16/15 0500  Weight: 84.777 kg (186 lb 14.4 oz) 84.913 kg (187 lb 3.2 oz) 82.3 kg (181 lb 7 oz)   History of present illness:  80 y.o. Female with past medical history of irritable bowel syndrome, constipation who presented to [facility] ED with worsening abdominal pain with associated nausea and nonbloody vomiting. Patient was seen in emergency room you days ago, she had CT scan done which did not reveal acute findings and she subsequently was discharged home. Her symptoms did not improve. She reports having associated diarrhea but the last episode was 3 days prior to this admission. Patient reports abdominal discomfort, cramp-like pain, diffuse, intermittent. She also reported worsening abdominal bloating for last few days. No alleviating symptoms. No specific aggravating factors. No complaints of fevers or chills. No other complaints such as chest pain, shortness of breath or palpitations. No urinary complaints. In ED, patient was hemodynamically stable. Her blood work was unremarkable. CT abdomen on this admission was concerning for development of partial small bowel obstruction with the transition point in the right lower quadrant. Patient was admitted for further management of small bowel obstruction. Surgery has seen the patient in consultation.  Hospital Course:  Partial small bowel obstruction - s/p diagnostic laparoscopy 3/25 with lysis of  adhesions, improved, tolerating a diet - per surgery  Nausea and vomiting, abdominal bloating  - Secondary to SBO as noted above, improving  No other medical issues were active .  Discharge Exam: Filed Vitals:   02/15/15 0543 02/15/15 1257 02/15/15 2102 02/16/15 0500  BP: 119/65 147/81 121/68 135/75  Pulse: 83 81 87 78  Temp: 98.5 F (36.9 C) 98.2 F (36.8 C) 98.5 F (36.9 C) 98.2 F (36.8 C)  TempSrc: Oral Oral Oral Oral  Resp: 18 20 18 20   Height:      Weight:    82.3 kg (181 lb 7 oz)  SpO2: 95% 98% 96% 95%   General: NAD Cardiovascular: RRR Respiratory: CTA biL  Discharge Instructions  Discharge Instructions    Call MD for:  extreme fatigue    Complete by:  As directed      Call MD for:  hives    Complete by:  As directed      Call MD for:  persistant nausea and vomiting    Complete by:  As directed      Call MD for:  redness, tenderness, or signs of infection (pain, swelling, redness, odor or green/yellow discharge around incision site)    Complete by:  As directed      Call MD for:  severe uncontrolled pain    Complete by:  As directed      Call MD for:    Complete by:  As directed   Temperature > 101.64F     Diet - low sodium heart healthy    Complete by:  As directed      Discharge instructions    Complete by:  As directed  Please see discharge instruction sheets.  Also refer to handout given an office.  Please call our office if you have any questions or concerns [phone]     Discharge wound care:    Complete by:  As directed   If you have closed incisions, shower and bathe over these incisions with soap and water every day.  Remove all surgical dressings on postoperative day #3.  You do not need to replace dressings over the closed incisions unless you feel more comfortable with a Band-Aid covering it.   If you have an open wound that requires packing, please see wound care instructions.  In general, remove all dressings, wash wound with soap and  water and then replace with saline moistened gauze.  Do the dressing change at least every day.  Please call our office [phone] if you have further questions.     Driving Restrictions    Complete by:  As directed   No driving until off narcotics and can safely swerve away without pain during an emergency     Increase activity slowly    Complete by:  As directed   Walk an hour a day.  Use 20-30 minute walks.  When you can walk 30 minutes without difficulty, increase to low impact/moderate activities such as biking, jogging, swimming, sexual activity..  Eventually can increase to unrestricted activity when not feeling pain.  If you feel pain: STOP!Marland Kitchen   Let pain protect you from overdoing it.  Use ice/heat/over-the-counter pain medications to help minimize his soreness.  Use pain prescriptions as needed to remain active.  It is better to take extra pain medications and be more active than to stay bedridden to avoid all pain medications.     Lifting restrictions    Complete by:  As directed   Avoid heavy lifting initially.  Do not push through pain.  You have no specific weight limit.  Coughing and sneezing or four more stressful to your incision than any lifting you will do. Pain will protect you from injury.  Therefore, avoid intense activity until off all narcotic pain medications.  Coughing and sneezing or four more stressful to your incision than any lifting he will do.     May shower / Bathe    Complete by:  As directed      May walk up steps    Complete by:  As directed      Sexual Activity Restrictions    Complete by:  As directed   Sexual activity as tolerated.  Do not push through pain.  Pain will protect you from injury.     Walk with assistance    Complete by:  As directed   Walk over an hour a day.  May use a walker/cane/companion to help with balance and stamina.            Medication List    TAKE these medications        ALIGN 4 MG Caps  Take 4 mg by mouth daily.      Azelastine-Fluticasone 137-50 MCG/ACT Susp  Place 1 spray into the nose daily as needed (allergies).     C[place] 450 MG Caps  Take 450 mg by mouth 2 (two) times daily as needed (stool softner).     dicyclomine 20 MG tablet  Commonly known as:  BENTYL  Take 1 tablet (20 mg total) by mouth 2 (two) times daily.     guaiFENesin 600 MG 12 hr tablet  Commonly known  as:  MUCINEX  Take 600 mg by mouth daily.     HYDROcodone-acetaminophen 5-325 MG per tablet  Commonly known as:  NORCO  Take 1-2 tablets by mouth every 6 (six) hours as needed for moderate pain or severe pain.     lubiprostone 24 MCG capsule  Commonly known as:  AMITIZA  Take 24 mcg by mouth 2 (two) times daily with a meal.     ondansetron 4 MG tablet  Commonly known as:  ZOFRAN  Take 1 tablet (4 mg total) by mouth every 6 (six) hours.     Polyethyl Glycol-Propyl Glycol 0.4-0.3 % Soln  Place 1 drop into both eyes 3 (three) times daily as needed (dry eyes).           Follow-up Information    Follow up with Adin Hector., MD On 03/22/2015.   Specialty:  General Surgery   Why:  Please follow up with Dr. Johney Maine on Monday, May 2nd at [facility] information:   [address] [place] [address] [phone]       Follow up with Irven Shelling, MD. Schedule an appointment as soon as possible for a visit in 2 weeks.   Specialty:  Internal Medicine   Contact information:   [address] [place] [address] [phone]       The results of significant diagnostics from this hospitalization (including imaging, microbiology, ancillary and laboratory) are listed below for reference.    Significant Diagnostic Studies: Ct Abdomen Pelvis W Contrast  02/10/2015   CLINICAL DATA:  80 year old female with abdominal pain for several days, abdominal bloating.  EXAM: CT ABDOMEN AND PELVIS WITH CONTRAST  TECHNIQUE: Multidetector CT imaging of the abdomen and pelvis was performed  using the standard protocol following bolus administration of intravenous contrast.  CONTRAST:  133mL OMNIPAQUE IOHEXOL 300 MG/ML  SOLN  COMPARISON:  CT 3 days prior 02/07/2015  FINDINGS: The included lung bases are clear.  The stomach is distended with ingested contents. There is progressive small bowel dilatation with air and fluid throughout the abdomen from prior exam. A transition point is noted in the right lower quadrant, image number 63/85. There are 2 adjacent hyperdensities in the region of transition that measured 13 and 8 mm, these are felt to be extraluminal from bowel and may be within the right ovary or phleboliths. Distal small bowel loops are decompressed. There is oral contrast throughout the colon from prior CT. There is increased mesenteric haziness. Development of small amount of ascites in the upper abdomen. No free air or pneumatosis.  Small amount of perihepatic ascites. The liver, gallbladder, adrenal glands and pancreas are normal. Small splenic granuloma. Kidneys demonstrate symmetric enhancement and excretion. No hydronephrosis or perinephric stranding.  The abdominal aorta is normal in caliber. There is no retroperitoneal adenopathy.  Within the pelvis the uterus is surgically absent. Urinary bladder is near completely decompressed. Small volume of simple free fluid, increased in volume compared to prior. There is no pelvic adenopathy.  No acute or suspicious osseous abnormalities.  IMPRESSION: 1. Partial small bowel obstruction with transition point in the right lower quadrant. There is oral contrast in the right colon from CT 3 days prior. 2. Increased free fluid in the abdomen and pelvis. No free air or abscess.   Electronically Signed   By: Jeb Levering M.D.   On: 02/10/2015 06:29   Ct Abdomen Pelvis W Contrast  02/07/2015   CLINICAL DATA:  Acute  onset of generalized abdominal cramping, radiating to the back. Nausea. Initial encounter.  EXAM: CT ABDOMEN AND PELVIS WITH  CONTRAST  TECHNIQUE: Multidetector CT imaging of the abdomen and pelvis was performed using the standard protocol following bolus administration of intravenous contrast.  CONTRAST:  158mL OMNIPAQUE IOHEXOL 300 MG/ML  SOLN  COMPARISON:  Lumbar spine radiographs performed 05/07/2012  FINDINGS: The visualized lung bases are clear.  The liver and spleen are unremarkable in appearance. The gallbladder is within normal limits. The pancreas and adrenal glands are unremarkable. Focal extension of fat along the body of the pancreas is thought to remain within normal limits.  The kidneys are unremarkable in appearance. There is no evidence of hydronephrosis. No renal or ureteral stones are seen. No perinephric stranding is appreciated.  A small amount of free fluid is seen within the pelvis. There is minimal haziness about visualized distal small bowel loops, which given the patient's history of irritable bowel syndrome, could reflect mild acute inflammation. Visualized small bowel loops are partially filled with fluid, without significant distention. The stomach is within normal limits. No acute vascular abnormalities are seen.  The appendix is not definitely seen; there is no evidence of appendicitis. The colon is unremarkable in appearance, aside from minimal diverticulosis along the ascending colon.  The bladder is mildly distended and grossly unremarkable in appearance. The patient is status post hysterectomy. No suspicious adnexal masses are seen. Scattered calcification is noted within the right ovary. No inguinal lymphadenopathy is seen.  No acute osseous abnormalities are identified. Vacuum phenomenon is incidentally noted at L5-S1; seen in isolation, this is a normal finding. Sclerotic change is noted at the sacroiliac joints.  IMPRESSION: Minimal haziness about visualized distal small bowel loops, which given the patient's history of irritable bowel syndrome, could reflect mild acute inflammation. No evidence of  obstruction or significant inflammation. Small amount of free fluid seen in the pelvis. Otherwise unremarkable CT of the abdomen and pelvis.   Electronically Signed   By: Garald Balding M.D.   On: 02/07/2015 20:36   Dg Abd Acute W/chest  02/10/2015   CLINICAL DATA:  Mid abdominal pain for 1 week.  EXAM: ACUTE ABDOMEN SERIES (ABDOMEN 2 VIEW & CHEST 1 VIEW)  COMPARISON:  CT 02/07/2015 3 days prior.  FINDINGS: The cardiomediastinal contours are normal. The lungs are clear. There is no free intra-abdominal air. There are prominent small bowel loops with air-fluid levels in the central abdomen. The oral contrast from prior CT is seen within the ascending and proximal transverse colon. An air-fluid level is noted in the stomach. No radiopaque calculi. No acute osseous abnormalities are seen.  IMPRESSION: Dilated air-filled small bowel loops with air-fluid levels, the degree of small bowel distention has progressed from prior exam. This may reflect ileus or partial small bowel obstruction. There is no evidence of complete obstruction with oral contrast from prior CT now within the colon.   Electronically Signed   By: Jeb Levering M.D.   On: 02/10/2015 03:27   Dg Abd Portable 1v  02/12/2015   CLINICAL DATA:  Known bowel obstruction, preoperative evaluation  EXAM: PORTABLE ABDOMEN - 1 VIEW  COMPARISON:  02/10/2015  FINDINGS: A nasogastric catheter is noted but does not extend into the stomach. This has withdrawn in the interval from the prior exam. Contrast material is noted throughout the colon from the prior CT of 2 days previous. Persistent dilated small bowel is noted in the left abdomen. No free air is seen. No bony  abnormality is noted.  IMPRESSION: Persistent dilated small bowel on the left. The overall appearance has improved in the interval from the prior exam. These changes are consistent with a partial small bowel obstruction. The nasogastric catheter has withdrawn into the distal esophagus.    Electronically Signed   By: Inez Catalina M.D.   On: 02/12/2015 07:57   Dg Abd Portable 1v-small Bowel Obstruction Protocol-initial, 8 Hr Delay  02/11/2015   CLINICAL DATA:  Small bowel obstruction protocol. 8 hour delayed film, status post Gastrografin administration. Assess for passage of contrast. Subsequent encounter.  EXAM: PORTABLE ABDOMEN - 1 VIEW  COMPARISON:  Abdominal radiograph performed earlier today at 11:33 a.m., and CT of the abdomen and pelvis performed earlier today at 6:15 a.m.  FINDINGS: Contrast infused into the stomach remains at the fundus of the stomach. 90 mL of Gastrografin contrast was administered.  There is persistent diffuse distention of small bowel loops throughout the abdomen, compatible with previously noted small bowel obstruction. Contrast within the cecum and ascending colon reflects the study from March 20th; this contrast has not changed in position since the recent prior study, concerning for significant colonic dysmotility.  No acute osseous abnormalities are seen. Contrast is seen partially filling the bladder. The patient's enteric tube is noted ending overlying the body of the stomach.  IMPRESSION: 1. Contrast infused into the stomach remains at the fundus of the stomach, after 8 hours. Would follow-up with 24-hour film, as per small-bowel obstruction protocol. 2. Persistent diffuse distention of small bowel loops throughout the abdomen, compatible with previously noted small bowel obstruction. 3. Contrast within the cecum and ascending colon (from the CT performed 02/07/2015) has not changed in position since the prior CT from earlier this morning, concerning for significant underlying colonic dysmotility.   Electronically Signed   By: Garald Balding M.D.   On: 02/11/2015 02:18   Dg Abd Portable 1v-small Bowel Protocol-position Verification  02/10/2015   CLINICAL DATA:  NG tube placement  EXAM: PORTABLE ABDOMEN - 1 VIEW  COMPARISON:  CT 02/10/2015  FINDINGS: NG tube  is in place with the tip in the proximal to mid stomach. Dilated small bowel loops in the abdomen and upper pelvis compatible with small bowel obstruction. Oral contrast material seen within the right colon compatible with partial small bowel obstruction.  IMPRESSION: NG tube tip in the proximal to mid stomach.  Partial small bowel obstruction pattern.   Electronically Signed   By: Rolm Baptise M.D.   On: 02/10/2015 12:11    Microbiology: Recent Results (from the past 240 hour(s))  Surgical PCR screen     Status: None   Collection Time: 02/12/15  5:27 AM  Result Value Ref Range Status   MRSA, PCR NEGATIVE NEGATIVE Final   Staphylococcus aureus NEGATIVE NEGATIVE Final    Comment:        The Xpert SA Assay (FDA approved for NASAL specimens in patients over 2 years of age), is one component of a comprehensive surveillance program.  Test performance has been validated by [facility] for patients greater than or equal to 49 year old. It is not intended to diagnose infection nor to guide or monitor treatment.      Labs: Basic Metabolic Panel:  Recent Labs Lab 02/10/15 0517 02/11/15 0525 02/12/15 0525 02/13/15 0544 02/14/15 0508  NA 140 140 140 140 140  K 3.5 4.2 3.6 3.9 3.5  CL 98 102 106 105 106  CO2  --  30 27 25  28  GLUCOSE 122* 103* 91 88 94  BUN 11 12 12 11 8   CREATININE 0.90 0.80 0.76 0.65 0.70  CALCIUM  --  8.8 8.4 8.6 8.2*   Liver Function Tests:  Recent Labs Lab 02/11/15 0525 02/12/15 0525  AST 18 17  ALT 10 11  ALKPHOS 48 43  BILITOT 0.8 0.8  PROT 6.1 5.6*  ALBUMIN 3.7 3.1*   No results for input(s): LIPASE, AMYLASE in the last 168 hours. No results for input(s): AMMONIA in the last 168 hours. CBC:  Recent Labs Lab 02/10/15 0510 02/10/15 0517 02/11/15 0525 02/12/15 0525 02/13/15 0544 02/14/15 0508  WBC 9.5  --  6.8 6.9 7.6 6.4  NEUTROABS 7.3  --   --   --   --   --   HGB 13.9 15.3* 13.2 12.0 11.9* 12.0  HCT 42.9 45.0 40.9 36.7 36.9 36.1    MCV 90.9  --  92.1 92.7 91.6 91.9  PLT 448*  --  425* 383 361 340   Cardiac Enzymes: No results for input(s): CKTOTAL, CKMB, CKMBINDEX, TROPONINI in the last 168 hours. BNP: BNP (last 3 results) No results for input(s): BNP in the last 8760 hours.  ProBNP (last 3 results) No results for input(s): PROBNP in the last 8760 hours.  CBG:  Recent Labs Lab 02/11/15 0730 02/13/15 0747 02/14/15 0739 02/15/15 0728 02/16/15 0721  GLUCAP 103* 77 88 83 88       Signed:  Wafaa Deemer  [facility] 02/16/2015, 12:56 PM

## 2015-02-16 NOTE — Progress Notes (Signed)
Patient ID: Danielle Oconnell, female   DOB: 1935-07-31, 80 y.o.   MRN: [id number]     [facility]      [address] [place], [place] [address]    Phone: [phone] FAX: [phone]     Subjective: Pt had a BM, tolerating solids, ambulating, pain well controlled.  VSS.  Afebrile.    Objective:  Vital signs:  Filed Vitals:   02/15/15 0543 02/15/15 1257 02/15/15 2102 02/16/15 0500  BP: 119/65 147/81 121/68 135/75  Pulse: 83 81 87 78  Temp: 98.5 F (36.9 C) 98.2 F (36.8 C) 98.5 F (36.9 C) 98.2 F (36.8 C)  TempSrc: Oral Oral Oral Oral  Resp: 18 20 18 20   Height:      Weight:    82.3 kg (181 lb 7 oz)  SpO2: 95% 98% 96% 95%    Last BM Date: 02/14/15  Intake/Output   Yesterday:  03/28 0701 - 03/29 0700 In: 240 [P.O.:240] Out: 900 [Urine:900] This shift:    I/O last 3 completed shifts: In: 240 [P.O.:240] Out: 2575 [Urine:2575]    Physical Exam: General: Pt awake/alert/oriented x4 in no acute distress Abdomen: Soft.  Nondistended.   Mildly tender at incisions only.  Incisions are healing well, no erythema or drainage. No evidence of peritonitis.  No incarcerated hernias.    Problem List:   Principal Problem:   Partial small bowel obstruction Active Problems:   Nausea and vomiting    Results:   Labs: Results for orders placed or performed during the hospital encounter of 02/10/15 (from the past 48 hour(s))  Glucose, capillary     Status: None   Collection Time: 02/15/15  7:28 AM  Result Value Ref Range   Glucose-Capillary 83 70 - 99 mg/dL   Comment 1 Notify RN   Glucose, capillary     Status: None   Collection Time: 02/16/15  7:21 AM  Result Value Ref Range   Glucose-Capillary 88 70 - 99 mg/dL   Comment 1 Notify RN     Imaging / Studies: No results found.  Medications / Allergies:  Scheduled Meds: . ALIGN  4 mg Oral Daily  . antiseptic oral rinse  7 mL Mouth Rinse BID  . bisacodyl  10 mg Rectal  Daily  . guaiFENesin  600 mg Oral Daily  . lip balm  1 application Topical BID  . polyethylene glycol  17 g Oral Daily  . sodium chloride  3 mL Intravenous Q12H   Continuous Infusions:  PRN Meds:.sodium chloride, [DISCONTINUED] acetaminophen **OR** acetaminophen, acetaminophen, alum & mag hydroxide-simeth, Azelastine-Fluticasone, diphenhydrAMINE, HYDROmorphone (DILAUDID) injection, lactated ringers, magic mouthwash, menthol-cetylpyridinium, metoCLOPramide (REGLAN) injection, ondansetron **OR** ondansetron (ZOFRAN) IV, phenol, polyvinyl alcohol, promethazine, sodium chloride, traMADol  Antibiotics: Anti-infectives    Start     Dose/Rate Route Frequency Ordered Stop   02/12/15 0800  metroNIDAZOLE (FLAGYL) IVPB 500 mg    Comments:  Pharmacy may adjust dosing strength, interval, or rate of medication as needed for optimal therapy for the patient Send with patient on call to the OR.  Anesthesia to complete antibiotic administration <51min prior to incision per [facility].   500 mg 100 mL/hr over 60 Minutes Intravenous On call to O.R. 02/12/15 8144 02/12/15 0815   02/12/15 0715  clindamycin (CLEOCIN) 900 mg, gentamicin (GARAMYCIN) 240 mg in sodium chloride 0.9 % 1,000 mL for intraperitoneal lavage  Status:  Discontinued      Intraperitoneal To Surgery 02/12/15 0714 02/12/15 8185  02/12/15 0600  ceFAZolin (ANCEF) IVPB 2 g/50 mL premix    Comments:  Pharmacy may adjust dosing strength, interval, or rate of medication as needed for optimal therapy for the patient  Send with patient on call to the OR.  Anesthesia to complete antibiotic administration <49min prior to incision per [facility].   2 g 100 mL/hr over 30 Minutes Intravenous On call to O.R. 02/11/15 1353 02/12/15 0758       Assessment/Plan POD#4 Diagnostic laparoscopy with LOA---Dr. Johney Maine -stable for DC from surgical standpoint -instructions provided -pain control--tramadol and tylenol -follow up with Dr. Johney Maine in 2-3  weeks  Erby Pian, [facility] Surgery Pager [phone](7A-[phone])   02/16/2015 8:27 AM

## 2015-08-23 ENCOUNTER — Other Ambulatory Visit: Payer: Self-pay | Admitting: Gastroenterology

## 2015-11-01 ENCOUNTER — Encounter (HOSPITAL_COMMUNITY): Payer: Self-pay | Admitting: *Deleted

## 2015-11-08 ENCOUNTER — Ambulatory Visit (HOSPITAL_COMMUNITY)
Admission: RE | Admit: 2015-11-08 | Discharge: 2015-11-08 | Disposition: A | Discharge status: Home / Self Care | Payer: 59 | Source: Ambulatory Visit | Attending: Gastroenterology | Admitting: Gastroenterology

## 2015-11-08 ENCOUNTER — Ambulatory Visit (HOSPITAL_COMMUNITY): Payer: 59 | Admitting: Anesthesiology

## 2015-11-08 ENCOUNTER — Encounter (HOSPITAL_COMMUNITY)
Admission: RE | Disposition: A | Discharge status: Home / Self Care | Payer: Self-pay | Source: Ambulatory Visit | Attending: Gastroenterology

## 2015-11-08 ENCOUNTER — Encounter (HOSPITAL_COMMUNITY): Payer: Self-pay | Admitting: *Deleted

## 2015-11-08 DIAGNOSIS — K449 Diaphragmatic hernia without obstruction or gangrene: Secondary | ICD-10-CM | POA: Insufficient documentation

## 2015-11-08 DIAGNOSIS — Z86010 Personal history of colonic polyps: Secondary | ICD-10-CM | POA: Insufficient documentation

## 2015-11-08 DIAGNOSIS — Z1211 Encounter for screening for malignant neoplasm of colon: Secondary | ICD-10-CM | POA: Insufficient documentation

## 2015-11-08 DIAGNOSIS — M1990 Unspecified osteoarthritis, unspecified site: Secondary | ICD-10-CM | POA: Insufficient documentation

## 2015-11-08 DIAGNOSIS — K219 Gastro-esophageal reflux disease without esophagitis: Secondary | ICD-10-CM | POA: Diagnosis not present

## 2015-11-08 DIAGNOSIS — D125 Benign neoplasm of sigmoid colon: Secondary | ICD-10-CM | POA: Diagnosis not present

## 2015-11-08 DIAGNOSIS — Z87891 Personal history of nicotine dependence: Secondary | ICD-10-CM | POA: Insufficient documentation

## 2015-11-08 HISTORY — PX: COLONOSCOPY WITH PROPOFOL: SHX5780

## 2015-11-08 HISTORY — DX: Personal history of other diseases of the digestive system: Z87.19

## 2015-11-08 HISTORY — DX: Nontoxic goiter, unspecified: E04.9

## 2015-11-08 HISTORY — DX: Allergy status to unspecified drugs, medicaments and biological substances: Z88.9

## 2015-11-08 HISTORY — DX: Unspecified osteoarthritis, unspecified site: M19.90

## 2015-11-08 SURGERY — COLONOSCOPY WITH PROPOFOL
Anesthesia: Monitor Anesthesia Care

## 2015-11-08 MED ORDER — PROPOFOL 500 MG/50ML IV EMUL: INTRAVENOUS | Status: DC | PRN

## 2015-11-08 MED ORDER — PROPOFOL 500 MG/50ML IV EMUL
INTRAVENOUS | Status: DC | PRN
  Administered 2015-11-08: 100 ug/kg/min via INTRAVENOUS

## 2015-11-08 MED ORDER — LACTATED RINGERS IV SOLN
INTRAVENOUS | Status: DC
  Administered 2015-11-08: 1000 mL via INTRAVENOUS

## 2015-11-08 MED ORDER — LIDOCAINE HCL (CARDIAC) 20 MG/ML IV SOLN
INTRAVENOUS | Status: AC
  Filled 2015-11-08: qty 5

## 2015-11-08 MED ORDER — PROPOFOL 500 MG/50ML IV EMUL
INTRAVENOUS | Status: DC | PRN
  Administered 2015-11-08: 20 mg via INTRAVENOUS
  Administered 2015-11-08: 50 mg via INTRAVENOUS

## 2015-11-08 MED ORDER — SODIUM CHLORIDE 0.9 % IV SOLN: INTRAVENOUS | Status: DC

## 2015-11-08 MED ORDER — PROPOFOL 10 MG/ML IV BOLUS
INTRAVENOUS | Status: AC
  Filled 2015-11-08: qty 40

## 2015-11-08 SURGICAL SUPPLY — 22 items

## 2015-11-08 NOTE — Op Note (Signed)
Procedure: Surveillance colonoscopy. History of adenomatous colon polyp with villous component removed colonoscopically in the past  Endoscopist: Earle Gell  Premedication: Propofol administered by anesthesia  Procedure: The patient was placed in the left lateral decubitus position. Anal inspection and digital rectal exam were normal. The Pentax pediatric colonoscope was introduced into the rectum and advanced to the cecum. A normal-appearing appendiceal orifice and ileocecal valve were identified. Colonic preparation for the exam today was good. Withdrawal time was 16 minutes  Rectum. Normal. Retroflexed view of the distal rectum was normal  Sigmoid colon. From the distal sigmoid colon, a 3 mm sessile polyp was removed with the cold biopsy forceps, and 8 mm pedunculated polyp was removed with the electrocautery snare and retrieved for pathological evaluation, and a 5 mm pedunculated polyp was removed with the electrocautery snare but not retrieved for pathological evaluation.  Descending colon. Normal  Splenic flexure. Normal  Transverse colon. Normal  Hepatic flexure. Normal  Ascending colon. Normal  Cecum and ileocecal valve. Normal  Assessment: From the distal sigmoid colon, a 3 mm sized polyp, an 8 mm pedunculated polyp and a 5 mm pedunculated polyp removed. Otherwise normal colonoscopy.

## 2015-11-08 NOTE — Anesthesia Preprocedure Evaluation (Addendum)
Anesthesia Evaluation  Patient identified by MRN, date of birth, ID band Patient awake    Reviewed: Allergy & Precautions, NPO status , Patient's Chart, lab work & pertinent test results  Airway Mallampati: II  TM Distance: >3 FB Neck ROM: Full    Dental  (+) Dental Advisory Given   Pulmonary former smoker,    breath sounds clear to auscultation       Cardiovascular negative cardio ROS   Rhythm:Regular Rate:Normal     Neuro/Psych negative neurological ROS  negative psych ROS   GI/Hepatic Neg liver ROS, hiatal hernia,   Endo/Other  negative endocrine ROS  Renal/GU negative Renal ROS  negative genitourinary   Musculoskeletal  (+) Arthritis ,   Abdominal   Peds negative pediatric ROS (+)  Hematology negative hematology ROS (+)   Anesthesia Other Findings   Reproductive/Obstetrics negative OB ROS                            Lab Results  Component Value Date   WBC 6.4 02/14/2015   HGB 12.0 02/14/2015   HCT 36.1 02/14/2015   MCV 91.9 02/14/2015   PLT 340 02/14/2015   Lab Results  Component Value Date   CREATININE 0.70 02/14/2015   BUN 8 02/14/2015   NA 140 02/14/2015   K 3.5 02/14/2015   CL 106 02/14/2015   CO2 28 02/14/2015   No results found for: INR, PROTIME   Anesthesia Physical Anesthesia Plan  ASA: II  Anesthesia Plan: MAC   Post-op Pain Management:    Induction: Intravenous  Airway Management Planned: Natural Airway  Additional Equipment:   Intra-op Plan:   Post-operative Plan:   Informed Consent: I have reviewed the patients History and Physical, chart, labs and discussed the procedure including the risks, benefits and alternatives for the proposed anesthesia with the patient or authorized representative who has indicated his/her understanding and acceptance.     Plan Discussed with: CRNA  Anesthesia Plan Comments:         Anesthesia Quick  Evaluation

## 2015-11-08 NOTE — Anesthesia Postprocedure Evaluation (Signed)
Anesthesia Post Note  Patient: Maylon Peppers  Procedure(s) Performed: Procedure(s) (LRB): COLONOSCOPY WITH PROPOFOL (N/A)  Patient location during evaluation: PACU Anesthesia Type: MAC Level of consciousness: awake and alert Pain management: pain level controlled Vital Signs Assessment: post-procedure vital signs reviewed and stable Respiratory status: spontaneous breathing, nonlabored ventilation, respiratory function stable and patient connected to nasal cannula oxygen Cardiovascular status: stable and blood pressure returned to baseline Anesthetic complications: no    Last Vitals:  Filed Vitals:   11/08/15 0848 11/08/15 1035  BP: 127/64 105/54  Pulse: 67 66  Temp: 36.4 C 36.6 C  Resp: 16 13    Last Pain: There were no vitals filed for this visit.               Effie Berkshire

## 2015-11-08 NOTE — Discharge Instructions (Signed)
Colonoscopy °A colonoscopy is an exam to look at your colon. This exam can help find lumps (tumors), growths (polyps), bleeding, and redness and puffiness (inflammation) in your colon.  °BEFORE THE PROCEDURE °· Ask your doctor about changing or stopping your regular medicines. °· You may need to drink a large amount of a special liquid (oral bowel prep). You start drinking this the day before your procedure. It will cause you to have watery poop (stool). This cleans out your colon. °· Do not eat or drink anything else once you have started the bowel prep, unless your doctor tells you it is safe to do so. °· Make plans for someone to drive you home after the procedure. °PROCEDURE °· You will be given medicine to help you relax (sedative). °· You will lie on your side with your knees bent. °· A tube with a camera on the end is put in the opening of your butt (anus) and into your colon. Pictures are sent to a computer screen. Your doctor will look for anything that is not normal. °· Your doctor may take a tissue sample (biopsy) from your colon to be looked at more closely. °· The exam is finished when your doctor has viewed all of the colon. °AFTER THE PROCEDURE °· Do not drive for 24 hours after the exam. °· You may have a small amount of blood in your poop. This is normal. °· You may pass gas and have belly (abdominal) cramps. This is normal. °· Ask when your test results will be ready. Make sure you get your test results. °  °This information is not intended to replace advice given to you by your health care provider. Make sure you discuss any questions you have with your health care provider. °  °Document Released: 12/09/2010 Document Revised: 11/11/2013 Document Reviewed: 07/14/2013 °Elsevier Interactive Patient Education ©2016 [employer] ° °

## 2015-11-08 NOTE — Transfer of Care (Signed)
Immediate Anesthesia Transfer of Care Note  Patient: Danielle Oconnell  Procedure(s) Performed: Procedure(s): COLONOSCOPY WITH PROPOFOL (N/A)  Patient Location: PACU  Anesthesia Type:MAC  Level of Consciousness: awake, alert  and oriented  Airway & Oxygen Therapy: Patient Spontanous Breathing and Patient connected to face mask oxygen  Post-op Assessment: Report given to RN and Post -op Vital signs reviewed and stable  Post vital signs: Reviewed and stable  Last Vitals:  Filed Vitals:   11/08/15 0848  BP: 127/64  Pulse: 67  Temp: 36.4 C  Resp: 16    Complications: No apparent anesthesia complications

## 2015-11-08 NOTE — H&P (Signed)
  Procedure: Surveillance colonoscopy. Normal surveillance colonoscopy performed on 02/07/2010. History of adenomatous colon polyp with villous component removed in the past.  History: The patient is a 80 year old female born 10/18/1935. In March 2016, she was hospitalized to treat small bowel obstruction and underwent laparoscopic adhesion lysis.  She is scheduled to undergo a repeat surveillance colonoscopy today.  Past medical history: Multinodular goiter. Allergic rhinitis. Gastroesophageal reflux. Irritable bowel syndrome. Overactive urinary bladder. Vitamin D deficiency. Diastolic dysfunction found on echocardiogram in July 2011. Hysterectomy. Foot surgery to remove a fatty tumor. Laparoscopic adhesion lysis to treat small bowel obstruction in March 2016.  Medication allergies: Sulfa drugs cause rash  Exam: The patient is alert and lying comfortably on the endoscopy stretcher. Abdomen is soft and nontender to palpation. Cardiac exam reveals a regular rhythm. Lungs are clear to auscultation.  Plan: Proceed with surveillance colonoscopy

## 2015-11-09 ENCOUNTER — Encounter (HOSPITAL_COMMUNITY): Payer: Self-pay | Admitting: Gastroenterology

## 2015-11-29 ENCOUNTER — Ambulatory Visit: Payer: Self-pay | Admitting: Allergy and Immunology

## 2015-12-14 ENCOUNTER — Encounter: Payer: Self-pay | Admitting: Allergy and Immunology

## 2015-12-14 ENCOUNTER — Ambulatory Visit (INDEPENDENT_AMBULATORY_CARE_PROVIDER_SITE_OTHER): Payer: 59 | Admitting: Allergy and Immunology

## 2015-12-14 VITALS — BP 110/75 | HR 75 | Resp 16

## 2015-12-14 DIAGNOSIS — J310 Chronic rhinitis: Secondary | ICD-10-CM

## 2015-12-14 MED ORDER — AZELASTINE-FLUTICASONE 137-50 MCG/ACT NA SUSP: 1.0000 | Freq: Every day | NASAL | Status: DC | PRN

## 2015-12-14 NOTE — Progress Notes (Signed)
    Follow-up Note  RE: TYLEA MCKINNEY MRN: [id number] DOB: May 10, 1935 Date of Office Visit: 12/14/2015  Primary care provider: Irven Shelling, MD Referring provider: Lavone Orn, MD  History of present illness: HPI Comments: Jamilette Colyar is a 81 y.o. female with nonallergic rhinitis who presents today for follow up.  She reports that her symptoms have been well-controlled over the past year with Dymista nasal spray as needed and nasal saline lavage as needed.  She needs a refill for Dymista.     Assessment and plan: No problem-specific assessment & plan notes found for this encounter.   No orders of the defined types were placed in this encounter.      Physical examination: Blood pressure 110/75, pulse 75, resp. rate 16.  General: Alert, interactive, in no acute distress. HEENT: TMs pearly gray, turbinates moderately edematous without discharge, post-pharynx moderately erythematous. Neck: Supple without lymphadenopathy. Lungs: Clear to auscultation without wheezing, rhonchi or rales. CV: Normal S1, S2 without murmurs. Skin: Warm and dry, without lesions or rashes.  The following portions of the patient's history were reviewed and updated as appropriate: allergies, current medications, past family history, past medical history, past social history, past surgical history and problem list.    Medication List       This list is accurate as of: 12/14/15 10:57 AM.  Always use your most recent med list.               ALIGN 4 MG Caps  Take 4 mg by mouth daily.     Azelastine-Fluticasone 137-50 MCG/ACT Susp  Place 1 spray into the nose daily as needed (allergies).     dextromethorphan-guaiFENesin 30-600 MG 12hr tablet  Commonly known as:  MUCINEX DM  Take 1 tablet by mouth daily. Reported on 12/14/2015     Evening Primrose Oil 500 MG Caps  Take 1 capsule by mouth daily.     Omega 3 1000 MG Caps  Take 1 capsule by mouth daily.     ondansetron 4 MG tablet    Commonly known as:  ZOFRAN  Take 1 tablet (4 mg total) by mouth every 6 (six) hours.     Polyethyl Glycol-Propyl Glycol 0.4-0.3 % Soln  Place 1 drop into both eyes 3 (three) times daily as needed (dry eyes). Reported on 12/14/2015     polyethylene glycol packet  Commonly known as:  MIRALAX / GLYCOLAX  Take 17 g by mouth daily as needed for mild constipation, moderate constipation or severe constipation.     sodium chloride 0.65 % Soln nasal spray  Commonly known as:  OCEAN  Place 1 spray into both nostrils as needed (allergies). Reported on 12/14/2015     vitamin C 1000 MG tablet  Take 1,000 mg by mouth daily.     zinc gluconate 50 MG tablet  Take 50 mg by mouth daily. Reported on 12/14/2015        Allergies  Allergen Reactions  . Sulfa Antibiotics Hives and Itching    Required Benadryl in ED    I appreciate the opportunity to take part in this Taheera's care. Please do not hesitate to contact me with questions.  Sincerely,   R. Edgar Frisk, MD

## 2015-12-14 NOTE — Patient Instructions (Addendum)
Non-allergic rhinitis Well-controlled on the current regimen.  Continue Dymista nasal spray as needed and nasal saline lavage as needed.  A refill prescription has been provided for Dymista.    Return in about 1 year (around 12/13/2016), or if symptoms worsen or fail to improve.

## 2015-12-14 NOTE — Assessment & Plan Note (Addendum)
Well-controlled on the current regimen.  Continue Dymista nasal spray as needed and nasal saline lavage as needed.  A refill prescription has been provided for Dymista.

## 2016-01-13 ENCOUNTER — Ambulatory Visit (INDEPENDENT_AMBULATORY_CARE_PROVIDER_SITE_OTHER): Payer: 59 | Admitting: Allergy and Immunology

## 2016-01-13 VITALS — BP 132/64 | HR 64 | Temp 99.1°F | Resp 16 | Ht 61.52 in | Wt 181.9 lb

## 2016-01-13 DIAGNOSIS — J310 Chronic rhinitis: Secondary | ICD-10-CM | POA: Diagnosis not present

## 2016-01-13 DIAGNOSIS — R059 Cough, unspecified: Secondary | ICD-10-CM

## 2016-01-13 DIAGNOSIS — J069 Acute upper respiratory infection, unspecified: Secondary | ICD-10-CM

## 2016-01-13 DIAGNOSIS — R05 Cough: Secondary | ICD-10-CM

## 2016-01-13 NOTE — Progress Notes (Signed)
     FOLLOW UP NOTE  RE: Danielle Oconnell MRN: [id number] DOB: [birth year] [facility] [place] [address] [place] [id number] Date of Office Visit: 01/13/2016  Subjective:  Danielle Oconnell is a 81 y.o. female who presents today for Nasal Congestion  Assessment:   1. Cough, afebrile in no respiratory distress with clear lung exam and normal in office spirometry.    2. Non-allergic rhinitis.   3. Upper respiratory infection, likely viral.     Plan:   Patient Instructions  1.  Begin Saline nasal wash 2-4 times daily. 2.  Single ingredient Mucinex one tab twice daily as needed. (Stop the Mucinex DM). 3.  Use Dymista one spray once daily and as needed twice daily. 4.  May add Phenylephrine once to twice daily if needed over the next several days. 5.  Call office with new difficulty, fever, persisting symptoms, questions or concerns. 6.  Work note given for today.   7.  Keep follow-up with Dr. Verlin Fester as scheduled.  HPI: Danielle Oconnell returns to the office with nasal congestion, intermittently associated with cough.  She states her symptoms began about 4 days ago without fever, headache, sore throat, change in appetite, activity or sleep.  She occasionally feels postnasal drip and phlegm in her throat but denies chest tightness, chest congestion, difficulty breathing or shortness of breath.  She is mostly concerned about going to work with these symptoms and is asking for note today.  She otherwise has been doing well since her visit with Dr. Verlin Fester last month.  Recently she has not been using any Dymista.  Denies ED or urgent care visits, prednisone or antibiotic courses. Reports sleep and activity are normal.  Danielle Oconnell has a current medication list which includes the following prescription(s): vitamin c, azelastine-fluticasone, dextromethorphan-guaifenesin, evening primrose oil, omega 3, polyethyl glycol-propyl glycol, polyethylene glycol, align, and sodium chloride.    Drug Allergies: Allergies  Allergen Reactions  . Sulfa Antibiotics Hives and Itching    Required Benadryl in ED   Objective:   Filed Vitals:   01/13/16 1341  BP: 132/64  Pulse: 64  Temp: 99.1 F (37.3 C)  Resp: 16   Physical Exam  Constitutional: She is well-developed, well-nourished, and in no distress.  HENT:  Head: Atraumatic.  Right Ear: Tympanic membrane and ear canal normal.  Left Ear: Tympanic membrane and ear canal normal.  Nose: Mucosal edema (mild bilaterally.) and rhinorrhea (clear mucus bilaterally.) present. No epistaxis.  Mouth/Throat: Oropharynx is clear and moist and mucous membranes are normal. No oropharyngeal exudate, posterior oropharyngeal edema or posterior oropharyngeal erythema.  Neck: Neck supple.  Cardiovascular: Normal rate, S1 normal and S2 normal.   No murmur heard. Pulmonary/Chest: Effort normal. She has no wheezes. She has no rhonchi. She has no rales.  Excellent aeration.  Post Xopenex neb: Continues to be clear without adventitious breath sounds.  Lymphadenopathy:    She has no cervical adenopathy.   Diagnostics: Spirometry: FVC 2.44-80%, FEV1 1.53-84%.  Essentially no change postbronchodilator.   Roselyn M. Ishmael Holter, MD  cc: Irven Shelling, MD

## 2016-01-13 NOTE — Patient Instructions (Addendum)
    Saline nasal wash 2-4 times daily.   Mucinex one tab twice daily as needed.   Use Dymista one spray once daily and as needed twice daily.   May add Phenylephrine once to twice daily if needed.  Call office with new difficulty, fever, persisting symptoms, questions or concerns.   Keep follow-up with Dr. Verlin Fester as scheduled.

## 2016-03-23 DIAGNOSIS — M25561 Pain in right knee: Secondary | ICD-10-CM | POA: Diagnosis not present

## 2016-03-23 DIAGNOSIS — M79671 Pain in right foot: Secondary | ICD-10-CM | POA: Diagnosis not present

## 2016-05-01 DIAGNOSIS — F4320 Adjustment disorder, unspecified: Secondary | ICD-10-CM | POA: Diagnosis not present

## 2016-05-01 MED FILL — ALPRAZolam 0.25 MG TABS: 0.25 | 30 days supply | Qty: 10 | Fill #0

## 2016-05-08 DIAGNOSIS — M25552 Pain in left hip: Secondary | ICD-10-CM | POA: Diagnosis not present

## 2016-05-09 MED FILL — predniSONE 10 MG TABS: 10 | 13 days supply | Qty: 21 | Fill #0

## 2016-05-19 DIAGNOSIS — E042 Nontoxic multinodular goiter: Secondary | ICD-10-CM | POA: Diagnosis not present

## 2016-05-29 DIAGNOSIS — E041 Nontoxic single thyroid nodule: Secondary | ICD-10-CM | POA: Diagnosis not present

## 2016-05-29 DIAGNOSIS — E042 Nontoxic multinodular goiter: Secondary | ICD-10-CM | POA: Diagnosis not present

## 2016-05-31 DIAGNOSIS — H52203 Unspecified astigmatism, bilateral: Secondary | ICD-10-CM | POA: Diagnosis not present

## 2016-09-25 DIAGNOSIS — R946 Abnormal results of thyroid function studies: Secondary | ICD-10-CM | POA: Diagnosis not present

## 2016-09-25 DIAGNOSIS — E042 Nontoxic multinodular goiter: Secondary | ICD-10-CM | POA: Diagnosis not present

## 2016-12-13 ENCOUNTER — Ambulatory Visit: Payer: 59 | Admitting: Allergy and Immunology

## 2017-01-27 DIAGNOSIS — G5601 Carpal tunnel syndrome, right upper limb: Secondary | ICD-10-CM | POA: Diagnosis not present

## 2017-02-05 DIAGNOSIS — G5601 Carpal tunnel syndrome, right upper limb: Secondary | ICD-10-CM | POA: Diagnosis not present

## 2017-02-14 DIAGNOSIS — G5601 Carpal tunnel syndrome, right upper limb: Secondary | ICD-10-CM | POA: Diagnosis not present

## 2017-05-15 DIAGNOSIS — R946 Abnormal results of thyroid function studies: Secondary | ICD-10-CM | POA: Diagnosis not present

## 2017-05-15 DIAGNOSIS — E042 Nontoxic multinodular goiter: Secondary | ICD-10-CM | POA: Diagnosis not present

## 2017-05-15 DIAGNOSIS — E041 Nontoxic single thyroid nodule: Secondary | ICD-10-CM | POA: Diagnosis not present

## 2017-06-01 DIAGNOSIS — H2513 Age-related nuclear cataract, bilateral: Secondary | ICD-10-CM | POA: Diagnosis not present

## 2017-06-01 DIAGNOSIS — H5203 Hypermetropia, bilateral: Secondary | ICD-10-CM | POA: Diagnosis not present

## 2017-06-01 DIAGNOSIS — H52203 Unspecified astigmatism, bilateral: Secondary | ICD-10-CM | POA: Diagnosis not present

## 2017-06-01 DIAGNOSIS — H25013 Cortical age-related cataract, bilateral: Secondary | ICD-10-CM | POA: Diagnosis not present

## 2017-06-20 DIAGNOSIS — G5601 Carpal tunnel syndrome, right upper limb: Secondary | ICD-10-CM | POA: Diagnosis not present

## 2017-06-22 DIAGNOSIS — G5601 Carpal tunnel syndrome, right upper limb: Secondary | ICD-10-CM | POA: Diagnosis not present

## 2017-06-22 DIAGNOSIS — Z4789 Encounter for other orthopedic aftercare: Secondary | ICD-10-CM | POA: Diagnosis not present

## 2017-06-22 DIAGNOSIS — Z4800 Encounter for change or removal of nonsurgical wound dressing: Secondary | ICD-10-CM | POA: Diagnosis not present

## 2017-07-06 DIAGNOSIS — Z4789 Encounter for other orthopedic aftercare: Secondary | ICD-10-CM | POA: Diagnosis not present

## 2017-07-06 DIAGNOSIS — G5601 Carpal tunnel syndrome, right upper limb: Secondary | ICD-10-CM | POA: Diagnosis not present

## 2017-07-27 DIAGNOSIS — G5601 Carpal tunnel syndrome, right upper limb: Secondary | ICD-10-CM | POA: Diagnosis not present

## 2017-07-27 DIAGNOSIS — Z4789 Encounter for other orthopedic aftercare: Secondary | ICD-10-CM | POA: Diagnosis not present

## 2017-09-26 DIAGNOSIS — Z23 Encounter for immunization: Secondary | ICD-10-CM | POA: Diagnosis not present

## 2017-11-01 DIAGNOSIS — M5416 Radiculopathy, lumbar region: Secondary | ICD-10-CM | POA: Diagnosis not present

## 2017-11-28 DIAGNOSIS — E042 Nontoxic multinodular goiter: Secondary | ICD-10-CM | POA: Diagnosis not present

## 2017-12-03 DIAGNOSIS — E041 Nontoxic single thyroid nodule: Secondary | ICD-10-CM | POA: Diagnosis not present

## 2017-12-03 DIAGNOSIS — E042 Nontoxic multinodular goiter: Secondary | ICD-10-CM | POA: Diagnosis not present

## 2017-12-03 DIAGNOSIS — R7989 Other specified abnormal findings of blood chemistry: Secondary | ICD-10-CM | POA: Diagnosis not present

## 2018-01-18 DIAGNOSIS — M25562 Pain in left knee: Secondary | ICD-10-CM | POA: Diagnosis not present

## 2018-01-18 DIAGNOSIS — M545 Low back pain: Secondary | ICD-10-CM | POA: Diagnosis not present

## 2018-02-26 DIAGNOSIS — M25562 Pain in left knee: Secondary | ICD-10-CM | POA: Diagnosis not present

## 2018-02-27 DIAGNOSIS — M25462 Effusion, left knee: Secondary | ICD-10-CM | POA: Diagnosis not present

## 2018-02-27 DIAGNOSIS — M23332 Other meniscus derangements, other medial meniscus, left knee: Secondary | ICD-10-CM | POA: Diagnosis not present

## 2018-02-27 DIAGNOSIS — M660 Rupture of popliteal cyst: Secondary | ICD-10-CM | POA: Diagnosis not present

## 2018-02-27 DIAGNOSIS — M76892 Other specified enthesopathies of left lower limb, excluding foot: Secondary | ICD-10-CM | POA: Diagnosis not present

## 2018-02-27 DIAGNOSIS — M7652 Patellar tendinitis, left knee: Secondary | ICD-10-CM | POA: Diagnosis not present

## 2018-02-27 DIAGNOSIS — R600 Localized edema: Secondary | ICD-10-CM | POA: Diagnosis not present

## 2018-02-27 DIAGNOSIS — M222X2 Patellofemoral disorders, left knee: Secondary | ICD-10-CM | POA: Diagnosis not present

## 2018-02-27 DIAGNOSIS — M23322 Other meniscus derangements, posterior horn of medial meniscus, left knee: Secondary | ICD-10-CM | POA: Diagnosis not present

## 2018-03-05 DIAGNOSIS — M25562 Pain in left knee: Secondary | ICD-10-CM | POA: Diagnosis not present

## 2018-05-27 DIAGNOSIS — E041 Nontoxic single thyroid nodule: Secondary | ICD-10-CM | POA: Diagnosis not present

## 2018-05-27 DIAGNOSIS — E042 Nontoxic multinodular goiter: Secondary | ICD-10-CM | POA: Diagnosis not present

## 2018-05-27 DIAGNOSIS — R7989 Other specified abnormal findings of blood chemistry: Secondary | ICD-10-CM | POA: Diagnosis not present

## 2018-06-03 DIAGNOSIS — R7989 Other specified abnormal findings of blood chemistry: Secondary | ICD-10-CM | POA: Diagnosis not present

## 2018-06-03 DIAGNOSIS — E042 Nontoxic multinodular goiter: Secondary | ICD-10-CM | POA: Diagnosis not present

## 2018-06-03 DIAGNOSIS — E041 Nontoxic single thyroid nodule: Secondary | ICD-10-CM | POA: Diagnosis not present

## 2018-06-18 DIAGNOSIS — Z13820 Encounter for screening for osteoporosis: Secondary | ICD-10-CM | POA: Diagnosis not present

## 2018-06-18 DIAGNOSIS — Z0000 Encounter for general adult medical examination without abnormal findings: Secondary | ICD-10-CM | POA: Diagnosis not present

## 2018-06-18 DIAGNOSIS — M25562 Pain in left knee: Secondary | ICD-10-CM | POA: Diagnosis not present

## 2018-06-18 DIAGNOSIS — Z23 Encounter for immunization: Secondary | ICD-10-CM | POA: Diagnosis not present

## 2018-06-18 DIAGNOSIS — Z1389 Encounter for screening for other disorder: Secondary | ICD-10-CM | POA: Diagnosis not present

## 2018-06-18 DIAGNOSIS — E042 Nontoxic multinodular goiter: Secondary | ICD-10-CM | POA: Diagnosis not present

## 2018-06-18 DIAGNOSIS — N3281 Overactive bladder: Secondary | ICD-10-CM | POA: Diagnosis not present

## 2018-06-18 DIAGNOSIS — Z136 Encounter for screening for cardiovascular disorders: Secondary | ICD-10-CM | POA: Diagnosis not present

## 2018-06-18 DIAGNOSIS — G8929 Other chronic pain: Secondary | ICD-10-CM | POA: Diagnosis not present

## 2018-06-21 DIAGNOSIS — H5203 Hypermetropia, bilateral: Secondary | ICD-10-CM | POA: Diagnosis not present

## 2018-06-21 DIAGNOSIS — H2513 Age-related nuclear cataract, bilateral: Secondary | ICD-10-CM | POA: Diagnosis not present

## 2018-06-24 ENCOUNTER — Encounter: Payer: Self-pay | Admitting: Nurse Practitioner

## 2018-06-24 ENCOUNTER — Telehealth: Payer: Self-pay | Admitting: Nurse Practitioner

## 2018-06-24 NOTE — Telephone Encounter (Signed)
New referral received from Dr. Laurann Montana for thrombocytosis. Pt has been scheduled to see Cira Rue on 8/23 at 1:30pm. Letter mailed to the pt.

## 2018-07-12 ENCOUNTER — Inpatient Hospital Stay: Payer: Medicare Other

## 2018-07-12 ENCOUNTER — Telehealth: Payer: Self-pay | Admitting: Hematology

## 2018-07-12 ENCOUNTER — Inpatient Hospital Stay: Payer: Medicare Other | Attending: Nurse Practitioner | Admitting: Nurse Practitioner

## 2018-07-12 ENCOUNTER — Encounter: Payer: Self-pay | Admitting: Nurse Practitioner

## 2018-07-12 VITALS — BP 133/79 | HR 76 | Temp 98.6°F | Resp 18 | Ht 61.52 in | Wt 175.2 lb

## 2018-07-12 DIAGNOSIS — D473 Essential (hemorrhagic) thrombocythemia: Secondary | ICD-10-CM | POA: Diagnosis not present

## 2018-07-12 DIAGNOSIS — K589 Irritable bowel syndrome without diarrhea: Secondary | ICD-10-CM | POA: Insufficient documentation

## 2018-07-12 DIAGNOSIS — Z800 Family history of malignant neoplasm of digestive organs: Secondary | ICD-10-CM | POA: Insufficient documentation

## 2018-07-12 DIAGNOSIS — Z87891 Personal history of nicotine dependence: Secondary | ICD-10-CM | POA: Diagnosis not present

## 2018-07-12 DIAGNOSIS — Z803 Family history of malignant neoplasm of breast: Secondary | ICD-10-CM | POA: Diagnosis not present

## 2018-07-12 DIAGNOSIS — Z9081 Acquired absence of spleen: Secondary | ICD-10-CM | POA: Diagnosis not present

## 2018-07-12 DIAGNOSIS — Z8042 Family history of malignant neoplasm of prostate: Secondary | ICD-10-CM | POA: Insufficient documentation

## 2018-07-12 DIAGNOSIS — D75839 Thrombocytosis, unspecified: Secondary | ICD-10-CM

## 2018-07-12 DIAGNOSIS — Z79899 Other long term (current) drug therapy: Secondary | ICD-10-CM | POA: Diagnosis not present

## 2018-07-12 DIAGNOSIS — M1712 Unilateral primary osteoarthritis, left knee: Secondary | ICD-10-CM | POA: Insufficient documentation

## 2018-07-12 LAB — CBC WITH DIFFERENTIAL (CANCER CENTER ONLY)
Basophils Absolute: 0 10*3/uL (ref 0.0–0.1)
Basophils Relative: 1 %
Eosinophils Absolute: 0.1 10*3/uL (ref 0.0–0.5)
Eosinophils Relative: 1 %
HCT: 41.4 % (ref 34.8–46.6)
Hemoglobin: 13.6 g/dL (ref 11.6–15.9)
Lymphocytes Relative: 35 %
Lymphs Abs: 1.9 10*3/uL (ref 0.9–3.3)
MCH: 29.5 pg (ref 25.1–34.0)
MCHC: 32.9 g/dL (ref 31.5–36.0)
MCV: 89.9 fL (ref 79.5–101.0)
Monocytes Absolute: 0.5 10*3/uL (ref 0.1–0.9)
Monocytes Relative: 9 %
Neutro Abs: 3 10*3/uL (ref 1.5–6.5)
Neutrophils Relative %: 54 %
Platelet Count: 690 10*3/uL — ABNORMAL HIGH (ref 145–400)
RBC: 4.61 MIL/uL (ref 3.70–5.45)
RDW: 14.6 % — ABNORMAL HIGH (ref 11.2–14.5)
WBC Count: 5.5 10*3/uL (ref 3.9–10.3)

## 2018-07-12 NOTE — Telephone Encounter (Signed)
Scheduled appt per 8/23 los - gave patient AVS and calender per los.  

## 2018-07-12 NOTE — Progress Notes (Signed)
[place]  [phone] [phone] [place] consult Note   Patient Care Team: Lavone Orn, MD as PCP - General (Internal Medicine)  Date of Service: 07/12/2018   CHIEF COMPLAINTS/PURPOSE OF CONSULTATION:  Thrombocytosis   HISTORY OF PRESENTING ILLNESS:  Danielle Oconnell 83 y.o. female is here because of progressive thrombocytosis.  She was found to have abnormal CBC from 02/11/15 with platelet count 445K, no other hematologic abnormalities. Subsequent plt count and CBC in 01/2015 was normal. History of irritable bowel syndrome with constipation and small bowel obstruction in 01/2015 requiring surgical intervention, s/p lysis of adhesions per Dr. Johney Maine on 02/12/15. There is a gap in her records with no CBC since 2016; most recent CBC on 06/19/18 with PLT 696K; otherwise normal with Hgb 13.2 and WBC 6.2.   She denies recent illness, frequent infections, autoimmune disease, IBD such as UC or Crohn's, bleeding, iron deficiency, DVT/PE, splenectomy. A sister had thrombocytopenia, she is deceased. She denies alcohol consumption. She smoked cigarettes remotely for 1 year in 2004. 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 She lives alone, is relatively active in the setting of left knee OA, and independent of all ADLs.   She has history of vaginal wall polyp in 2010 and hyperplastic polyp in the rectum in 2011. Repeat colonoscopy in 10/2015 with tubular adenoma. Last mammogram in 2015 showed density category b and negative for malignancy. She is up to date on age-appropriate cancer screenings.  She has strong family history of cancer, including uterine cancer in mother, stomach cancer in brother, prostate cancer in another brother, lymphoma in sister, lymphoma in nephew, breast cancer in niece at age 27, and a daughter diagnosed with breast cancer at age 81. She has another daughter with DM and son who is healthy. She denies anyone in her family had genetic testing.    Today she feels well. Denies fatigue, fever, or chills. She has lost 11 lbs intentionally over 2 years. Has chronic mild non-drenching sweats at night occasionally. Takes daily medication for IBS for constipation, has normal BM without bleeding. Left knee pain related to OA, without recent joint injections. Denies cough, chest pain, dyspnea, or leg swelling.   MEDICAL HISTORY:  Past Medical History:  Diagnosis Date  . Arthritis    knees  . Goiter    not a problem  . History of hiatal hernia   . Hx of seasonal allergies   . Irritable bowel syndrome     SURGICAL HISTORY: Past Surgical History:  Procedure Laterality Date  . ABDOMINAL HYSTERECTOMY    . COLONOSCOPY W/ POLYPECTOMY    . COLONOSCOPY WITH PROPOFOL N/A 11/08/2015   Procedure: COLONOSCOPY WITH PROPOFOL;  Surgeon: Garlan Fair, MD;  Location: [facility];  Service: Endoscopy;  Laterality: N/A;  . LAPAROSCOPIC PARTIAL COLECTOMY N/A 02/12/2015   Procedure: DIAGNOSTIC LAPAROSCOPY WITH LYSIS OF ADHESIONS;  Surgeon: Michael Boston, MD;  Location: [facility];  Service: General;  Laterality: N/A;-"small bowel obstruction"  . LEFT OOPHORECTOMY      SOCIAL HISTORY: Social History   Socioeconomic History  . Marital status: Divorced    Spouse name: Not on file  . Number of children: 3  . Years of education: Not on file  . Highest education level: Not on file  Occupational History  . Occupation: reitred Therapist, sports   Social Needs  . Financial resource strain: Not on file  . Food insecurity:    Worry: Not on file    Inability: Not  on file  . Transportation needs:    Medical: Not on file    Non-medical: Not on file  Tobacco Use  . Smoking status: Former Smoker    Years: 1.00    Last attempt to quit: 10/31/1993    Years since quitting: 24.7  . Smokeless tobacco: Never Used  Substance and Sexual Activity  . Alcohol use: No  . Drug use: No  . Sexual activity: Never  Lifestyle  . Physical activity:    Days per week: Not on file      Minutes per session: Not on file  . Stress: Not on file  Relationships  . Social connections:    Talks on phone: Not on file    Gets together: Not on file    Attends religious service: Not on file    Active member of club or organization: Not on file    Attends meetings of clubs or organizations: Not on file    Relationship status: Not on file  . Intimate partner violence:    Fear of current or ex partner: Not on file    Emotionally abused: Not on file    Physically abused: Not on file    Forced sexual activity: Not on file  Other Topics Concern  . Not on file  Social History Narrative  . Not on file    FAMILY HISTORY: Family History  Problem Relation Age of Onset  . Thrombocytopenia Sister   . Prostate cancer Brother   . Prostate cancer Brother   . Stomach cancer Brother   . Cancer Mother        uterine   . Cancer Sister        lymphoma   . Cancer Other        nephew, lymphoma   . Cancer Other        niece, breast age 52  . Cancer Daughter 74       breast   . Diabetes Daughter   . Allergic rhinitis Neg Hx   . Angioedema Neg Hx   . Asthma Neg Hx   . Atopy Neg Hx   . Eczema Neg Hx   . Immunodeficiency Neg Hx   . Urticaria Neg Hx     ALLERGIES:  is allergic to sulfa antibiotics.  MEDICATIONS:  Current Outpatient Medications  Medication Sig Dispense Refill  . Ascorbic Acid (VITAMIN C) 1000 MG tablet Take 1,000 mg by mouth daily.    . Evening Primrose Oil 500 MG CAPS Take 1 capsule by mouth daily.    . Omega 3 1000 MG CAPS Take 1 capsule by mouth daily.    . Azelastine-Fluticasone 137-50 MCG/ACT SUSP Place 1 spray into the nose daily as needed (allergies). (Patient not taking: Reported on 07/12/2018) 23 g 5  . B Complex Vitamins (B COMPLEX 1 PO) B Complex    . Cranberry 500 MG CAPS Take 500 mg by mouth daily.    Marland Kitchen dextromethorphan-guaiFENesin (MUCINEX DM) 30-600 MG 12hr tablet Take 1 tablet by mouth daily. Reported on 12/14/2015    . DOCOSAHEXAENOIC ACID PO  Take 1 capsule once a day    . Polyethyl Glycol-Propyl Glycol 0.4-0.3 % SOLN Place 1 drop into both eyes 3 (three) times daily as needed (dry eyes). Reported on 12/14/2015    . polyethylene glycol (MIRALAX / GLYCOLAX) packet Take 17 g by mouth daily as needed for mild constipation, moderate constipation or severe constipation.    . Probiotic Product (ALIGN) 4 MG CAPS Take  4 mg by mouth daily.    . sodium chloride (OCEAN) 0.65 % SOLN nasal spray Place 1 spray into both nostrils as needed (allergies). Reported on 12/14/2015    . Vitamin D3 (VITAMIN D) 25 MCG tablet Take 50 mcg by mouth daily.     No current facility-administered medications for this visit.     REVIEW OF SYSTEMS:   Constitutional: Denies fevers, chills (+) periodic mild night sweats (+) intentional weight loss  Eyes: Denies blurriness of vision, double vision or watery eyes Ears, nose, mouth, throat, and face: Denies mucositis or sore throat Respiratory: Denies cough, dyspnea or wheezes Cardiovascular: Denies palpitation, chest discomfort or lower extremity swelling Gastrointestinal:  Denies nausea, vomiting, diarrhea, heartburn, hematochezia, or change in bowel habits (+) IBS, constipation  Skin: Denies abnormal skin rashes Lymphatics: Denies new lymphadenopathy or easy bruising Neurological:Denies numbness, tingling or new weaknesses Behavioral/Psych: Mood is stable, no new changes  MSK: (+) left knee OA  All other systems were reviewed with the patient and are negative.  PHYSICAL EXAMINATION: ECOG PERFORMANCE STATUS: 0 - Asymptomatic  Vitals:   07/12/18 1315  BP: 133/79  Pulse: 76  Resp: 18  Temp: 98.6 F (37 C)  SpO2: 98%   Filed Weights   07/12/18 1315  Weight: 175 lb 3.2 oz (79.5 kg)    GENERAL:alert, no distress and comfortable SKIN: no rashes or significant lesions EYES: sclera clear OROPHARYNX:no thrush or ulcers  LYMPH:  no palpable cervical, supraclavicular, or axillary lymphadenopathy  LUNGS:  clear to auscultation with normal breathing effort HEART: regular rate & rhythm, no lower extremity edema ABDOMEN:abdomen soft, non-tender and normal bowel sounds. No organomegaly  Musculoskeletal:no cyanosis of digits and no clubbing  PSYCH: alert & oriented x 3 with fluent speech NEURO: no focal motor/sensory deficits  LABORATORY DATA:  I have reviewed the data as listed CBC Latest Ref Rng & Units 07/12/2018 02/14/2015 02/13/2015  WBC 3.9 - 10.3 K/uL 5.5 6.4 7.6  Hemoglobin 11.6 - 15.9 g/dL 13.6 12.0 11.9(L)  Hematocrit 34.8 - 46.6 % 41.4 36.1 36.9  Platelets 145 - 400 K/uL 690(H) 340 361    CMP Latest Ref Rng & Units 02/14/2015 02/13/2015 02/12/2015  Glucose 70 - 99 mg/dL 94 88 91  BUN 6 - 23 mg/dL _0 Creatinine 0.50 - 1.10 mg/dL 0.70 0.65 0.76  Sodium 135 - 145 mmol/L 140 140 140  Potassium 3.5 - 5.1 mmol/L 3.5 3.9 3.6  Chloride 96 - 112 mmol/L 106 105 106  CO2 19 - 32 mmol/L _1 Calcium 8.4 - 10.5 mg/dL 8.2(L) 8.6 8.4  Total Protein 6.0 - 8.3 g/dL - - 5.6(L)  Total Bilirubin 0.3 - 1.2 mg/dL - - 0.8  Alkaline Phos 39 - 117 U/L - - 43  AST 0 - 37 U/L - - 17  ALT 0 - 35 U/L - - 11     RADIOGRAPHIC STUDIES: I have personally reviewed the radiological images as listed and agreed with the findings in the report. No results found.  ASSESSMENT & PLAN: 83 year old female with IBS, arthritis, goiter, and elevated platelet count   1. Thrombocytosis  -we reviewed her medical record in detail with the patient. She had remote thrombocytosis in 2016, but has worsened lately to 696K. Dr. Burr Medico discussed potential causes for elevated platelet count such as reactive to infection, malignancy, or iron deficiency.  -today's iron studies hemolyzed, arranging to have her return next week to repeat to r/o iron deficiency.  -  Dr. Burr Medico discussed possible MPN, especially ET. Repeat CBC today confirms thrombocytosis, plt 690K. Will check JAK2, CALR, and MPL gene mutations.  -Dr. Burr Medico also  recommends bone marrow biopsy. She reviewed the procedure and possible risks, the patient agrees to proceed.  -she has no history of thrombosis, but risk is elevated especially if she has ET. Dr. Burr Medico recommends baby aspirin 81 mg daily, she agrees to start.  -Will see her back with results in 4-6 weeks.   2. Family history of cancer  -she has strong family history of cancer, including uterine, stomach, prostate, pancreas, and breast, concerning for inheritable cancer syndrome such as lynch syndrome. -Closest living relative with cancer is her daughter, who was diagnosed with breast cancer at age 56. Dr. Burr Medico recommends she discuss this with her daughter and suggests she get genetic testing.  -If the patient's daughter is tested, then the patient can be tested. She agrees to discuss with her daughter. We can help set her up with our [facility] genetic counselor if needed.   3. Health maintenance, disease prevention  -she continues f/u with PCP, GI   All questions were answered. The patient knows to call the clinic with any problems, questions or concerns. I spent 45 minutes counseling the patient face to face. The total time spent in the appointment was 60 minutes and more than 50% was on counseling.     Alla Feeling, NP 07/14/18   Addendum  I have seen the patient, examined her. I agree with the assessment and and plan and have edited the notes.   Ms Birkhead is a 83 yo female with PMH of goiter, arthritis, irritable bowel syndrome, otherwise healthy and fit, referred for thrombocytosis which was discovered on her routine lab work.  Her platelet count was 696K one month ago, the rest of CBC were normal.  Her CBC was normal in 2016 (except once PLT 425k).  She denies personal history of thrombosis.  We discussed the potential cause of her thrombocytosis, such as reactive thrombocytosis from iron deficiency or malignancy. However she does not appear to have clinical suspicion for reactive  thrombocytosis, and I am concerned this could be essential thrombocythemia. I will repeat her CBC today, and check iron study,  JAK2, MPL, CALR gene mutation test, and set up bone marrow biopsy for her, she is agreeable.    07/12/2018

## 2018-07-14 ENCOUNTER — Encounter: Payer: Self-pay | Admitting: Nurse Practitioner

## 2018-07-16 ENCOUNTER — Telehealth: Payer: Self-pay | Admitting: Nurse Practitioner

## 2018-07-16 ENCOUNTER — Telehealth: Payer: Self-pay | Admitting: Emergency Medicine

## 2018-07-16 NOTE — Telephone Encounter (Addendum)
PT made aware of this note. She will come tomorrow @ 8am for labs. Scheduling message sent    ----- Message from Alla Feeling, NP sent at 07/16/2018 10:46 AM EDT ----- Please call to let her know iron studies hemolyzed. Can she please return this week for repeat lab only, and send schedule message. Thanks, Regan Rakers NP

## 2018-07-16 NOTE — Telephone Encounter (Addendum)
I called the patient to discuss her CBC and recommendation for BM biopsy to rule out MPN such as ET. She agrees. She has lab appt on 8/28 to repeat iron study, but wants to avoid multiple visits. I will see if we can move her lab apt to day of BMbx, and I asked NP Mendel Ryder to schedule. Pt also wants to discuss with financial advocate, I sent message to [facility] to please reach out.  Cira Rue, NP  07/16/18  ----- Message from Truitt Merle, MD sent at 07/16/2018  1:26 PM EDT ----- Regan Rakers,  Could you let her know her CBC result from last Friday? Due to the persistent thrombocytosis, please set up bone marrow biopsy in the next 1-2 weeks, regardless JAK2 mutation result.  I added iron study for next lab draw.   Thanks  Krista Blue

## 2018-07-17 ENCOUNTER — Telehealth: Payer: Self-pay | Admitting: Adult Health

## 2018-07-17 ENCOUNTER — Other Ambulatory Visit: Payer: Medicare Other

## 2018-07-17 NOTE — Telephone Encounter (Signed)
Per 8/28 sch message - pt is aware of bone marrow appt.

## 2018-07-18 ENCOUNTER — Encounter: Payer: Self-pay | Admitting: Hematology

## 2018-07-23 ENCOUNTER — Inpatient Hospital Stay: Payer: Medicare Other

## 2018-07-23 ENCOUNTER — Inpatient Hospital Stay: Payer: Medicare Other | Admitting: Adult Health

## 2018-07-23 ENCOUNTER — Inpatient Hospital Stay: Payer: Medicare Other | Attending: Hematology | Admitting: Adult Health

## 2018-07-23 VITALS — BP 117/69 | HR 64 | Temp 97.5°F | Resp 20

## 2018-07-23 DIAGNOSIS — D473 Essential (hemorrhagic) thrombocythemia: Secondary | ICD-10-CM | POA: Diagnosis not present

## 2018-07-23 DIAGNOSIS — Z79899 Other long term (current) drug therapy: Secondary | ICD-10-CM | POA: Diagnosis not present

## 2018-07-23 DIAGNOSIS — Z8049 Family history of malignant neoplasm of other genital organs: Secondary | ICD-10-CM | POA: Diagnosis not present

## 2018-07-23 DIAGNOSIS — Z803 Family history of malignant neoplasm of breast: Secondary | ICD-10-CM | POA: Insufficient documentation

## 2018-07-23 DIAGNOSIS — R61 Generalized hyperhidrosis: Secondary | ICD-10-CM | POA: Insufficient documentation

## 2018-07-23 DIAGNOSIS — D75839 Thrombocytosis, unspecified: Secondary | ICD-10-CM

## 2018-07-23 DIAGNOSIS — Z87891 Personal history of nicotine dependence: Secondary | ICD-10-CM | POA: Insufficient documentation

## 2018-07-23 DIAGNOSIS — Z7982 Long term (current) use of aspirin: Secondary | ICD-10-CM | POA: Diagnosis not present

## 2018-07-23 DIAGNOSIS — Z800 Family history of malignant neoplasm of digestive organs: Secondary | ICD-10-CM | POA: Diagnosis not present

## 2018-07-23 DIAGNOSIS — M1712 Unilateral primary osteoarthritis, left knee: Secondary | ICD-10-CM | POA: Insufficient documentation

## 2018-07-23 DIAGNOSIS — K589 Irritable bowel syndrome without diarrhea: Secondary | ICD-10-CM | POA: Diagnosis not present

## 2018-07-23 DIAGNOSIS — D7589 Other specified diseases of blood and blood-forming organs: Secondary | ICD-10-CM | POA: Diagnosis not present

## 2018-07-23 LAB — FERRITIN: Ferritin: 99 ng/mL (ref 11–307)

## 2018-07-23 LAB — CBC WITH DIFFERENTIAL (CANCER CENTER ONLY)
Basophils Absolute: 0 10*3/uL (ref 0.0–0.1)
Basophils Relative: 1 %
Eosinophils Absolute: 0.1 10*3/uL (ref 0.0–0.5)
Eosinophils Relative: 2 %
HCT: 42.2 % (ref 34.8–46.6)
Hemoglobin: 12.8 g/dL (ref 11.6–15.9)
Lymphocytes Relative: 37 %
Lymphs Abs: 1.8 10*3/uL (ref 0.9–3.3)
MCH: 29.6 pg (ref 25.1–34.0)
MCHC: 30.3 g/dL — ABNORMAL LOW (ref 31.5–36.0)
MCV: 97.5 fL (ref 79.5–101.0)
Monocytes Absolute: 0.3 10*3/uL (ref 0.1–0.9)
Monocytes Relative: 7 %
Neutro Abs: 2.7 10*3/uL (ref 1.5–6.5)
Neutrophils Relative %: 53 %
Platelet Count: 722 10*3/uL — ABNORMAL HIGH (ref 145–400)
RBC: 4.33 MIL/uL (ref 3.70–5.45)
RDW: 15.2 % — ABNORMAL HIGH (ref 11.2–14.5)
WBC Count: 5 10*3/uL (ref 3.9–10.3)

## 2018-07-23 LAB — IRON AND TIBC
Iron: 101 ug/dL (ref 41–142)
Saturation Ratios: 31 % (ref 21–57)
TIBC: 328 ug/dL (ref 236–444)
UIBC: 226 ug/dL

## 2018-07-23 MED ORDER — LIDOCAINE HCL 2 % IJ SOLN
INTRAMUSCULAR | Status: AC
  Filled 2018-07-23: qty 20

## 2018-07-23 NOTE — Patient Instructions (Signed)
Bone Marrow Aspiration and Bone Marrow Biopsy, Adult, Care After This sheet gives you information about how to care for yourself after your procedure. Your health care provider may also give you more specific instructions. If you have problems or questions, contact your health care provider. What can I expect after the procedure? After the procedure, it is common to have:  Mild pain and tenderness.  Swelling.  Bruising.  Follow these instructions at home:  Take over-the-counter or prescription medicines only as told by your health care provider.  Do not take baths, swim, or use a hot tub until your health care provider approves. Ask if you can take a shower or have a sponge bath.  Follow instructions from your health care provider about how to take care of the puncture site. Make sure you: ? Wash your hands with soap and water before you change your bandage (dressing). If soap and water are not available, use hand sanitizer. ? Change your dressing as told by your health care provider.  Check your puncture siteevery day for signs of infection. Check for: ? More redness, swelling, or pain. ? More fluid or blood. ? Warmth. ? Pus or a bad smell.  Return to your normal activities as told by your health care provider. Ask your health care provider what activities are safe for you.  Do not drive for 24 hours if you were given a medicine to help you relax (sedative).  Keep all follow-up visits as told by your health care provider. This is important. Contact a health care provider if:  You have more redness, swelling, or pain around the puncture site.  You have more fluid or blood coming from the puncture site.  Your puncture site feels warm to the touch.  You have pus or a bad smell coming from the puncture site.  You have a fever.  Your pain is not controlled with medicine. This information is not intended to replace advice given to you by your health care provider. Make sure  you discuss any questions you have with your health care provider. Document Released: 05/26/2005 Document Revised: 05/26/2016 Document Reviewed: 04/19/2016 Elsevier Interactive Patient Education  2018 [employer].

## 2018-07-23 NOTE — Progress Notes (Signed)
INDICATION: thrombocytosis    Bone Marrow Biopsy and Aspiration Procedure Note   Informed consent was obtained and potential risks including bleeding, infection and pain were reviewed with the patient.  The patient's name, date of birth, identification, consent and allergies were verified prior to the start of procedure and time out was performed.  The left posterior iliac crest was chosen as the site of biopsy.  The skin was prepped with ChloraPrep.   16 cc of 2% lidocaine was used to provide local anaesthesia.   8 cc of bone marrow aspirate was obtained followed by 1cm biopsy.  Pressure was applied to the biopsy site and bandage was placed over the biopsy site. Patient was made to lie on the back for 30 mins prior to discharge.  The procedure was tolerated well. COMPLICATIONS: None BLOOD LOSS: none The patient was discharged home in stable condition with follow up to review results.  Patient was provided with post bone marrow biopsy instructions and instructed to call if there was any bleeding or worsening pain.  Specimens sent for flow cytometry, cytogenetics and additional studies.  Signed Scot Dock, NP

## 2018-07-24 NOTE — Progress Notes (Signed)
[facility]  [phone] [phone]  Clinic Follow up Note   Patient Care Team: Lavone Orn, MD as PCP - General (Internal Medicine)   Date of Service:   07/26/2018   CHIEF COMPLAINTS:  Discuss test results    HISTORY OF PRESENTING ILLNESS: 07/12/18 Danielle Oconnell 83 y.o. female is here because of progressive thrombocytosis.  She was found to have abnormal CBC from 02/11/15 with platelet count 445K, no other hematologic abnormalities. Subsequent plt count and CBC in 01/2015 was normal. History of irritable bowel syndrome with constipation and small bowel obstruction in 01/2015 requiring surgical intervention, s/p lysis of adhesions per Dr. Johney Maine on 02/12/15. There is a gap in her records with no CBC since 2016; most recent CBC on 06/19/18 with PLT 696K; otherwise normal with Hgb 13.2 and WBC 6.2.   She denies recent illness, frequent infections, autoimmune disease, IBD such as UC or Crohn's, bleeding, iron deficiency, DVT/PE, splenectomy. A sister had thrombocytopenia, she is deceased. She denies alcohol consumption. She smoked cigarettes remotely for 1 year in 2004. 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 She lives alone, is relatively active in the setting of left knee OA, and independent of all ADLs.   She has history of vaginal wall polyp in 2010 and hyperplastic polyp in the rectum in 2011. Repeat colonoscopy in 10/2015 with tubular adenoma. Last mammogram in 2015 showed density category b and negative for malignancy. She is up to date on age-appropriate cancer screenings.  She has strong family history of cancer, including uterine cancer in mother, stomach cancer in brother, prostate cancer in another brother, lymphoma in sister, lymphoma in nephew, breast cancer in niece at age 58, and a daughter diagnosed with breast cancer at age 53. She has another daughter with DM and son who is healthy. She denies anyone in her family had genetic testing.    Today she feels well. Denies fatigue, fever, or chills. She has lost 11 lbs intentionally over 2 years. Has chronic mild non-drenching sweats at night occasionally. Takes daily medication for IBS for constipation, has normal BM without bleeding. Left knee pain related to OA, without recent joint injections. Denies cough, chest pain, dyspnea, or leg swelling.    CURRENT THERAPY: Hydrea 574m once daily starting 07/29/18   INTERVAL HISTORY:  Danielle RYMERis here for a follow up of her thrombocytosis. She presents to the clinic today accompanied by her daughter. She notes her bone marrow biopsy went well but notes when she bumps into things she will feel ache in her bones. She denies any significant changes since last visit. She notes she had slight weight gain after recent beach trip.  [place]    REVIEW OF SYSTEMS:   Constitutional: Denies fevers, chills or abnormal weight loss (+) Weight gain Eyes: Denies blurriness of vision Ears, nose, mouth, throat, and face: Denies mucositis or sore throat Respiratory: Denies cough, dyspnea or wheezes Cardiovascular: Denies palpitation, chest discomfort or lower extremity swelling Gastrointestinal:  Denies nausea, heartburn or change in bowel habits Skin: Denies abnormal skin rashes Lymphatics: Denies new lymphadenopathy or easy bruising Neurological:Denies numbness, tingling or new weaknesses Behavioral/Psych: Mood is stable, no new changes  All other systems were reviewed with the patient and are negative.  MEDICAL HISTORY:  Past Medical History:  Diagnosis Date  . Arthritis    knees  . Goiter    not a problem  . History of hiatal hernia   .  Hx of seasonal allergies   . Irritable bowel syndrome     SURGICAL HISTORY: Past Surgical History:  Procedure Laterality Date  . ABDOMINAL HYSTERECTOMY    . COLONOSCOPY W/ POLYPECTOMY    . COLONOSCOPY WITH PROPOFOL N/A 11/08/2015   Procedure: COLONOSCOPY  WITH PROPOFOL;  Surgeon: Garlan Fair, MD;  Location: [facility];  Service: Endoscopy;  Laterality: N/A;  . LAPAROSCOPIC PARTIAL COLECTOMY N/A 02/12/2015   Procedure: DIAGNOSTIC LAPAROSCOPY WITH LYSIS OF ADHESIONS;  Surgeon: Michael Boston, MD;  Location: [facility];  Service: General;  Laterality: N/A;-"small bowel obstruction"  . LEFT OOPHORECTOMY      I have reviewed the social history and family history with the patient and they are unchanged from previous note.  ALLERGIES:  is allergic to sulfa antibiotics.  MEDICATIONS:  Current Outpatient Medications  Medication Sig Dispense Refill  . Ascorbic Acid (VITAMIN C) 1000 MG tablet Take 1,000 mg by mouth daily.    Marland Kitchen aspirin EC 81 MG tablet Take 81 mg by mouth daily.    . B Complex Vitamins (B COMPLEX 1 PO) B Complex    . CASCARA SAGRADA PO Take by mouth daily.    . Cranberry 500 MG CAPS Take 500 mg by mouth daily.    . Evening Primrose Oil 500 MG CAPS Take 1 capsule by mouth daily.    . Omega 3 1000 MG CAPS Take 1 capsule by mouth daily.    . Vitamin D3 (VITAMIN D) 25 MCG tablet Take 50 mcg by mouth daily.    . hydroxyurea (HYDREA) 500 MG capsule Take 1 capsule (500 mg total) by mouth daily. May take with food to minimize GI side effects. 30 capsule 0   No current facility-administered medications for this visit.     PHYSICAL EXAMINATION: ECOG PERFORMANCE STATUS: 0 - Asymptomatic  Vitals:   07/26/18 0813  BP: 126/73  Pulse: 67  Resp: 18  Temp: 98.2 F (36.8 C)  SpO2: 99%   Filed Weights    GENERAL:alert, no distress and comfortable SKIN: skin color, texture, turgor are normal, no rashes or significant lesions EYES: normal, Conjunctiva are pink and non-injected, sclera clear OROPHARYNX:no exudate, no erythema and lips, buccal mucosa, and tongue normal  NECK: supple, thyroid normal size, non-tender, without nodularity LYMPH:  no palpable lymphadenopathy in the cervical, axillary or inguinal LUNGS: clear to auscultation and  percussion with normal breathing effort HEART: regular rate & rhythm and no murmurs and no lower extremity edema ABDOMEN:abdomen soft, non-tender and normal bowel sounds Musculoskeletal:no cyanosis of digits and no clubbing  NEURO: alert & oriented x 3 with fluent speech, no focal motor/sensory deficits  LABORATORY DATA:  I have reviewed the data as listed CBC Latest Ref Rng & Units 07/23/2018 07/12/2018 02/14/2015  WBC 3.9 - 10.3 K/uL 5.0 5.5 6.4  Hemoglobin 11.6 - 15.9 g/dL 12.8 13.6 12.0  Hematocrit 34.8 - 46.6 % 42.2 41.4 36.1  Platelets 145 - 400 K/uL 722(H) 690(H) 340     CMP Latest Ref Rng & Units 02/14/2015 02/13/2015 02/12/2015  Glucose 70 - 99 mg/dL 94 88 91  BUN 6 - 23 mg/dL _0 Creatinine 0.50 - 1.10 mg/dL 0.70 0.65 0.76  Sodium 135 - 145 mmol/L 140 140 140  Potassium 3.5 - 5.1 mmol/L 3.5 3.9 3.6  Chloride 96 - 112 mmol/L 106 105 106  CO2 19 - 32 mmol/L _1 Calcium 8.4 - 10.5 mg/dL 8.2(L) 8.6 8.4  Total Protein 6.0 - 8.3  g/dL - - 5.6(L)  Total Bilirubin 0.3 - 1.2 mg/dL - - 0.8  Alkaline Phos 39 - 117 U/L - - 43  AST 0 - 37 U/L - - 17  ALT 0 - 35 U/L - - 11    PATHOLOGY  Diagnosis 07/23/18 Bone Marrow, Aspirate,Biopsy, and Clot - HYPERCELLULAR BONE MARROW FOR AGE WITH MEGAKARYOCYTIC PROLIFERATION. - SEVERAL LYMPHOID AGGREGATES PRESENT. - SEE COMMENT. PERIPHERAL BLOOD: - THROMBOCYTOSIS. Diagnosis Note The bone marrow is hypercellular for age including increased number of megakaryocytes with many atypical and large forms. No significant dyspoiesis is seen in other cell lines and no increase in blastic cells is identified. In the presence of long-standing thrombocytosis, the features are very concerning for a myeloproliferative neoplasm, particularly essential thrombocythemia. Correlation with cytogenetic and molecular (JAK2,etc) studies is recommended. The bone marrow also displays several lymphoid aggregates of primarily small lymphoid cells admixed with  scattering of larger lymphoid cells with features of prolymphocytes/paraimmunoblasts. No peripheral blood lymphocytosis is seen. Flow cytometric analysis and immunohistochemical stains were performed and failed to show any definitive phenotypic abnormalities. Nonetheless, based on the morphological assessment, an early lymphoproliferative process such as small lymphocytic lymphoma/chronic lymphocytic leukemia is not excluded. Clinical follow up is recommended. (BNS:ecj 07/24/2018) Interpretation Bone Marrow Flow Cytometry - NO MONOCLONAL B-CELL POPULATION OR ABNORMAL T-CELL PHENOTYPE IDENTIFIED. Susanne Greenhouse MD Pathologist, Electronic Signature (Case signed 07/25/2018) Microscopic Gated population: Flow cytometric immunophenotyping is performed using antibiodies to the antigens listed in the table below. Electronic gates are placed around a cell cluster displaying light scatter properties corresponding to lymphocytes. - Abnormal Cells in gated population: N/A - Phenotype of Abnormal Cells: N/A      RADIOGRAPHIC STUDIES: I have personally reviewed the radiological images as listed and agreed with the findings in the report. No results found.   ASSESSMENT & PLAN:  Danielle Oconnell is a 83 y.o. female with a history of IBS, arthritis, goiter, and elevated platelet count.    1. Essential Thrombocythemia, JAK2(+) -I previously reviewed her medical record in detail with the patient. She had remote thrombocytosis in 2016, but has worsened lately to 696K.  -We previously discussed the potential cause of her thrombocytosis, such as reactive thrombocytosis from iron deficiency or malignancy. However she does not appear to have clinical suspicion for reactive thrombocytosis, and I am concerned this could be essential thrombocythemia.  -This week's labs showed, iron panel and Hg WNL, her PLT elevated at 722K.  -Her 07/23/18 Bone Marrow biopsy showed hypocellular bone marrow, with megakaryocyte  proliferation and her  genetic mutation testing results showed JAK2(+), this is consistent with essential thrombocythemia. -I discussed the natural story of ET, most pts will have a benign course, but at increases risk for blood clots, heart attack and stroke. I discussed this is not curable but very treatable.  There is a small risk of evolving to myelofibrosis or leukemia in the future.  The goal of therapy is to maintain her platelet count in the normal range, reduce her risk for thrombosis. -Based on her age, I recommend her to start treatment, we discussed the option of Hydrea and Anagrelide. I discussed  side effects of Hydrea includes fatigue, low blood counts, and increased risk of developed MDS or Leukemia after long term and  use and Anagrelide increased risk of heart disease.  I discussed the side effect of anagrelide, especially increased risk of heart disease.  I recommend starting with Hydrea. I provided her with reading material on this medication. She is  agreed to try. Plan to start 07/29/18 once daily. -Continue baby aspirin 24m daily  -Labs in 2 week, f/u in 4 weeks    2. Family history of cancer  -she has strong family history of cancer, including uterine, stomach, prostate, pancreas, and breast, concerning for inheritable cancer syndrome such as lynch syndrome. -Closest living relative with cancer is her daughter, who was diagnosed with breast cancer at age 81 I previoulsy recommended she discuss this with her daughter and suggests she get genetic testing.  -If the patient's daughter is tested, then the patient can be tested. She agreed to discuss with her daughter. [facility]   3. Health maintenance, disease prevention  -she continues f/u with PCP, GI   PLAN:   -Prescribe Hydrea today to [employer] she will start 5065mdaily -Continue baby aspirin 8187maily  -Lab in 2 and 4 weeks  -F/u in 4 weeks     No  problem-specific Assessment & Plan notes found for this encounter.   No orders of the defined types were placed in this encounter.  All questions were answered. The patient knows to call the clinic with any problems, questions or concerns. No barriers to learning was detected. I spent 25 minutes counseling the patient face to face. The total time spent in the appointment was 30 minutes and more than 50% was on counseling and review of test results     YanTruitt MerleD 07/26/2018 9:01 AM   I, AmoJoslyn Devonm acting as scribe for YanTruitt MerleD.   I have reviewed the above documentation for accuracy and completeness, and I agree with the above.

## 2018-07-25 DIAGNOSIS — D473 Essential (hemorrhagic) thrombocythemia: Secondary | ICD-10-CM | POA: Insufficient documentation

## 2018-07-26 ENCOUNTER — Inpatient Hospital Stay (HOSPITAL_BASED_OUTPATIENT_CLINIC_OR_DEPARTMENT_OTHER): Payer: Medicare Other | Admitting: Hematology

## 2018-07-26 ENCOUNTER — Encounter: Payer: Self-pay | Admitting: Hematology

## 2018-07-26 DIAGNOSIS — M1712 Unilateral primary osteoarthritis, left knee: Secondary | ICD-10-CM | POA: Diagnosis not present

## 2018-07-26 DIAGNOSIS — Z87891 Personal history of nicotine dependence: Secondary | ICD-10-CM

## 2018-07-26 DIAGNOSIS — Z800 Family history of malignant neoplasm of digestive organs: Secondary | ICD-10-CM

## 2018-07-26 DIAGNOSIS — Z803 Family history of malignant neoplasm of breast: Secondary | ICD-10-CM

## 2018-07-26 DIAGNOSIS — Z79899 Other long term (current) drug therapy: Secondary | ICD-10-CM

## 2018-07-26 DIAGNOSIS — D473 Essential (hemorrhagic) thrombocythemia: Secondary | ICD-10-CM

## 2018-07-26 DIAGNOSIS — R61 Generalized hyperhidrosis: Secondary | ICD-10-CM

## 2018-07-26 DIAGNOSIS — Z8049 Family history of malignant neoplasm of other genital organs: Secondary | ICD-10-CM

## 2018-07-26 DIAGNOSIS — Z7982 Long term (current) use of aspirin: Secondary | ICD-10-CM

## 2018-07-26 DIAGNOSIS — K589 Irritable bowel syndrome without diarrhea: Secondary | ICD-10-CM | POA: Diagnosis not present

## 2018-07-26 MED ORDER — HYDROXYUREA 500 MG PO CAPS: 500.0000 mg | ORAL_CAPSULE | Freq: Every day | ORAL | 0 refills | Status: DC

## 2018-07-26 MED FILL — HYDROXYUREA 500 MG CAPSULE: 500 | 30 days supply | Qty: 30 | Fill #0

## 2018-08-02 LAB — JAK2 (INCLUDING V617F AND EXON 12), MPL,& CALR-NEXT GEN SEQ

## 2018-08-06 ENCOUNTER — Encounter (HOSPITAL_COMMUNITY): Payer: Self-pay | Admitting: Hematology

## 2018-08-09 ENCOUNTER — Telehealth: Payer: Self-pay | Admitting: Emergency Medicine

## 2018-08-09 ENCOUNTER — Inpatient Hospital Stay: Payer: Medicare Other

## 2018-08-09 DIAGNOSIS — D75839 Thrombocytosis, unspecified: Secondary | ICD-10-CM

## 2018-08-09 DIAGNOSIS — Z79899 Other long term (current) drug therapy: Secondary | ICD-10-CM | POA: Diagnosis not present

## 2018-08-09 DIAGNOSIS — D473 Essential (hemorrhagic) thrombocythemia: Secondary | ICD-10-CM

## 2018-08-09 DIAGNOSIS — R61 Generalized hyperhidrosis: Secondary | ICD-10-CM | POA: Diagnosis not present

## 2018-08-09 DIAGNOSIS — K589 Irritable bowel syndrome without diarrhea: Secondary | ICD-10-CM | POA: Diagnosis not present

## 2018-08-09 DIAGNOSIS — M1712 Unilateral primary osteoarthritis, left knee: Secondary | ICD-10-CM | POA: Diagnosis not present

## 2018-08-09 DIAGNOSIS — Z7982 Long term (current) use of aspirin: Secondary | ICD-10-CM | POA: Diagnosis not present

## 2018-08-09 LAB — CBC WITH DIFFERENTIAL (CANCER CENTER ONLY)
Basophils Absolute: 0 10*3/uL (ref 0.0–0.1)
Basophils Relative: 1 %
Eosinophils Absolute: 0.1 10*3/uL (ref 0.0–0.5)
Eosinophils Relative: 2 %
HCT: 41.8 % (ref 34.8–46.6)
Hemoglobin: 13.5 g/dL (ref 11.6–15.9)
Lymphocytes Relative: 39 %
Lymphs Abs: 2.1 10*3/uL (ref 0.9–3.3)
MCH: 29.9 pg (ref 25.1–34.0)
MCHC: 32.3 g/dL (ref 31.5–36.0)
MCV: 92.7 fL (ref 79.5–101.0)
Monocytes Absolute: 0.5 10*3/uL (ref 0.1–0.9)
Monocytes Relative: 9 %
Neutro Abs: 2.7 10*3/uL (ref 1.5–6.5)
Neutrophils Relative %: 49 %
Platelet Count: 684 10*3/uL — ABNORMAL HIGH (ref 145–400)
RBC: 4.51 MIL/uL (ref 3.70–5.45)
RDW: 15.5 % — ABNORMAL HIGH (ref 11.2–14.5)
WBC Count: 5.5 10*3/uL (ref 3.9–10.3)

## 2018-08-09 NOTE — Telephone Encounter (Addendum)
PT verbalized understanding    ----- Message from Jonnie Finner, RN sent at 08/09/2018 12:18 PM EDT -----   ----- Message ----- From: Alla Feeling, NP Sent: 08/09/2018   9:49 AM EDT To: Arl[facility]t [facility]  Please let her know platelet count remains elevated but slightly improved. Please continue hydrea 500 mg daily and keep next lab/appts.  Thanks, Regan Rakers NP

## 2018-08-14 ENCOUNTER — Ambulatory Visit: Payer: Medicare Other | Admitting: Hematology

## 2018-08-20 NOTE — Progress Notes (Signed)
[place]  [phone]) [phone] [phone]  Clinic Follow up Note   Patient Care Team: Lavone Orn, MD as PCP - General (Internal Medicine)   Date of Service:   08/23/2018   CHIEF COMPLAINTS:  Follow up for Essential Thrombocytosis, JAK2+  HISTORY OF PRESENTING ILLNESS: 07/12/18 Danielle Oconnell 83 y.o. female is here because of progressive thrombocytosis.  She was found to have abnormal CBC from 02/11/15 with platelet count 445K, no other hematologic abnormalities. Subsequent plt count and CBC in 01/2015 was normal. History of irritable bowel syndrome with constipation and small bowel obstruction in 01/2015 requiring surgical intervention, s/p lysis of adhesions per Dr. Johney Maine on 02/12/15. There is a gap in her records with no CBC since 2016; most recent CBC on 06/19/18 with PLT 696K; otherwise normal with Hgb 13.2 and WBC 6.2.   She denies recent illness, frequent infections, autoimmune disease, IBD such as UC or Crohn's, bleeding, iron deficiency, DVT/PE, splenectomy. A sister had thrombocytopenia, she is deceased. She denies alcohol consumption. She smoked cigarettes remotely for 1 year in 2004. 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 She lives alone, is relatively active in the setting of left knee OA, and independent of all ADLs.   She has history of vaginal wall polyp in 2010 and hyperplastic polyp in the rectum in 2011. Repeat colonoscopy in 10/2015 with tubular adenoma. Last mammogram in 2015 showed density category b and negative for malignancy. She is up to date on age-appropriate cancer screenings.  She has strong family history of cancer, including uterine cancer in mother, stomach cancer in brother, prostate cancer in another brother, lymphoma in sister, lymphoma in nephew, breast cancer in niece at age 52, and a daughter diagnosed with breast cancer at age 85. She has another daughter with DM and son who is healthy. She denies anyone in her family  had genetic testing.   Today she feels well. Denies fatigue, fever, or chills. She has lost 11 lbs intentionally over 2 years. Has chronic mild non-drenching sweats at night occasionally. Takes daily medication for IBS for constipation, has normal BM without bleeding. Left knee pain related to OA, without recent joint injections. Denies cough, chest pain, dyspnea, or leg swelling.    CURRENT THERAPY: Hydrea 538m once daily starting 07/29/18, will ncrease to 10075mon Mondays and Fridays and 50082mhe rest of the week on 08/24/2018.    INTERVAL HISTORY:  Danielle Oconnell here for a follow up of her Essential Thrombocytosis. She presents to the clinic today accompanied by herself. She has started Hydrea and has no complaints or side effects from medication. Her copay is $25 and she is able to handle that.  She notes her blood pressure had systolic blood pressure in the 40s and will continue to monitor this. She is interested in flu shot this year.    REVIEW OF SYSTEMS:   Constitutional: Denies fevers, chills or abnormal weight loss Eyes: Denies blurriness of vision Ears, nose, mouth, throat, and face: Denies mucositis or sore throat Respiratory: Denies cough, dyspnea or wheezes Cardiovascular: Denies palpitation, chest discomfort or lower extremity swelling Gastrointestinal:  Denies nausea, heartburn or change in bowel habits Skin: Denies abnormal skin rashes Lymphatics: Denies new lymphadenopathy or easy bruising Neurological:Denies numbness, tingling or new weaknesses Behavioral/Psych: Mood is stable, no new changes  All other systems were reviewed with the patient and are negative.  MEDICAL HISTORY:  Past Medical History:  Diagnosis Date  . Arthritis  knees  . Goiter    not a problem  . History of hiatal hernia   . Hx of seasonal allergies   . Irritable bowel syndrome     SURGICAL HISTORY: Past Surgical History:  Procedure Laterality Date  . ABDOMINAL HYSTERECTOMY    .  COLONOSCOPY W/ POLYPECTOMY    . COLONOSCOPY WITH PROPOFOL N/A 11/08/2015   Procedure: COLONOSCOPY WITH PROPOFOL;  Surgeon: Garlan Fair, MD;  Location: [facility];  Service: Endoscopy;  Laterality: N/A;  . LAPAROSCOPIC PARTIAL COLECTOMY N/A 02/12/2015   Procedure: DIAGNOSTIC LAPAROSCOPY WITH LYSIS OF ADHESIONS;  Surgeon: Michael Boston, MD;  Location: [facility];  Service: General;  Laterality: N/A;-"small bowel obstruction"  . LEFT OOPHORECTOMY      I have reviewed the social history and family history with the patient and they are unchanged from previous note.  ALLERGIES:  is allergic to sulfa antibiotics.  MEDICATIONS:  Current Outpatient Medications  Medication Sig Dispense Refill  . Ascorbic Acid (VITAMIN C) 1000 MG tablet Take 1,000 mg by mouth daily.    Marland Kitchen aspirin EC 81 MG tablet Take 81 mg by mouth daily.    . B Complex Vitamins (B COMPLEX 1 PO) B Complex    . CASCARA SAGRADA PO Take by mouth daily.    . Cranberry 500 MG CAPS Take 500 mg by mouth daily.    . Evening Primrose Oil 500 MG CAPS Take 1 capsule by mouth daily.    . hydroxyurea (HYDREA) 500 MG capsule Take 1 capsule (500 mg total) by mouth daily. May take with food to minimize GI side effects. 30 capsule 0  . Omega 3 1000 MG CAPS Take 1 capsule by mouth daily.    . Vitamin D3 (VITAMIN D) 25 MCG tablet Take 50 mcg by mouth daily.     No current facility-administered medications for this visit.     PHYSICAL EXAMINATION: ECOG PERFORMANCE STATUS: 0 - Asymptomatic  Vitals:   08/23/18 0849  BP: (!) 143/73  Pulse: 66  Resp: 17  Temp: 97.8 F (36.6 C)  SpO2: 100%   Filed Weights   08/23/18 0849  Weight: 177 lb 14.4 oz (80.7 kg)    GENERAL:alert, no distress and comfortable SKIN: skin color, texture, turgor are normal, no rashes or significant lesions EYES: normal, Conjunctiva are pink and non-injected, sclera clear OROPHARYNX:no exudate, no erythema and lips, buccal mucosa, and tongue normal  NECK: supple,  thyroid normal size, non-tender, without nodularity LYMPH:  no palpable lymphadenopathy in the cervical, axillary or inguinal LUNGS: clear to auscultation and percussion with normal breathing effort HEART: regular rate & rhythm and no murmurs and no lower extremity edema ABDOMEN:abdomen soft, non-tender and normal bowel sounds Musculoskeletal:no cyanosis of digits and no clubbing  NEURO: alert & oriented x 3 with fluent speech, no focal motor/sensory deficits  LABORATORY DATA:  I have reviewed the data as listed CBC Latest Ref Rng & Units 08/23/2018 08/09/2018 07/23/2018  WBC 3.9 - 10.3 K/uL 4.9 5.5 5.0  Hemoglobin 11.6 - 15.9 g/dL 13.4 13.5 12.8  Hematocrit 34.8 - 46.6 % 40.3 41.8 42.2  Platelets 145 - 400 K/uL 574(H) 684(H) 722(H)     CMP Latest Ref Rng & Units 02/14/2015 02/13/2015 02/12/2015  Glucose 70 - 99 mg/dL 94 88 91  BUN 6 - 23 mg/dL 8 11 12   Creatinine 0.50 - 1.10 mg/dL 0.70 0.65 0.76  Sodium 135 - 145 mmol/L 140 140 140  Potassium 3.5 - 5.1 mmol/L 3.5 3.9 3.6  Chloride 96 - 112 mmol/L 106 105 106  CO2 19 - 32 mmol/L 28 25 27   Calcium 8.4 - 10.5 mg/dL 8.2(L) 8.6 8.4  Total Protein 6.0 - 8.3 g/dL - - 5.6(L)  Total Bilirubin 0.3 - 1.2 mg/dL - - 0.8  Alkaline Phos 39 - 117 U/L - - 43  AST 0 - 37 U/L - - 17  ALT 0 - 35 U/L - - 11    PATHOLOGY  Diagnosis 07/23/18 Bone Marrow, Aspirate,Biopsy, and Clot - HYPERCELLULAR BONE MARROW FOR AGE WITH MEGAKARYOCYTIC PROLIFERATION. - SEVERAL LYMPHOID AGGREGATES PRESENT. - SEE COMMENT. PERIPHERAL BLOOD: - THROMBOCYTOSIS. Diagnosis Note The bone marrow is hypercellular for age including increased number of megakaryocytes with many atypical and large forms. No significant dyspoiesis is seen in other cell lines and no increase in blastic cells is identified. In the presence of long-standing thrombocytosis, the features are very concerning for a myeloproliferative neoplasm, particularly essential thrombocythemia. Correlation with  cytogenetic and molecular (JAK2,etc) studies is recommended. The bone marrow also displays several lymphoid aggregates of primarily small lymphoid cells admixed with scattering of larger lymphoid cells with features of prolymphocytes/paraimmunoblasts. No peripheral blood lymphocytosis is seen. Flow cytometric analysis and immunohistochemical stains were performed and failed to show any definitive phenotypic abnormalities. Nonetheless, based on the morphological assessment, an early lymphoproliferative process such as small lymphocytic lymphoma/chronic lymphocytic leukemia is not excluded. Clinical follow up is recommended. (BNS:ecj 07/24/2018) Interpretation Bone Marrow Flow Cytometry - NO MONOCLONAL B-CELL POPULATION OR ABNORMAL T-CELL PHENOTYPE IDENTIFIED. Susanne Greenhouse MD Pathologist, Electronic Signature (Case signed 07/25/2018) Microscopic Gated population: Flow cytometric immunophenotyping is performed using antibiodies to the antigens listed in the table below. Electronic gates are placed around a cell cluster displaying light scatter properties corresponding to lymphocytes. - Abnormal Cells in gated population: N/A - Phenotype of Abnormal Cells: N/A      RADIOGRAPHIC STUDIES: I have personally reviewed the radiological images as listed and agreed with the findings in the report. No results found.   ASSESSMENT & PLAN:  Danielle Oconnell is a 83 y.o. female with a history of IBS, arthritis, goiter, and elevated platelet count.    1. Essential Thrombocythemia, JAK2(+) -I previously reviewed her medical record in detail with the patient. She had remote thrombocytosis in 2016, but has worsened lately to 696K.  -We previously discussed the potential cause of her thrombocytosis, such as reactive thrombocytosis from iron deficiency or malignancy. However she does not appear to have clinical suspicion for reactive thrombocytosis, and I am concerned this could be essential thrombocythemia.   -07/23/18 labs showed, iron panel and Hg WNL, her PLT elevated 722K.  -Her 07/23/18 Bone Marrow biopsy showed hypocellular bone marrow, with megakaryocyte proliferation and her  genetic mutation testing results showed JAK2(+), this is consistent with essential thrombocythemia. -I previously discussed the natural story of ET, most pts will have a benign course, but at increases risk for blood clots, heart attack and stroke. I discussed this is not curable but very treatable. There is a small risk of evolving to myelofibrosis or leukemia in the future. The goal of therapy is to maintain her platelet count in the normal range, reduce her risk for thrombosis. -Based on her age, I previously recommended her to start treatment, we discussed the option of Hydrea and Anagrelide. I discussed side effects of both mediations in great detail. I recommended starting with Hydrea. I provided her with reading material on this medication. She is agreed to try.  -She started Hydrea 526m  daily on 07/29/18 once daily. She has tolerated well so far with no significant side effects.  -Labs reviewed, PLT at 574K -Her thrombocytosis has not been adequately controlled.  I will increase her Hydrea to 1019m on Mondays and Fridays and 5022mthe rest of the week. I will refill today. -Continue baby aspirin 8175maily  -She is interested in flu shot this year, will proceed with flu shot today.    2. Family history of cancer  -She has strong family history of cancer, including uterine, stomach, prostate, pancreas, and breast, concerning for inheritable cancer syndrome such as lynch syndrome. -Closest living relative with cancer is her daughter, who was diagnosed with breast cancer at age 54.69 previously recommended she discuss this with her daughter and suggests she get genetic testing.  -If the patient's daughter is tested, then the patient can be tested. She agreed to discuss with her daughter. [facility].   3. Health maintenance, disease prevention  -she continues f/u with PCP, GI   PLAN:   -Flu shot today  -Labs reviewed, PLT at 574K, Increase Hydrea to 1000m84m Mondays and Fridays and 500mg62m rest of the week, refilled today  -Continue baby aspirin 81mg 78my  -lab and f/u in one month      No problem-specific Assessment & Plan notes found for this encounter.   No orders of the defined types were placed in this encounter.  All questions were answered. The patient knows to call the clinic with any problems, questions or concerns. No barriers to learning was detected. I spent 15 minutes counseling the patient face to face. The total time spent in the appointment was 20 minutes and more than 50% was on counseling and review of test results     Niel Peretti FeTruitt Merle0/02/2018 9:01 AM   I, Amoya Joslyn Devoncting as scribe for Lidwina Kaner FeTruitt Merle  I have reviewed the above documentation for accuracy and completeness, and I agree with the above.

## 2018-08-23 ENCOUNTER — Inpatient Hospital Stay (HOSPITAL_BASED_OUTPATIENT_CLINIC_OR_DEPARTMENT_OTHER): Payer: Medicare Other | Admitting: Hematology

## 2018-08-23 ENCOUNTER — Telehealth: Payer: Self-pay

## 2018-08-23 ENCOUNTER — Inpatient Hospital Stay: Payer: Medicare Other | Attending: Hematology

## 2018-08-23 ENCOUNTER — Other Ambulatory Visit: Payer: Self-pay | Admitting: Hematology

## 2018-08-23 VITALS — BP 143/73 | HR 66 | Temp 97.8°F | Resp 17 | Ht 61.52 in | Wt 177.9 lb

## 2018-08-23 DIAGNOSIS — Z803 Family history of malignant neoplasm of breast: Secondary | ICD-10-CM | POA: Diagnosis not present

## 2018-08-23 DIAGNOSIS — D473 Essential (hemorrhagic) thrombocythemia: Secondary | ICD-10-CM

## 2018-08-23 DIAGNOSIS — K589 Irritable bowel syndrome without diarrhea: Secondary | ICD-10-CM | POA: Insufficient documentation

## 2018-08-23 DIAGNOSIS — Z7982 Long term (current) use of aspirin: Secondary | ICD-10-CM | POA: Diagnosis not present

## 2018-08-23 DIAGNOSIS — Z79899 Other long term (current) drug therapy: Secondary | ICD-10-CM | POA: Insufficient documentation

## 2018-08-23 DIAGNOSIS — M1712 Unilateral primary osteoarthritis, left knee: Secondary | ICD-10-CM | POA: Diagnosis not present

## 2018-08-23 DIAGNOSIS — Z8042 Family history of malignant neoplasm of prostate: Secondary | ICD-10-CM | POA: Diagnosis not present

## 2018-08-23 DIAGNOSIS — R7989 Other specified abnormal findings of blood chemistry: Secondary | ICD-10-CM | POA: Diagnosis not present

## 2018-08-23 DIAGNOSIS — Z8049 Family history of malignant neoplasm of other genital organs: Secondary | ICD-10-CM

## 2018-08-23 DIAGNOSIS — Z800 Family history of malignant neoplasm of digestive organs: Secondary | ICD-10-CM | POA: Insufficient documentation

## 2018-08-23 DIAGNOSIS — Z23 Encounter for immunization: Secondary | ICD-10-CM | POA: Diagnosis not present

## 2018-08-23 DIAGNOSIS — D75839 Thrombocytosis, unspecified: Secondary | ICD-10-CM

## 2018-08-23 LAB — CBC WITH DIFFERENTIAL (CANCER CENTER ONLY)
Basophils Absolute: 0.1 10*3/uL (ref 0.0–0.1)
Basophils Relative: 1 %
Eosinophils Absolute: 0.1 10*3/uL (ref 0.0–0.5)
Eosinophils Relative: 2 %
HCT: 40.3 % (ref 34.8–46.6)
Hemoglobin: 13.4 g/dL (ref 11.6–15.9)
Lymphocytes Relative: 39 %
Lymphs Abs: 1.9 10*3/uL (ref 0.9–3.3)
MCH: 30.5 pg (ref 25.1–34.0)
MCHC: 33.2 g/dL (ref 31.5–36.0)
MCV: 91.6 fL (ref 79.5–101.0)
Monocytes Absolute: 0.4 10*3/uL (ref 0.1–0.9)
Monocytes Relative: 8 %
Neutro Abs: 2.5 10*3/uL (ref 1.5–6.5)
Neutrophils Relative %: 50 %
Platelet Count: 574 10*3/uL — ABNORMAL HIGH (ref 145–400)
RBC: 4.39 MIL/uL (ref 3.70–5.45)
RDW: 16.5 % — ABNORMAL HIGH (ref 11.2–14.5)
WBC Count: 4.9 10*3/uL (ref 3.9–10.3)

## 2018-08-23 LAB — CMP (CANCER CENTER ONLY)
ALT: 15 U/L (ref 0–44)
AST: 19 U/L (ref 15–41)
Albumin: 4 g/dL (ref 3.5–5.0)
Alkaline Phosphatase: 67 U/L (ref 38–126)
Anion gap: 9 (ref 5–15)
BUN: 14 mg/dL (ref 8–23)
CO2: 28 mmol/L (ref 22–32)
Calcium: 9.7 mg/dL (ref 8.9–10.3)
Chloride: 107 mmol/L (ref 98–111)
Creatinine: 0.81 mg/dL (ref 0.44–1.00)
GFR, Est AFR Am: >60 mL/min (ref >60)
GFR, Estimated: >60 mL/min (ref >60)
Glucose, Bld: 98 mg/dL (ref 70–99)
Potassium: 4.1 mmol/L (ref 3.5–5.1)
Sodium: 144 mmol/L (ref 135–145)
Total Bilirubin: 0.5 mg/dL (ref 0.3–1.2)
Total Protein: 7 g/dL (ref 6.5–8.1)

## 2018-08-23 MED ORDER — INFLUENZA VAC SPLIT QUAD 0.5 ML IM SUSY
0.5000 mL | PREFILLED_SYRINGE | Freq: Once | INTRAMUSCULAR | Status: AC
  Administered 2018-08-23: 0.5 mL via INTRAMUSCULAR

## 2018-08-23 MED ORDER — INFLUENZA VAC SPLIT QUAD 0.5 ML IM SUSY
PREFILLED_SYRINGE | INTRAMUSCULAR | Status: AC
  Filled 2018-08-23: qty 0.5

## 2018-08-23 MED ORDER — HYDROXYUREA 500 MG PO CAPS: 500.0000 mg | ORAL_CAPSULE | Freq: Every day | ORAL | 0 refills | Status: DC

## 2018-08-23 MED FILL — HYDROXYUREA 500 MG CAPSULE: 500 | 31 days supply | Qty: 40 | Fill #0

## 2018-08-23 NOTE — Telephone Encounter (Signed)
Printed avs and calender of upcoming appointment. Per 10/4 los 

## 2018-08-24 ENCOUNTER — Encounter: Payer: Self-pay | Admitting: Hematology

## 2018-09-19 ENCOUNTER — Encounter: Payer: Self-pay | Admitting: Hematology

## 2018-09-19 ENCOUNTER — Inpatient Hospital Stay (HOSPITAL_BASED_OUTPATIENT_CLINIC_OR_DEPARTMENT_OTHER): Payer: Medicare Other | Admitting: Hematology

## 2018-09-19 ENCOUNTER — Inpatient Hospital Stay: Payer: Medicare Other

## 2018-09-19 ENCOUNTER — Telehealth: Payer: Self-pay | Admitting: Hematology

## 2018-09-19 VITALS — BP 132/75 | HR 72 | Temp 97.9°F | Resp 18 | Ht 61.52 in | Wt 178.9 lb

## 2018-09-19 DIAGNOSIS — Z8042 Family history of malignant neoplasm of prostate: Secondary | ICD-10-CM

## 2018-09-19 DIAGNOSIS — M1712 Unilateral primary osteoarthritis, left knee: Secondary | ICD-10-CM

## 2018-09-19 DIAGNOSIS — K589 Irritable bowel syndrome without diarrhea: Secondary | ICD-10-CM

## 2018-09-19 DIAGNOSIS — Z803 Family history of malignant neoplasm of breast: Secondary | ICD-10-CM

## 2018-09-19 DIAGNOSIS — D75839 Thrombocytosis, unspecified: Secondary | ICD-10-CM

## 2018-09-19 DIAGNOSIS — Z800 Family history of malignant neoplasm of digestive organs: Secondary | ICD-10-CM | POA: Diagnosis not present

## 2018-09-19 DIAGNOSIS — Z8049 Family history of malignant neoplasm of other genital organs: Secondary | ICD-10-CM

## 2018-09-19 DIAGNOSIS — Z7982 Long term (current) use of aspirin: Secondary | ICD-10-CM | POA: Diagnosis not present

## 2018-09-19 DIAGNOSIS — R7989 Other specified abnormal findings of blood chemistry: Secondary | ICD-10-CM

## 2018-09-19 DIAGNOSIS — D473 Essential (hemorrhagic) thrombocythemia: Secondary | ICD-10-CM

## 2018-09-19 DIAGNOSIS — Z79899 Other long term (current) drug therapy: Secondary | ICD-10-CM

## 2018-09-19 LAB — CBC WITH DIFFERENTIAL (CANCER CENTER ONLY)
Abs Immature Granulocytes: 0.01 10*3/uL (ref 0.00–0.07)
Basophils Absolute: 0 10*3/uL (ref 0.0–0.1)
Basophils Relative: 1 %
Eosinophils Absolute: 0 10*3/uL (ref 0.0–0.5)
Eosinophils Relative: 1 %
HCT: 40.9 % (ref 36.0–46.0)
Hemoglobin: 13.2 g/dL (ref 12.0–15.0)
Immature Granulocytes: 0 %
Lymphocytes Relative: 40 %
Lymphs Abs: 1.8 10*3/uL (ref 0.7–4.0)
MCH: 30.6 pg (ref 26.0–34.0)
MCHC: 32.3 g/dL (ref 30.0–36.0)
MCV: 94.9 fL (ref 80.0–100.0)
Monocytes Absolute: 0.4 10*3/uL (ref 0.1–1.0)
Monocytes Relative: 9 %
Neutro Abs: 2.2 10*3/uL (ref 1.7–7.7)
Neutrophils Relative %: 49 %
Platelet Count: 500 10*3/uL — ABNORMAL HIGH (ref 150–400)
RBC: 4.31 MIL/uL (ref 3.87–5.11)
RDW: 16.4 % — ABNORMAL HIGH (ref 11.5–15.5)
WBC Count: 4.5 10*3/uL (ref 4.0–10.5)
nRBC: 0 % (ref 0.0–0.2)

## 2018-09-19 MED ORDER — HYDROXYUREA 500 MG PO CAPS: 1000.0000 mg | ORAL_CAPSULE | Freq: Every day | ORAL | 1 refills | Status: DC

## 2018-09-19 NOTE — Progress Notes (Signed)
[facility]  Telephone:(336) [phone] Fax:(336) [phone]  Clinic Follow up Note   Patient Care Team: Lavone Orn, MD as PCP - General (Internal Medicine)   Date of Service:  09/19/2018   CHIEF COMPLAINTS:  Follow up for Essential Thrombocytosis, JAK2+  HISTORY OF PRESENTING ILLNESS: 07/12/18 Danielle Oconnell 83 y.o. female is here because of progressive thrombocytosis.  She was found to have abnormal CBC from 02/11/15 with platelet count 445K, no other hematologic abnormalities. Subsequent plt count and CBC in 01/2015 was normal. History of irritable bowel syndrome with constipation and small bowel obstruction in 01/2015 requiring surgical intervention, s/p lysis of adhesions per Dr. Johney Maine on 02/12/15. There is a gap in her records with no CBC since 2016; most recent CBC on 06/19/18 with PLT 696K; otherwise normal with Hgb 13.2 and WBC 6.2.   She denies recent illness, frequent infections, autoimmune disease, IBD such as UC or Crohn's, bleeding, iron deficiency, DVT/PE, splenectomy. A sister had thrombocytopenia, she is deceased. She denies alcohol consumption. She smoked cigarettes remotely for 1 year in 2004. 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 She lives alone, is relatively active in the setting of left knee OA, and independent of all ADLs.   She has history of vaginal wall polyp in 2010 and hyperplastic polyp in the rectum in 2011. Repeat colonoscopy in 10/2015 with tubular adenoma. Last mammogram in 2015 showed density category b and negative for malignancy. She is up to date on age-appropriate cancer screenings.  She has strong family history of cancer, including uterine cancer in mother, stomach cancer in brother, prostate cancer in another brother, lymphoma in sister, lymphoma in nephew, breast cancer in niece at age 44, and a daughter diagnosed with breast cancer at age 70. She has another daughter with DM and son who is healthy. She denies anyone in her family had  genetic testing.   Today she feels well. Denies fatigue, fever, or chills. She has lost 11 lbs intentionally over 2 years. Has chronic mild non-drenching sweats at night occasionally. Takes daily medication for IBS for constipation, has normal BM without bleeding. Left knee pain related to OA, without recent joint injections. Denies cough, chest pain, dyspnea, or leg swelling.    CURRENT THERAPY: Hydrea 560m once daily starting 07/29/18, will increase to 5050mon Mondays and Fridays and 100068mhe rest of the week on 08/24/2018   INTERVAL HISTORY:  Danielle Oconnell here for a follow up of her Essential Thrombocytosis. She presents to the clinic today by herself. She notes she is taking her increased dose of hydrea and tolerating well. She denies any pain or LE swelling. She is fine to increase her hydrea further.     REVIEW OF SYSTEMS:   Constitutional: Denies fevers, chills or abnormal weight loss Eyes: Denies blurriness of vision Ears, nose, mouth, throat, and face: Denies mucositis or sore throat Respiratory: Denies cough, dyspnea or wheezes Cardiovascular: Denies palpitation, chest discomfort or lower extremity swelling Gastrointestinal:  Denies nausea, heartburn or change in bowel habits Skin: Denies abnormal skin rashes Lymphatics: Denies new lymphadenopathy or easy bruising Neurological:Denies numbness, tingling or new weaknesses Behavioral/Psych: Mood is stable, no new changes  All other systems were reviewed with the patient and are negative.  MEDICAL HISTORY:  Past Medical History:  Diagnosis Date   Arthritis    knees   Goiter    not a problem   History of hiatal hernia    Hx of seasonal allergies  Irritable bowel syndrome     SURGICAL HISTORY: Past Surgical History:  Procedure Laterality Date   ABDOMINAL HYSTERECTOMY     COLONOSCOPY W/ POLYPECTOMY     COLONOSCOPY WITH PROPOFOL N/A 11/08/2015   Procedure: COLONOSCOPY WITH PROPOFOL;  Surgeon: Garlan Fair, MD;  Location: [facility];  Service: Endoscopy;  Laterality: N/A;   LAPAROSCOPIC PARTIAL COLECTOMY N/A 02/12/2015   Procedure: DIAGNOSTIC LAPAROSCOPY WITH LYSIS OF ADHESIONS;  Surgeon: Michael Boston, MD;  Location: [facility];  Service: General;  Laterality: N/A;-"small bowel obstruction"   LEFT OOPHORECTOMY      I have reviewed the social history and family history with the patient and they are unchanged from previous note.  ALLERGIES:  is allergic to sulfa antibiotics.  MEDICATIONS:  Current Outpatient Medications  Medication Sig Dispense Refill   Ascorbic Acid (VITAMIN C) 1000 MG tablet Take 1,000 mg by mouth daily.     aspirin EC 81 MG tablet Take 81 mg by mouth daily.     B Complex Vitamins (B COMPLEX 1 PO) B Complex     CASCARA SAGRADA PO Take by mouth daily.     Cranberry 500 MG CAPS Take 500 mg by mouth daily.     Evening Primrose Oil 500 MG CAPS Take 1 capsule by mouth daily.     hydroxyurea (HYDREA) 500 MG capsule Take 2 capsules (1,000 mg total) by mouth daily. Take 2 tabs daily except 1 tab daily on Mondays and Fridays 60 capsule 1   Omega 3 1000 MG CAPS Take 1 capsule by mouth daily.     Vitamin D3 (VITAMIN D) 25 MCG tablet Take 50 mcg by mouth daily.     No current facility-administered medications for this visit.     PHYSICAL EXAMINATION: ECOG PERFORMANCE STATUS: 0 - Asymptomatic  Vitals:   09/19/18 0908  BP: 132/75  Pulse: 72  Resp: 18  Temp: 97.9 F (36.6 C)  SpO2: 99%   Filed Weights   09/19/18 0908  Weight: 178 lb 14.4 oz (81.1 kg)    GENERAL:alert, no distress and comfortable SKIN: skin color, texture, turgor are normal, no rashes or significant lesions EYES: normal, Conjunctiva are pink and non-injected, sclera clear OROPHARYNX:no exudate, no erythema and lips, buccal mucosa, and tongue normal  NECK: supple, thyroid normal size, non-tender, without nodularity LYMPH:  no palpable lymphadenopathy in the cervical, axillary or  inguinal LUNGS: clear to auscultation and percussion with normal breathing effort HEART: regular rate & rhythm and no murmurs and no lower extremity edema ABDOMEN:abdomen soft, non-tender and normal bowel sounds Musculoskeletal:no cyanosis of digits and no clubbing  NEURO: alert & oriented x 3 with fluent speech, no focal motor/sensory deficits  LABORATORY DATA:  I have reviewed the data as listed CBC Latest Ref Rng & Units 09/19/2018 08/23/2018 08/09/2018  WBC 4.0 - 10.5 K/uL 4.5 4.9 5.5  Hemoglobin 12.0 - 15.0 g/dL 13.2 13.4 13.5  Hematocrit 36.0 - 46.0 % 40.9 40.3 41.8  Platelets 150 - 400 K/uL 500(H) 574(H) 684(H)     CMP Latest Ref Rng & Units 08/23/2018 02/14/2015 02/13/2015  Glucose 70 - 99 mg/dL 98 94 88  BUN 8 - 23 mg/dL 14 8 11   Creatinine 0.44 - 1.00 mg/dL 0.81 0.70 0.65  Sodium 135 - 145 mmol/L 144 140 140  Potassium 3.5 - 5.1 mmol/L 4.1 3.5 3.9  Chloride 98 - 111 mmol/L 107 106 105  CO2 22 - 32 mmol/L 28 28 25   Calcium 8.9 - 10.3 mg/dL 9.7  8.2(L) 8.6  Total Protein 6.5 - 8.1 g/dL 7.0 - -  Total Bilirubin 0.3 - 1.2 mg/dL 0.5 - -  Alkaline Phos 38 - 126 U/L 67 - -  AST 15 - 41 U/L 19 - -  ALT 0 - 44 U/L 15 - -    PATHOLOGY  Diagnosis 07/23/18 Bone Marrow, Aspirate,Biopsy, and Clot - HYPERCELLULAR BONE MARROW FOR AGE WITH MEGAKARYOCYTIC PROLIFERATION. - SEVERAL LYMPHOID AGGREGATES PRESENT. - SEE COMMENT. PERIPHERAL BLOOD: - THROMBOCYTOSIS. Diagnosis Note The bone marrow is hypercellular for age including increased number of megakaryocytes with many atypical and large forms. No significant dyspoiesis is seen in other cell lines and no increase in blastic cells is identified. In the presence of long-standing thrombocytosis, the features are very concerning for a myeloproliferative neoplasm, particularly essential thrombocythemia. Correlation with cytogenetic and molecular (JAK2,etc) studies is recommended. The bone marrow also displays several lymphoid aggregates of  primarily small lymphoid cells admixed with scattering of larger lymphoid cells with features of prolymphocytes/paraimmunoblasts. No peripheral blood lymphocytosis is seen. Flow cytometric analysis and immunohistochemical stains were performed and failed to show any definitive phenotypic abnormalities. Nonetheless, based on the morphological assessment, an early lymphoproliferative process such as small lymphocytic lymphoma/chronic lymphocytic leukemia is not excluded. Clinical follow up is recommended. (BNS:ecj 07/24/2018) Interpretation Bone Marrow Flow Cytometry - NO MONOCLONAL B-CELL POPULATION OR ABNORMAL T-CELL PHENOTYPE IDENTIFIED. Susanne Greenhouse MD Pathologist, Electronic Signature (Case signed 07/25/2018) Microscopic Gated population: Flow cytometric immunophenotyping is performed using antibiodies to the antigens listed in the table below. Electronic gates are placed around a cell cluster displaying light scatter properties corresponding to lymphocytes. - Abnormal Cells in gated population: N/A - Phenotype of Abnormal Cells: N/A      RADIOGRAPHIC STUDIES: I have personally reviewed the radiological images as listed and agreed with the findings in the report. No results found.   ASSESSMENT & PLAN:  Danielle Oconnell is a 83 y.o. female with a history of IBS, arthritis, goiter, and elevated platelet count.    1. Essential Thrombocythemia, JAK2(+) -I previously reviewed her medical record in detail with the patient. She had remote thrombocytosis in 2016, but has worsened lately to 696K.  -We previously discussed the potential cause of her thrombocytosis, such as reactive thrombocytosis from iron deficiency or malignancy. However she does not appear to have clinical suspicion for reactive thrombocytosis, and I am concerned this could be essential thrombocythemia.  -07/23/18 labs showed, iron panel and Hg WNL, her PLT elevated 722K.  -Her 07/23/18 Bone Marrow biopsy showed hypocellular  bone marrow, with megakaryocyte proliferation and her  genetic mutation testing results showed JAK2(+), this is consistent with essential thrombocythemia. --She started Hydrea 561m daily on 07/29/18 once daily, dose increased 4 weeks later. She has tolerated well so far with no significant side effects.  -Labs reviewed, CBC WNL except plt at 500K. To further decrease her plt I will increase Hydrea to 10023meveryday except 50067mn Mondays and Fridays with goal of plt in 200-400K.  -Continue baby aspirin 50m29mily  -lab every 2 weeks and F/u in 6 weeks   2. Family history of cancer  -She has strong family history of cancer, including uterine, stomach, prostate, pancreas, and breast, concerning for inheritable cancer syndrome such as lynch syndrome. -Closest living relative with cancer is her daughter, who was diagnosed with breast cancer at age 33. 51previously recommended she discuss this with her daughter and suggests she get genetic testing.  -If the patient's daughter is tested, then  the patient can be tested. She agreed to discuss with her daughter. We can help set her up with our [facility] genetic counselor if needed.   3. Health maintenance, disease prevention  -she continues f/u with PCP, GI   PLAN:   -Labs reviewed, PLT at 500K, Increase Hydrea to 101m daily except 5079mon Mondays and Fridays, refilled for her today  -Continue baby aspirin 814maily  -Lab in 2 and 4 weeks -Lab and f/u in 6 weeks    No problem-specific Assessment & Plan notes found for this encounter.   No orders of the defined types were placed in this encounter.  All questions were answered. The patient knows to call the clinic with any problems, questions or concerns. No barriers to learning was detected. I spent 15 minutes counseling the patient face to face. The total time spent in the appointment was 20 minutes and more than 50% was on counseling and review of test results     AmoJoslyn Devon/31/2019  9:43 AM   I, AmoJoslyn Devonm acting as scribe for YanTruitt MerleD.   I have reviewed the above documentation for accuracy and completeness, and I agree with the above.

## 2018-09-19 NOTE — Telephone Encounter (Signed)
Gave pt avs and calendar  °

## 2018-09-24 MED FILL — HYDROXYUREA 500 MG CAPSULE: 500 | 35 days supply | Qty: 60 | Fill #0

## 2018-10-02 ENCOUNTER — Inpatient Hospital Stay: Payer: Medicare Other | Attending: Hematology

## 2018-10-02 DIAGNOSIS — D75839 Thrombocytosis, unspecified: Secondary | ICD-10-CM

## 2018-10-02 DIAGNOSIS — D473 Essential (hemorrhagic) thrombocythemia: Secondary | ICD-10-CM | POA: Insufficient documentation

## 2018-10-02 LAB — CBC WITH DIFFERENTIAL (CANCER CENTER ONLY)
Abs Immature Granulocytes: 0.01 10*3/uL (ref 0.00–0.07)
Basophils Absolute: 0 10*3/uL (ref 0.0–0.1)
Basophils Relative: 1 %
Eosinophils Absolute: 0.1 10*3/uL (ref 0.0–0.5)
Eosinophils Relative: 2 %
HCT: 41.2 % (ref 36.0–46.0)
Hemoglobin: 13.4 g/dL (ref 12.0–15.0)
Immature Granulocytes: 0 %
Lymphocytes Relative: 46 %
Lymphs Abs: 2.1 10*3/uL (ref 0.7–4.0)
MCH: 31.2 pg (ref 26.0–34.0)
MCHC: 32.5 g/dL (ref 30.0–36.0)
MCV: 95.8 fL (ref 80.0–100.0)
Monocytes Absolute: 0.4 10*3/uL (ref 0.1–1.0)
Monocytes Relative: 8 %
Neutro Abs: 2 10*3/uL (ref 1.7–7.7)
Neutrophils Relative %: 43 %
Platelet Count: 470 10*3/uL — ABNORMAL HIGH (ref 150–400)
RBC: 4.3 MIL/uL (ref 3.87–5.11)
RDW: 16.8 % — ABNORMAL HIGH (ref 11.5–15.5)
WBC Count: 4.5 10*3/uL (ref 4.0–10.5)
nRBC: 0 % (ref 0.0–0.2)

## 2018-10-16 ENCOUNTER — Inpatient Hospital Stay: Payer: Medicare Other

## 2018-10-16 DIAGNOSIS — D473 Essential (hemorrhagic) thrombocythemia: Secondary | ICD-10-CM | POA: Diagnosis not present

## 2018-10-16 DIAGNOSIS — D75839 Thrombocytosis, unspecified: Secondary | ICD-10-CM

## 2018-10-16 LAB — CBC WITH DIFFERENTIAL (CANCER CENTER ONLY)
Abs Immature Granulocytes: 0 10*3/uL (ref 0.00–0.07)
Basophils Absolute: 0 10*3/uL (ref 0.0–0.1)
Basophils Relative: 1 %
Eosinophils Absolute: 0.1 10*3/uL (ref 0.0–0.5)
Eosinophils Relative: 2 %
HCT: 40.7 % (ref 36.0–46.0)
Hemoglobin: 13.1 g/dL (ref 12.0–15.0)
Immature Granulocytes: 0 %
Lymphocytes Relative: 45 %
Lymphs Abs: 2.1 10*3/uL (ref 0.7–4.0)
MCH: 31.6 pg (ref 26.0–34.0)
MCHC: 32.2 g/dL (ref 30.0–36.0)
MCV: 98.1 fL (ref 80.0–100.0)
Monocytes Absolute: 0.4 10*3/uL (ref 0.1–1.0)
Monocytes Relative: 10 %
Neutro Abs: 1.9 10*3/uL (ref 1.7–7.7)
Neutrophils Relative %: 42 %
Platelet Count: 385 10*3/uL (ref 150–400)
RBC: 4.15 MIL/uL (ref 3.87–5.11)
RDW: 16.9 % — ABNORMAL HIGH (ref 11.5–15.5)
WBC Count: 4.5 10*3/uL (ref 4.0–10.5)
nRBC: 0 % (ref 0.0–0.2)

## 2018-10-25 ENCOUNTER — Other Ambulatory Visit: Payer: Self-pay

## 2018-10-25 DIAGNOSIS — D473 Essential (hemorrhagic) thrombocythemia: Secondary | ICD-10-CM

## 2018-10-25 MED ORDER — HYDROXYUREA 500 MG PO CAPS: 1000.0000 mg | ORAL_CAPSULE | Freq: Every day | ORAL | 1 refills | Status: DC

## 2018-10-25 MED FILL — HYDROXYUREA 500 MG CAPSULE: 500 | 35 days supply | Qty: 60 | Fill #0

## 2018-10-30 NOTE — Progress Notes (Signed)
[place]   Telephone:(336) [phone] Fax:(336) [phone]   Clinic Follow up Note   Patient Care Team: Lavone Orn, MD as PCP - General (Internal Medicine)  Date of Service:  11/01/2018  CHIEF COMPLAINT: Follow up for Essential Thrombocytosis, JAK2+   PREVIOUS AND CURRENT THERAPY:  1. Hydrea 531m once daily starting 07/29/18, will increase to 5089mon Mondays and Fridays and 100015mhe rest of the week on 08/24/2018. She responded very well  2. Due to pt's concerns of side effects from hydrea, I will  Switch hydrea to Anagrelide in 11/2018.   INTERVAL HISTORY:  Danielle Oconnell here for a follow up of her ET. She presents to the clinic today by herself. She notes she is doing well. She is tolerating Hydrea but expresses she wants to stop it. She wants to complete her current bottle and stop. She notes her concerns is too many side effects to it and she is gaining weight even with eating light. She attributes this to hydrea. She notes her appetite is adequate.     REVIEW OF SYSTEMS:   Constitutional: Denies fevers, chills or abnormal weight loss (+) weight gain  Eyes: Denies blurriness of vision Ears, nose, mouth, throat, and face: Denies mucositis or sore throat Respiratory: Denies cough, dyspnea or wheezes Cardiovascular: Denies palpitation, chest discomfort or lower extremity swelling Gastrointestinal:  Denies nausea, heartburn or change in bowel habits Skin: Denies abnormal skin rashes Lymphatics: Denies new lymphadenopathy or easy bruising Neurological:Denies numbness, tingling or new weaknesses Behavioral/Psych: Mood is stable, no new changes  All other systems were reviewed with the patient and are negative.  MEDICAL HISTORY:  Past Medical History:  Diagnosis Date  . Arthritis    knees  . Goiter    not a problem  . History of hiatal hernia   . Hx of seasonal allergies   . Irritable bowel syndrome     SURGICAL HISTORY: Past Surgical History:    Procedure Laterality Date  . ABDOMINAL HYSTERECTOMY    . COLONOSCOPY W/ POLYPECTOMY    . COLONOSCOPY WITH PROPOFOL N/A 11/08/2015   Procedure: COLONOSCOPY WITH PROPOFOL;  Surgeon: MarGarlan FairD;  Location: [facility];  Service: Endoscopy;  Laterality: N/A;  . LAPAROSCOPIC PARTIAL COLECTOMY N/A 02/12/2015   Procedure: DIAGNOSTIC LAPAROSCOPY WITH LYSIS OF ADHESIONS;  Surgeon: SteMichael BostonD;  Location: [facility];  Service: General;  Laterality: N/A;-"small bowel obstruction"  . LEFT OOPHORECTOMY      I have reviewed the social history and family history with the patient and they are unchanged from previous note.  ALLERGIES:  is allergic to sulfa antibiotics.  MEDICATIONS:  Current Outpatient Medications  Medication Sig Dispense Refill  . Ascorbic Acid (VITAMIN C) 1000 MG tablet Take 1,000 mg by mouth daily.    . aMarland Kitchenpirin EC 81 MG tablet Take 81 mg by mouth daily.    . B Complex Vitamins (B COMPLEX 1 PO) B Complex    . CASCARA SAGRADA PO Take by mouth daily.    . Cranberry 500 MG CAPS Take 500 mg by mouth daily.    . Evening Primrose Oil 500 MG CAPS Take 1 capsule by mouth daily.    . hydroxyurea (HYDREA) 500 MG capsule Take 2 capsules (1,000 mg total) by mouth daily. Take 2 tabs daily except 1 tab daily on Mondays and Fridays 60 capsule 1  . Omega 3 1000 MG CAPS Take 1 capsule by mouth daily.    . Vitamin D3 (VITAMIN  D) 25 MCG tablet Take 50 mcg by mouth daily.    Marland Kitchen anagrelide (AGRYLIN) 0.5 MG capsule Take 1 capsule (0.5 mg total) by mouth 2 (two) times daily. 60 capsule 1   No current facility-administered medications for this visit.     PHYSICAL EXAMINATION: ECOG PERFORMANCE STATUS: 0 - Asymptomatic  Vitals:   11/01/18 1004  BP: 136/77  Pulse: 67  Resp: 18  Temp: 97.6 F (36.4 C)  SpO2: 100%   Filed Weights   11/01/18 1004  Weight: 180 lb 11.2 oz (82 kg)    GENERAL:alert, no distress and comfortable SKIN: skin color, texture, turgor are normal, no rashes or  significant lesions EYES: normal, Conjunctiva are pink and non-injected, sclera clear OROPHARYNX:no exudate, no erythema and lips, buccal mucosa, and tongue normal  NECK: supple, thyroid normal size, non-tender, without nodularity LYMPH:  no palpable lymphadenopathy in the cervical, axillary or inguinal LUNGS: clear to auscultation and percussion with normal breathing effort HEART: regular rate & rhythm and no murmurs and no lower extremity edema ABDOMEN:abdomen soft, non-tender and normal bowel sounds Musculoskeletal:no cyanosis of digits and no clubbing  NEURO: alert & oriented x 3 with fluent speech, no focal motor/sensory deficits  LABORATORY DATA:  I have reviewed the data as listed CBC Latest Ref Rng & Units 11/01/2018 10/16/2018 10/02/2018  WBC 4.0 - 10.5 K/uL 4.5 4.5 4.5  Hemoglobin 12.0 - 15.0 g/dL 13.2 13.1 13.4  Hematocrit 36.0 - 46.0 % 40.2 40.7 41.2  Platelets 150 - 400 K/uL 364 385 470(H)     CMP Latest Ref Rng & Units 11/01/2018 08/23/2018 02/14/2015  Glucose 70 - 99 mg/dL 85 98 94  BUN 8 - 23 mg/dL 14 14 8   Creatinine 0.44 - 1.00 mg/dL 1.00 0.81 0.70  Sodium 135 - 145 mmol/L 144 144 140  Potassium 3.5 - 5.1 mmol/L 4.2 4.1 3.5  Chloride 98 - 111 mmol/L 108 107 106  CO2 22 - 32 mmol/L 29 28 28   Calcium 8.9 - 10.3 mg/dL 10.1 9.7 8.2(L)  Total Protein 6.5 - 8.1 g/dL 7.0 7.0 -  Total Bilirubin 0.3 - 1.2 mg/dL 0.5 0.5 -  Alkaline Phos 38 - 126 U/L 67 67 -  AST 15 - 41 U/L 20 19 -  ALT 0 - 44 U/L 13 15 -      RADIOGRAPHIC STUDIES: I have personally reviewed the radiological images as listed and agreed with the findings in the report. No results found.   ASSESSMENT & PLAN:  Danielle Oconnell is a 83 y.o. female with   1. Essential Thrombocythemia, JAK2(+) -Her 07/23/18 Bone Marrow biopsy showed hypercellular bone marrow, with megakaryocyte proliferation and her  genetic mutation testing results showed JAK2(+), this is consistent with essential thrombocythemia. -She  started Hydrea 517m daily on 07/29/18 once daily, dose increased 4 weeks later. She responded well and thrombocytosis resolved -Due to the concerns of side effects from Hydrea, specifically weight gain, risk of leukemia, pt wants to stop Hydrea. I discussed other treatment options, especially Anagrelide. I reviewed side effects in great detail, especially increase of cardiovascular disease. I discussed her risk from the disease is higher than her risk of side effects from medication. She understands and agrees to try anagrelide.  -After she completes hydrea bottle in a month she will start with low dose Anagrelide 158mdaily and we will titrate up dose as needed -Labs reviewed, CBC WNL and stable.   -Continue baby aspirin 8179maily  -F/u in 12 weeks  2. Family history of cancer  -Closest living relative with cancer is her daughter, who was diagnosed with breast cancer at age 26. I previously recommended she discuss this with her daughter and suggests she get genetic testing. We can help set her up with our [facility] genetic counselor if needed.   3. Health maintenance, disease prevention  -she continues f/u with PCP, GI   PLAN:   -Complete current dose of Hydrea and she will finish what she has at home in 4 weeks, then switch to Anagrelide. I filled anagrelide 0.5 mg twice daily today  -Lab in 6 weeks, and 9 weeks  -Lab and f/u in 12 weeks     No problem-specific Assessment & Plan notes found for this encounter.   No orders of the defined types were placed in this encounter.  All questions were answered. The patient knows to call the clinic with any problems, questions or concerns. No barriers to learning was detected. I spent 20 minutes counseling the patient face to face. The total time spent in the appointment was 25 minutes and more than 50% was on counseling and review of test results     Truitt Merle, MD 11/01/2018   I, Joslyn Devon, am acting as scribe for Truitt Merle, MD.   I  have reviewed the above documentation for accuracy and completeness, and I agree with the above.

## 2018-11-01 ENCOUNTER — Inpatient Hospital Stay (HOSPITAL_BASED_OUTPATIENT_CLINIC_OR_DEPARTMENT_OTHER): Payer: Medicare Other | Admitting: Hematology

## 2018-11-01 ENCOUNTER — Inpatient Hospital Stay: Payer: Medicare Other | Attending: Hematology

## 2018-11-01 ENCOUNTER — Telehealth: Payer: Self-pay | Admitting: Hematology

## 2018-11-01 ENCOUNTER — Encounter: Payer: Self-pay | Admitting: Hematology

## 2018-11-01 VITALS — BP 136/77 | HR 67 | Temp 97.6°F | Resp 18 | Ht 61.52 in | Wt 180.7 lb

## 2018-11-01 DIAGNOSIS — D473 Essential (hemorrhagic) thrombocythemia: Secondary | ICD-10-CM | POA: Diagnosis not present

## 2018-11-01 DIAGNOSIS — Z7982 Long term (current) use of aspirin: Secondary | ICD-10-CM | POA: Diagnosis not present

## 2018-11-01 DIAGNOSIS — Z803 Family history of malignant neoplasm of breast: Secondary | ICD-10-CM | POA: Insufficient documentation

## 2018-11-01 DIAGNOSIS — Z79899 Other long term (current) drug therapy: Secondary | ICD-10-CM | POA: Insufficient documentation

## 2018-11-01 DIAGNOSIS — D75839 Thrombocytosis, unspecified: Secondary | ICD-10-CM

## 2018-11-01 LAB — CBC WITH DIFFERENTIAL (CANCER CENTER ONLY)
Abs Immature Granulocytes: 0 10*3/uL (ref 0.00–0.07)
Basophils Absolute: 0 10*3/uL (ref 0.0–0.1)
Basophils Relative: 1 %
Eosinophils Absolute: 0.1 10*3/uL (ref 0.0–0.5)
Eosinophils Relative: 1 %
HCT: 40.2 % (ref 36.0–46.0)
Hemoglobin: 13.2 g/dL (ref 12.0–15.0)
Immature Granulocytes: 0 %
Lymphocytes Relative: 41 %
Lymphs Abs: 1.8 10*3/uL (ref 0.7–4.0)
MCH: 31.7 pg (ref 26.0–34.0)
MCHC: 32.8 g/dL (ref 30.0–36.0)
MCV: 96.6 fL (ref 80.0–100.0)
Monocytes Absolute: 0.4 10*3/uL (ref 0.1–1.0)
Monocytes Relative: 9 %
Neutro Abs: 2.1 10*3/uL (ref 1.7–7.7)
Neutrophils Relative %: 48 %
Platelet Count: 364 10*3/uL (ref 150–400)
RBC: 4.16 MIL/uL (ref 3.87–5.11)
RDW: 16.7 % — ABNORMAL HIGH (ref 11.5–15.5)
WBC Count: 4.5 10*3/uL (ref 4.0–10.5)
nRBC: 0 % (ref 0.0–0.2)

## 2018-11-01 LAB — CMP (CANCER CENTER ONLY)
ALT: 13 U/L (ref 0–44)
AST: 20 U/L (ref 15–41)
Albumin: 4 g/dL (ref 3.5–5.0)
Alkaline Phosphatase: 67 U/L (ref 38–126)
Anion gap: 7 (ref 5–15)
BUN: 14 mg/dL (ref 8–23)
CO2: 29 mmol/L (ref 22–32)
Calcium: 10.1 mg/dL (ref 8.9–10.3)
Chloride: 108 mmol/L (ref 98–111)
Creatinine: 1 mg/dL (ref 0.44–1.00)
GFR, Est AFR Am: >60 mL/min (ref >60)
GFR, Estimated: 53 mL/min — ABNORMAL LOW (ref >60)
Glucose, Bld: 85 mg/dL (ref 70–99)
Potassium: 4.2 mmol/L (ref 3.5–5.1)
Sodium: 144 mmol/L (ref 135–145)
Total Bilirubin: 0.5 mg/dL (ref 0.3–1.2)
Total Protein: 7 g/dL (ref 6.5–8.1)

## 2018-11-01 MED ORDER — ANAGRELIDE HCL 0.5 MG PO CAPS: 0.5000 mg | ORAL_CAPSULE | Freq: Two times a day (BID) | ORAL | 1 refills | Status: DC

## 2018-11-01 NOTE — Telephone Encounter (Signed)
Printed calendar and avs. °

## 2018-12-02 DIAGNOSIS — E041 Nontoxic single thyroid nodule: Secondary | ICD-10-CM | POA: Diagnosis not present

## 2018-12-02 DIAGNOSIS — E042 Nontoxic multinodular goiter: Secondary | ICD-10-CM | POA: Diagnosis not present

## 2018-12-02 DIAGNOSIS — R7989 Other specified abnormal findings of blood chemistry: Secondary | ICD-10-CM | POA: Diagnosis not present

## 2018-12-13 ENCOUNTER — Inpatient Hospital Stay: Payer: Medicare Other | Attending: Hematology

## 2018-12-13 DIAGNOSIS — D473 Essential (hemorrhagic) thrombocythemia: Secondary | ICD-10-CM | POA: Insufficient documentation

## 2018-12-13 DIAGNOSIS — D75839 Thrombocytosis, unspecified: Secondary | ICD-10-CM

## 2018-12-13 LAB — CBC WITH DIFFERENTIAL (CANCER CENTER ONLY)
Abs Immature Granulocytes: 0.06 10*3/uL (ref 0.00–0.07)
Basophils Absolute: 0 10*3/uL (ref 0.0–0.1)
Basophils Relative: 1 %
Eosinophils Absolute: 0.1 10*3/uL (ref 0.0–0.5)
Eosinophils Relative: 2 %
HCT: 42.5 % (ref 36.0–46.0)
Hemoglobin: 14.2 g/dL (ref 12.0–15.0)
Immature Granulocytes: 1 %
Lymphocytes Relative: 41 %
Lymphs Abs: 2.1 10*3/uL (ref 0.7–4.0)
MCH: 32.4 pg (ref 26.0–34.0)
MCHC: 33.4 g/dL (ref 30.0–36.0)
MCV: 97 fL (ref 80.0–100.0)
Monocytes Absolute: 0.5 10*3/uL (ref 0.1–1.0)
Monocytes Relative: 9 %
Neutro Abs: 2.4 10*3/uL (ref 1.7–7.7)
Neutrophils Relative %: 46 %
Platelet Count: 428 10*3/uL — ABNORMAL HIGH (ref 150–400)
RBC: 4.38 MIL/uL (ref 3.87–5.11)
RDW: 13.8 % (ref 11.5–15.5)
WBC Count: 5.1 10*3/uL (ref 4.0–10.5)
nRBC: 0 % (ref 0.0–0.2)

## 2018-12-13 MED FILL — ANAGRELIDE HCL 0.5 MG CAP: 0.5 | 30 days supply | Qty: 60 | Fill #0

## 2018-12-17 ENCOUNTER — Other Ambulatory Visit: Payer: Self-pay | Admitting: Hematology

## 2018-12-17 MED ORDER — ANAGRELIDE HCL 0.5 MG PO CAPS: 0.5000 mg | ORAL_CAPSULE | Freq: Two times a day (BID) | ORAL | 1 refills | Status: DC

## 2018-12-25 ENCOUNTER — Telehealth: Payer: Self-pay | Admitting: Hematology

## 2018-12-25 NOTE — Telephone Encounter (Signed)
Patient came in to reschedule her lab appt.

## 2019-01-01 ENCOUNTER — Inpatient Hospital Stay: Payer: Medicare Other | Attending: Hematology

## 2019-01-01 DIAGNOSIS — D75839 Thrombocytosis, unspecified: Secondary | ICD-10-CM

## 2019-01-01 DIAGNOSIS — D473 Essential (hemorrhagic) thrombocythemia: Secondary | ICD-10-CM | POA: Insufficient documentation

## 2019-01-01 LAB — CBC WITH DIFFERENTIAL (CANCER CENTER ONLY)
Abs Immature Granulocytes: 0.02 10*3/uL (ref 0.00–0.07)
Basophils Absolute: 0 10*3/uL (ref 0.0–0.1)
Basophils Relative: 1 %
Eosinophils Absolute: 0.1 10*3/uL (ref 0.0–0.5)
Eosinophils Relative: 2 %
HCT: 39.5 % (ref 36.0–46.0)
Hemoglobin: 12.7 g/dL (ref 12.0–15.0)
Immature Granulocytes: 0 %
Lymphocytes Relative: 31 %
Lymphs Abs: 1.9 10*3/uL (ref 0.7–4.0)
MCH: 31.8 pg (ref 26.0–34.0)
MCHC: 32.2 g/dL (ref 30.0–36.0)
MCV: 99 fL (ref 80.0–100.0)
Monocytes Absolute: 0.6 10*3/uL (ref 0.1–1.0)
Monocytes Relative: 10 %
Neutro Abs: 3.5 10*3/uL (ref 1.7–7.7)
Neutrophils Relative %: 56 %
Platelet Count: 479 10*3/uL — ABNORMAL HIGH (ref 150–400)
RBC: 3.99 MIL/uL (ref 3.87–5.11)
RDW: 13.2 % (ref 11.5–15.5)
WBC Count: 6.2 10*3/uL (ref 4.0–10.5)
nRBC: 0 % (ref 0.0–0.2)

## 2019-01-03 ENCOUNTER — Other Ambulatory Visit: Payer: Medicare Other

## 2019-01-13 MED FILL — ANAGRELIDE HCL 0.5 MG CAP: 0.5 | 30 days supply | Qty: 60 | Fill #1

## 2019-01-20 ENCOUNTER — Telehealth: Payer: Self-pay | Admitting: Hematology

## 2019-01-20 NOTE — Telephone Encounter (Signed)
Per patient (walk-in), needs to reschedule 3/6 appt. Moved from 3/6 to 3/10.

## 2019-01-21 NOTE — Progress Notes (Signed)
[place]   [phone] [phone]   Clinic Follow up Note   Patient Care Team: Danielle Orn, MD as PCP - General (Internal Medicine) 01/28/2019  CHIEF COMPLAINT: F/u for essential Thrombocytosis, JAK2+   PREVIOUS AND CURRENT THERAPY:  1. Hydrea 550m once daily starting 07/29/18, willincrease to5068mon Mondays and Fridays and100038mhe rest of the week on 08/24/2018. She responded very well  2. Due to pt's concerns of side effects from hydrea, Switch hydrea to Anagrelide on 11/2018. she started on 0.5mg16mice daily in 11/2018, increased to 1mg 55mand 0.5mg p61mn 01/28/2019    INTERVAL HISTORY: [name]83 y.o65female who is here for follow-up of her ET. Today, she is here alone and is doing well. She denies any problems since starting Anagrelide. She takes 1 tablet in the morning and 1 in the evening . Denies dizziness, stomach problems or any previous heart disease. She experiences lower extremity edema when she walks a lot but the swelling reduces after she rests.    Pertinent positives and negatives of review of systems are listed and detailed within the above HPI.  REVIEW OF SYSTEMS:   Constitutional: Denies fevers, chills or abnormal weight loss Eyes: Denies blurriness of vision Ears, nose, mouth, throat, and face: Denies mucositis or sore throat Respiratory: Denies cough, dyspnea or wheezes Cardiovascular: Denies palpitation, chest discomfort, (+) lower extremity swelling Gastrointestinal:  Denies nausea, heartburn or change in bowel habits Skin: Denies abnormal skin rashes Lymphatics: Denies new lymphadenopathy or easy bruising Neurological:Denies numbness, tingling or new weaknesses Behavioral/Psych: Mood is stable, no new changes  All other systems were reviewed with the patient and are negative.  MEDICAL HISTORY:  Past Medical History:  Diagnosis Date  . Arthritis    knees  . Goiter    not a problem  . History of  hiatal hernia   . Hx of seasonal allergies   . Irritable bowel syndrome     SURGICAL HISTORY: Past Surgical History:  Procedure Laterality Date  . ABDOMINAL HYSTERECTOMY    . COLONOSCOPY W/ POLYPECTOMY    . COLONOSCOPY WITH PROPOFOL N/A 11/08/2015   Procedure: COLONOSCOPY WITH PROPOFOL;  Surgeon: Danielle Oconnell Location: [facility];  Service: Endoscopy;  Laterality: N/A;  . LAPAROSCOPIC PARTIAL COLECTOMY N/A 02/12/2015   Procedure: DIAGNOSTIC LAPAROSCOPY WITH LYSIS OF ADHESIONS;  Surgeon: Danielle Oconnell Location: [facility];  Service: General;  Laterality: N/A;-"small bowel obstruction"  . LEFT OOPHORECTOMY      I have reviewed the social history and family history with the patient and they are unchanged from previous note.  ALLERGIES:  is allergic to sulfa antibiotics.  MEDICATIONS:  Current Outpatient Medications  Medication Sig Dispense Refill  . anagrelide (AGRYLIN) 0.5 MG capsule Take 2 capsules in the morning and 1 in the evening 180 capsule 0  . Ascorbic Acid (VITAMIN C) 1000 MG tablet Take 1,000 mg by mouth daily.    . aspiMarland Kitchenin EC 81 MG tablet Take 81 mg by mouth daily.    . B Complex Vitamins (B COMPLEX 1 PO) B Complex    . Cranberry 500 MG CAPS Take 500 mg by mouth daily.    . Evening Primrose Oil 500 MG CAPS Take 1 capsule by mouth daily.    . Omega-3 Fatty Acids (FISH OIL) 1200 MG CAPS Take 1 capsule by mouth daily.    . CASCARA SAGRADA PO Take by mouth daily.  No current facility-administered medications for this visit.     PHYSICAL EXAMINATION: ECOG PERFORMANCE STATUS: 0 - Asymptomatic  Vitals:   01/28/19 0912  BP: 135/77  Pulse: 70  Resp: 17  Temp: 98 F (36.7 C)  SpO2: 99%   Filed Weights   01/28/19 0912  Weight: 179 lb (81.2 kg)    GENERAL:alert, no distress and comfortable SKIN: skin color, texture, turgor are normal, no rashes or significant lesions EYES: normal, Conjunctiva are pink and non-injected, sclera clear OROPHARYNX:no  exudate, no erythema and lips, buccal mucosa, and tongue normal  NECK: supple, thyroid normal size, non-tender, without nodularity LYMPH:  no palpable lymphadenopathy in the cervical, axillary or inguinal LUNGS: clear to auscultation and percussion with normal breathing effort HEART: regular rate & rhythm and no murmurs, (+)  lower extremity edema ABDOMEN:abdomen soft, non-tender and normal bowel sounds Musculoskeletal:no cyanosis of digits and no clubbing  NEURO: alert & oriented x 3 with fluent speech, no focal motor/sensory deficits  LABORATORY DATA:  I have reviewed the data as listed CBC Latest Ref Rng & Units 01/28/2019 01/01/2019 12/13/2018  WBC 4.0 - 10.5 K/uL 6.3 6.2 5.1  Hemoglobin 12.0 - 15.0 g/dL 13.0 12.7 14.2  Hematocrit 36.0 - 46.0 % 41.0 39.5 42.5  Platelets 150 - 400 K/uL 487(H) 479(H) 428(H)     CMP Latest Ref Rng & Units 11/01/2018 08/23/2018 02/14/2015  Glucose 70 - 99 mg/dL 85 98 94  BUN 8 - 23 mg/dL 14 14 8   Creatinine 0.44 - 1.00 mg/dL 1.00 0.81 0.70  Sodium 135 - 145 mmol/L 144 144 140  Potassium 3.5 - 5.1 mmol/L 4.2 4.1 3.5  Chloride 98 - 111 mmol/L 108 107 106  CO2 22 - 32 mmol/L 29 28 28   Calcium 8.9 - 10.3 mg/dL 10.1 9.7 8.2(L)  Total Protein 6.5 - 8.1 g/dL 7.0 7.0 -  Total Bilirubin 0.3 - 1.2 mg/dL 0.5 0.5 -  Alkaline Phos 38 - 126 U/L 67 67 -  AST 15 - 41 U/L 20 19 -  ALT 0 - 44 U/L 13 15 -      RADIOGRAPHIC STUDIES: I have personally reviewed the radiological images as listed and agreed with the findings in the report. No results found.   ASSESSMENT & PLAN:  Danielle Oconnell is a 84 y.o. female with history of  1. Essential Thrombocythemia, JAK2(+) -Her 07/23/18 Bone Marrow biopsy showed hypercellular bone marrow, with megakaryocyte proliferation and her genetic mutation testing results showed JAK2(+), this is consistent with essential thrombocythemia. -She started Hydrea 583m daily on 07/29/18 once daily, dose increased 4 weeks later.She  responded well and thrombocytosis resolved -Last visit, due to pt concerns of side effects from Hydrea, specifically weight gain, risk of leukemia, she wanted to stop Hydrea and start Anagrelide -She started  Anagrelide 0.5645mtwice daily about 6 weeks ago and is tolerating well. She denies any problems due to Anagrelide.  - I discussed with her side effects of increase heart diesease due to Anagrelide and I will monitor any heart changes  -Labs reviewed, her platelet count was elevated at 487 and I will increase Anagrelide to 2 cap (45m20min the morning and 1 cap in the evening  - We will check her labs every 6 weeks  - F/u in 3 months    2. Family history of cancer  -Closest living relative with cancer is her daughter, who was diagnosed with breast cancer at age 39.59 I previously recommended she discuss this with  her daughter and suggests she get genetic testing.  3. Health maintenance, disease prevention  -she continues f/u with PCP, GI   PLAN -Lab in 6 weeks  - Lab and f/u in 3 months  - Continue Anagrelide, increase to 26m in the morning and 0.550min the evening     No problem-specific Assessment & Plan notes found for this encounter.   No orders of the defined types were placed in this encounter.  All questions were answered. The patient knows to call the clinic with any problems, questions or concerns. No barriers to learning was detected. I spent 10 minutes counseling the patient face to face. The total time spent in the appointment was 15 minutes and more than 50% was on counseling and review of test results  I, DiManson Allanm acting as scribe for Dr. YaTruitt Merle I have reviewed the above documentation for accuracy and completeness, and I agree with the above.     YaTruitt MerleMD 01/28/2019

## 2019-01-24 ENCOUNTER — Other Ambulatory Visit: Payer: Medicare Other

## 2019-01-24 ENCOUNTER — Ambulatory Visit: Payer: Medicare Other | Admitting: Hematology

## 2019-01-28 ENCOUNTER — Telehealth: Payer: Self-pay | Admitting: *Deleted

## 2019-01-28 ENCOUNTER — Other Ambulatory Visit: Payer: Self-pay

## 2019-01-28 ENCOUNTER — Inpatient Hospital Stay: Payer: Medicare Other | Attending: Hematology

## 2019-01-28 ENCOUNTER — Inpatient Hospital Stay: Payer: Medicare Other | Admitting: Hematology

## 2019-01-28 ENCOUNTER — Telehealth: Payer: Self-pay | Admitting: Hematology

## 2019-01-28 ENCOUNTER — Encounter: Payer: Self-pay | Admitting: Hematology

## 2019-01-28 VITALS — BP 135/77 | HR 70 | Temp 98.0°F | Resp 17 | Ht 61.52 in | Wt 179.0 lb

## 2019-01-28 DIAGNOSIS — M1990 Unspecified osteoarthritis, unspecified site: Secondary | ICD-10-CM

## 2019-01-28 DIAGNOSIS — D473 Essential (hemorrhagic) thrombocythemia: Secondary | ICD-10-CM

## 2019-01-28 DIAGNOSIS — R600 Localized edema: Secondary | ICD-10-CM | POA: Insufficient documentation

## 2019-01-28 DIAGNOSIS — Z882 Allergy status to sulfonamides status: Secondary | ICD-10-CM

## 2019-01-28 DIAGNOSIS — Z79899 Other long term (current) drug therapy: Secondary | ICD-10-CM

## 2019-01-28 DIAGNOSIS — Z7982 Long term (current) use of aspirin: Secondary | ICD-10-CM

## 2019-01-28 DIAGNOSIS — Z803 Family history of malignant neoplasm of breast: Secondary | ICD-10-CM | POA: Insufficient documentation

## 2019-01-28 DIAGNOSIS — D75839 Thrombocytosis, unspecified: Secondary | ICD-10-CM

## 2019-01-28 LAB — CBC WITH DIFFERENTIAL (CANCER CENTER ONLY)
Abs Immature Granulocytes: 0.02 10*3/uL (ref 0.00–0.07)
Basophils Absolute: 0.1 10*3/uL (ref 0.0–0.1)
Basophils Relative: 1 %
Eosinophils Absolute: 0.1 10*3/uL (ref 0.0–0.5)
Eosinophils Relative: 1 %
HCT: 41 % (ref 36.0–46.0)
Hemoglobin: 13 g/dL (ref 12.0–15.0)
Immature Granulocytes: 0 %
Lymphocytes Relative: 28 %
Lymphs Abs: 1.8 10*3/uL (ref 0.7–4.0)
MCH: 31 pg (ref 26.0–34.0)
MCHC: 31.7 g/dL (ref 30.0–36.0)
MCV: 97.9 fL (ref 80.0–100.0)
Monocytes Absolute: 0.6 10*3/uL (ref 0.1–1.0)
Monocytes Relative: 9 %
Neutro Abs: 3.8 10*3/uL (ref 1.7–7.7)
Neutrophils Relative %: 61 %
Platelet Count: 487 10*3/uL — ABNORMAL HIGH (ref 150–400)
RBC: 4.19 MIL/uL (ref 3.87–5.11)
RDW: 12.9 % (ref 11.5–15.5)
WBC Count: 6.3 10*3/uL (ref 4.0–10.5)
nRBC: 0 % (ref 0.0–0.2)

## 2019-01-28 MED ORDER — ANAGRELIDE HCL 0.5 MG PO CAPS: ORAL_CAPSULE | ORAL | 0 refills | Status: DC

## 2019-01-28 NOTE — Telephone Encounter (Signed)
Gave avs and calendar ° °

## 2019-01-28 NOTE — Telephone Encounter (Signed)
Received call from [facility] stating that they do not have any anagrelide & that it has been on long term back order.  Discussed with Dr Burr Medico & she states pt will need to go back to [facility] when her supply of anagrelide runs out.   Called pt & she states that she doesn't want to go back to the hydrea although she has some left.  Encouraged to check with other pharmacies  to see if anyone has anagrelide & if not to call us before she runs out of her supply & we will discuss with Dr Burr Medico then.

## 2019-01-29 ENCOUNTER — Telehealth: Payer: Self-pay | Admitting: Hematology

## 2019-01-29 NOTE — Telephone Encounter (Signed)
I called pt and discussed about her medication supply.  With long outpatient pharmacy was not able to refill her anagrelide due to lack of supply, she has asked her local [facility] pharmacy and they may have supply in mid April.  Patient will continue anagrelide for now until it runs out in 2 weeks, then she will switch back to Hydrea 1000mg /day on MF, and 500mg  daily for the rest of week afterwards, she has about 30 tablets left.  I encouraged her to call me in mid April if she still could not get anagrelide refilled, then I will refill her Hydrea.  She agrees with the plan.  Truitt Merle  01/29/2019

## 2019-03-14 ENCOUNTER — Inpatient Hospital Stay: Payer: Medicare Other | Attending: Hematology

## 2019-03-14 ENCOUNTER — Other Ambulatory Visit: Payer: Self-pay

## 2019-03-14 DIAGNOSIS — D473 Essential (hemorrhagic) thrombocythemia: Secondary | ICD-10-CM | POA: Diagnosis present

## 2019-03-14 DIAGNOSIS — D75839 Thrombocytosis, unspecified: Secondary | ICD-10-CM

## 2019-03-14 LAB — CMP (CANCER CENTER ONLY)
ALT: 17 U/L (ref 0–44)
AST: 19 U/L (ref 15–41)
Albumin: 3.8 g/dL (ref 3.5–5.0)
Alkaline Phosphatase: 70 U/L (ref 38–126)
Anion gap: 10 (ref 5–15)
BUN: 14 mg/dL (ref 8–23)
CO2: 28 mmol/L (ref 22–32)
Calcium: 9.6 mg/dL (ref 8.9–10.3)
Chloride: 107 mmol/L (ref 98–111)
Creatinine: 0.9 mg/dL (ref 0.44–1.00)
GFR, Est AFR Am: >60 mL/min (ref >60)
GFR, Estimated: 60 mL/min — ABNORMAL LOW (ref >60)
Glucose, Bld: 76 mg/dL (ref 70–99)
Potassium: 3.9 mmol/L (ref 3.5–5.1)
Sodium: 145 mmol/L (ref 135–145)
Total Bilirubin: 0.4 mg/dL (ref 0.3–1.2)
Total Protein: 7.1 g/dL (ref 6.5–8.1)

## 2019-03-14 LAB — CBC WITH DIFFERENTIAL (CANCER CENTER ONLY)
Abs Immature Granulocytes: 0.01 10*3/uL (ref 0.00–0.07)
Basophils Absolute: 0.1 10*3/uL (ref 0.0–0.1)
Basophils Relative: 1 %
Eosinophils Absolute: 0.1 10*3/uL (ref 0.0–0.5)
Eosinophils Relative: 2 %
HCT: 41.6 % (ref 36.0–46.0)
Hemoglobin: 13.2 g/dL (ref 12.0–15.0)
Immature Granulocytes: 0 %
Lymphocytes Relative: 39 %
Lymphs Abs: 1.9 10*3/uL (ref 0.7–4.0)
MCH: 30.8 pg (ref 26.0–34.0)
MCHC: 31.7 g/dL (ref 30.0–36.0)
MCV: 97.2 fL (ref 80.0–100.0)
Monocytes Absolute: 0.4 10*3/uL (ref 0.1–1.0)
Monocytes Relative: 8 %
Neutro Abs: 2.4 10*3/uL (ref 1.7–7.7)
Neutrophils Relative %: 50 %
Platelet Count: 577 10*3/uL — ABNORMAL HIGH (ref 150–400)
RBC: 4.28 MIL/uL (ref 3.87–5.11)
RDW: 15.1 % (ref 11.5–15.5)
WBC Count: 4.8 10*3/uL (ref 4.0–10.5)
nRBC: 0 % (ref 0.0–0.2)

## 2019-03-17 ENCOUNTER — Telehealth: Payer: Self-pay | Admitting: *Deleted

## 2019-03-17 MED FILL — HYDROXYUREA 500 MG CAPSULE: 500 | 35 days supply | Qty: 60 | Fill #1

## 2019-03-17 NOTE — Telephone Encounter (Signed)
TCT patient regarding her hydrea.  Spoke with patient and and reviewed her hydrea dosing. She states she had not been taking it correctly but is now taking as Dr. Burr Medico has prescribed.  Reviewed latest lab results with her.  Pt voiced understanding.  No questions or concerns. Pt is aware of her appts in June 2020

## 2019-03-17 NOTE — Telephone Encounter (Signed)
-----   Message from Truitt Merle, MD sent at 03/16/2019  6:14 PM EDT ----- Please call pt to see if she is taking hydrea now and how she takes it (should be 1000mg  daily except 500mg  on MF). She run out of Anagrelide last month. Let her know her plt is elevated.   Thanks  Truitt Merle  03/16/2019

## 2019-04-21 MED FILL — HYDROXYUREA 500 MG CAPSULE: 500 | 35 days supply | Qty: 60 | Fill #1

## 2019-04-28 NOTE — Progress Notes (Signed)
[place]   [phone] [phone]) [phone]   Clinic Follow up Note   Patient Care Team: Lavone Orn, MD as PCP - General (Internal Medicine)  Date of Service:  05/01/2019  CHIEF COMPLAINT: F/u for essential Thrombocytosis, JAK2+  CURRENT THERAPY:  1.Hydrea 559m once daily starting 07/29/18, willincrease to5021mon Mondays and Fridays and100086mhe rest of the week on 08/24/2018.She responded very well.  2.Due topt'sconcerns of side effectsfrom hydrea, Switchhydreato Anagrelide on 11/2018.she started on 0.5mg53mice daily in 11/2018, increased to 1mg 78mand 0.5mg p12mn 01/28/2019. Held since 02/2019 due to back order. Restart at 1mg BI56mn 05/21/19.   INTERVAL HISTORY:  Danielle SHANDRELL BODAe for a follow up of ET. She presents to the clinic alone. She notes she is doing well. She feels she is gaining weight. She is taking hydrea still due to Anagrelide shortage. She is taking it 500mg On7mdays and Tuesday and 1000mg eve7mther day.     REVIEW OF SYSTEMS:   Constitutional: Denies fevers, chills or abnormal weight loss (+) weight gain  Eyes: Denies blurriness of vision Ears, nose, mouth, throat, and face: Denies mucositis or sore throat Respiratory: Denies cough, dyspnea or wheezes Cardiovascular: Denies palpitation, chest discomfort or lower extremity swelling Gastrointestinal:  Denies nausea, heartburn or change in bowel habits Skin: Denies abnormal skin rashes Lymphatics: Denies new lymphadenopathy or easy bruising Neurological:Denies numbness, tingling or new weaknesses Behavioral/Psych: Mood is stable, no new changes  All other systems were reviewed with the patient and are negative.  MEDICAL HISTORY:  Past Medical History:  Diagnosis Date  . Arthritis    knees  . Goiter    not a problem  . History of hiatal hernia   . Hx of seasonal allergies   . Irritable bowel syndrome     SURGICAL HISTORY: Past Surgical History:  Procedure  Laterality Date  . ABDOMINAL HYSTERECTOMY    . COLONOSCOPY W/ POLYPECTOMY    . COLONOSCOPY WITH PROPOFOL N/A 11/08/2015   Procedure: COLONOSCOPY WITH PROPOFOL;  Surgeon: Martin K Garlan [place]cation: [facility];  Service: Endoscopy;  Laterality: N/A;  . LAPAROSCOPIC PARTIAL COLECTOMY N/A 02/12/2015   Procedure: DIAGNOSTIC LAPAROSCOPY WITH LYSIS OF ADHESIONS;  Surgeon: Steven GrMichael Bostoncation: [facility];  Service: General;  Laterality: N/A;-"small bowel obstruction"  . LEFT OOPHORECTOMY      I have reviewed the social history and family history with the patient and they are unchanged from previous note.  ALLERGIES:  is allergic to sulfa antibiotics.  MEDICATIONS:  Current Outpatient Medications  Medication Sig Dispense Refill  . Ascorbic Acid (VITAMIN C) 1000 MG tablet Take 1,000 mg by mouth daily.    . aspirinMarland KitchenEC 81 MG tablet Take 81 mg by mouth daily.    . B Complex Vitamins (B COMPLEX 1 PO) B Complex    . Cranberry 500 MG CAPS Take 500 mg by mouth daily.    . Evening Primrose Oil 500 MG CAPS Take 1 capsule by mouth daily.    . GINKGO Marland KitchenILOBA PO Take 120 mg by mouth daily.    . Ginseng 100 MG CAPS Take 100 mg by mouth daily.    . hydroxyurea (HYDREA) 500 MG capsule Take by mouth as directed. Take 500 mg on Monday and  Friday.   1000 mg all other days.    . Magnesium Hydroxide (MILK OF MAGNESIA PO) Take by mouth daily as needed.    . Omega-3 Fatty Acids (  FISH OIL) 1200 MG CAPS Take 1 capsule by mouth daily.    . Probiotic Product (ALIGN PO) Take by mouth daily.    Marland Kitchen anagrelide (AGRYLIN) 1 MG capsule Take 1 capsule (1 mg total) by mouth 2 (two) times daily. 60 capsule 3   No current facility-administered medications for this visit.     PHYSICAL EXAMINATION: ECOG PERFORMANCE STATUS: 0 - Asymptomatic  Vitals:   05/01/19 0833  BP: 131/69  Pulse: 65  Resp: 18  Temp: 98.6 F (37 C)  SpO2: 98%   Filed Weights   05/01/19 0833  Weight: 185 lb 4.8 oz (84.1 kg)     GENERAL:alert, no distress and comfortable SKIN: skin color, texture, turgor are normal, no rashes or significant lesions EYES: normal, Conjunctiva are pink and non-injected, sclera clear  NECK: supple, thyroid normal size, non-tender, without nodularity LYMPH:  no palpable lymphadenopathy in the cervical, axillary  LUNGS: clear to auscultation and percussion with normal breathing effort HEART: regular rate & rhythm and no murmurs and no lower extremity edema ABDOMEN:abdomen soft, non-tender and normal bowel sounds Musculoskeletal:no cyanosis of digits and no clubbing  NEURO: alert & oriented x 3 with fluent speech, no focal motor/sensory deficits  LABORATORY DATA:  I have reviewed the data as listed CBC Latest Ref Rng & Units 05/01/2019 03/14/2019 01/28/2019  WBC 4.0 - 10.5 K/uL 3.8(L) 4.8 6.3  Hemoglobin 12.0 - 15.0 g/dL 12.9 13.2 13.0  Hematocrit 36.0 - 46.0 % 39.6 41.6 41.0  Platelets 150 - 400 K/uL 395 577(H) 487(H)     CMP Latest Ref Rng & Units 05/01/2019 03/14/2019 11/01/2018  Glucose 70 - 99 mg/dL 96 76 85  BUN 8 - 23 mg/dL 11 14 14   Creatinine 0.44 - 1.00 mg/dL 0.84 0.90 1.00  Sodium 135 - 145 mmol/L 143 145 144  Potassium 3.5 - 5.1 mmol/L 4.2 3.9 4.2  Chloride 98 - 111 mmol/L 108 107 108  CO2 22 - 32 mmol/L 26 28 29   Calcium 8.9 - 10.3 mg/dL 9.5 9.6 10.1  Total Protein 6.5 - 8.1 g/dL 6.8 7.1 7.0  Total Bilirubin 0.3 - 1.2 mg/dL 0.4 0.4 0.5  Alkaline Phos 38 - 126 U/L 65 70 67  AST 15 - 41 U/L 21 19 20   ALT 0 - 44 U/L 17 17 13       RADIOGRAPHIC STUDIES: I have personally reviewed the radiological images as listed and agreed with the findings in the report. No results found.   ASSESSMENT & PLAN:  Danielle Oconnell is a 84 y.o. female with   1. Essential Thrombocythemia, JAK2(+) -Her 07/23/18 Bone Marrow biopsy showed hypercellular bone marrow, with megakaryocyte proliferation and her genetic mutation testing results showed JAK2(+), this is consistent with essential  thrombocythemia. -Shepreviously responded well to hydrea -Due to pt concernsof side effects from Hydrea, specifically weight gain, risk of leukemia, she wanted to stop Hydrea and start Anagrelide -she has restarted Hydrea 2 months ago due to Anagrelide back orer  -Labs reviewed, CBC and CMP WNL except WBC 3.8, ANC 1.6 -I discussed she responded well to Hydrea. I also discussed Anagrelide is available again, but she is fine to continue hydrea as it is controlling her counts. She is hesitant about continuing hydrea as this is a chemo pill.  -After lengthy discussion, she will complete her Hydrea in 04/2019 and restart Anagrelide on 05/21/19 at 28m BID. She is agreeable . -F/u in 4 months   2. Family history of cancer  -Closest living relative  with cancer is her daughter, who was diagnosed with breast cancer at age 52. - I previously recommended she discuss this with her daughter and suggests she get genetic testing.  3. Health maintenance, disease prevention  -she continues f/u with PCP, GI -Patient has been gaining weight, she feels from [facility]. I encouraged her to reduce carbohydrate in diet and continue with more fruits and vegetables. I also encouraged her to maintain being physically active.    PLAN -she agrees to continue Hydrea 574m on Mondays and Fridays and 10046mthe rest of the week for the rest of June  -then switch back to Anagrelide 05/21/19 at 68m97mID, I refilled for her today  -lab in 2 months  -Lab and f/u in 4 months     No problem-specific Assessment & Plan notes found for this encounter.   No orders of the defined types were placed in this encounter.  All questions were answered. The patient knows to call the clinic with any problems, questions or concerns. No barriers to learning was detected. I spent 20 minutes counseling the patient face to face. The total time spent in the appointment was 25 minutes and more than 50% was on counseling and review of test results      YanTruitt MerleD 05/01/2019   I, AmoJoslyn Devonm acting as scribe for YanTruitt MerleD.   I have reviewed the above documentation for accuracy and completeness, and I agree with the above.

## 2019-05-01 ENCOUNTER — Encounter: Payer: Self-pay | Admitting: Hematology

## 2019-05-01 ENCOUNTER — Other Ambulatory Visit: Payer: Self-pay

## 2019-05-01 ENCOUNTER — Inpatient Hospital Stay: Payer: Medicare Other | Attending: Hematology | Admitting: Hematology

## 2019-05-01 ENCOUNTER — Inpatient Hospital Stay: Payer: Medicare Other

## 2019-05-01 VITALS — BP 131/69 | HR 65 | Temp 98.6°F | Resp 18 | Ht 61.0 in | Wt 185.3 lb

## 2019-05-01 DIAGNOSIS — D473 Essential (hemorrhagic) thrombocythemia: Secondary | ICD-10-CM | POA: Diagnosis not present

## 2019-05-01 DIAGNOSIS — D75839 Thrombocytosis, unspecified: Secondary | ICD-10-CM

## 2019-05-01 DIAGNOSIS — Z7982 Long term (current) use of aspirin: Secondary | ICD-10-CM | POA: Insufficient documentation

## 2019-05-01 DIAGNOSIS — Z853 Personal history of malignant neoplasm of breast: Secondary | ICD-10-CM | POA: Diagnosis not present

## 2019-05-01 DIAGNOSIS — M1990 Unspecified osteoarthritis, unspecified site: Secondary | ICD-10-CM | POA: Diagnosis not present

## 2019-05-01 DIAGNOSIS — Z79899 Other long term (current) drug therapy: Secondary | ICD-10-CM | POA: Diagnosis not present

## 2019-05-01 LAB — CBC WITH DIFFERENTIAL (CANCER CENTER ONLY)
Abs Immature Granulocytes: 0.01 10*3/uL (ref 0.00–0.07)
Basophils Absolute: 0 10*3/uL (ref 0.0–0.1)
Basophils Relative: 1 %
Eosinophils Absolute: 0.1 10*3/uL (ref 0.0–0.5)
Eosinophils Relative: 2 %
HCT: 39.6 % (ref 36.0–46.0)
Hemoglobin: 12.9 g/dL (ref 12.0–15.0)
Immature Granulocytes: 0 %
Lymphocytes Relative: 46 %
Lymphs Abs: 1.7 10*3/uL (ref 0.7–4.0)
MCH: 31.5 pg (ref 26.0–34.0)
MCHC: 32.6 g/dL (ref 30.0–36.0)
MCV: 96.6 fL (ref 80.0–100.0)
Monocytes Absolute: 0.3 10*3/uL (ref 0.1–1.0)
Monocytes Relative: 9 %
Neutro Abs: 1.6 10*3/uL — ABNORMAL LOW (ref 1.7–7.7)
Neutrophils Relative %: 42 %
Platelet Count: 395 10*3/uL (ref 150–400)
RBC: 4.1 MIL/uL (ref 3.87–5.11)
RDW: 18.1 % — ABNORMAL HIGH (ref 11.5–15.5)
WBC Count: 3.8 10*3/uL — ABNORMAL LOW (ref 4.0–10.5)
nRBC: 0 % (ref 0.0–0.2)

## 2019-05-01 LAB — CMP (CANCER CENTER ONLY)
ALT: 17 U/L (ref 0–44)
AST: 21 U/L (ref 15–41)
Albumin: 4 g/dL (ref 3.5–5.0)
Alkaline Phosphatase: 65 U/L (ref 38–126)
Anion gap: 9 (ref 5–15)
BUN: 11 mg/dL (ref 8–23)
CO2: 26 mmol/L (ref 22–32)
Calcium: 9.5 mg/dL (ref 8.9–10.3)
Chloride: 108 mmol/L (ref 98–111)
Creatinine: 0.84 mg/dL (ref 0.44–1.00)
GFR, Est AFR Am: >60 mL/min (ref >60)
GFR, Estimated: >60 mL/min (ref >60)
Glucose, Bld: 96 mg/dL (ref 70–99)
Potassium: 4.2 mmol/L (ref 3.5–5.1)
Sodium: 143 mmol/L (ref 135–145)
Total Bilirubin: 0.4 mg/dL (ref 0.3–1.2)
Total Protein: 6.8 g/dL (ref 6.5–8.1)

## 2019-05-01 MED ORDER — ANAGRELIDE HCL 1 MG PO CAPS: 1.0000 mg | ORAL_CAPSULE | Freq: Two times a day (BID) | ORAL | 3 refills | Status: DC

## 2019-05-02 ENCOUNTER — Telehealth: Payer: Self-pay | Admitting: Hematology

## 2019-05-02 NOTE — Telephone Encounter (Signed)
Scheduled appt per 6/11 los. A calendar will be mailed out. °

## 2019-05-03 ENCOUNTER — Other Ambulatory Visit: Payer: Self-pay | Admitting: Hematology

## 2019-05-19 MED FILL — ANAGRELIDE HCL 1 MG CAPSULE: 1 | 30 days supply | Qty: 60 | Fill #0

## 2019-07-01 ENCOUNTER — Telehealth: Payer: Self-pay

## 2019-07-01 ENCOUNTER — Inpatient Hospital Stay: Payer: Medicare Other | Attending: Hematology

## 2019-07-01 ENCOUNTER — Other Ambulatory Visit: Payer: Self-pay

## 2019-07-01 DIAGNOSIS — D75839 Thrombocytosis, unspecified: Secondary | ICD-10-CM

## 2019-07-01 DIAGNOSIS — D473 Essential (hemorrhagic) thrombocythemia: Secondary | ICD-10-CM | POA: Diagnosis present

## 2019-07-01 LAB — CBC WITH DIFFERENTIAL (CANCER CENTER ONLY)
Abs Immature Granulocytes: 0.02 10*3/uL (ref 0.00–0.07)
Basophils Absolute: 0.1 10*3/uL (ref 0.0–0.1)
Basophils Relative: 1 %
Eosinophils Absolute: 0.1 10*3/uL (ref 0.0–0.5)
Eosinophils Relative: 2 %
HCT: 38.2 % (ref 36.0–46.0)
Hemoglobin: 12.5 g/dL (ref 12.0–15.0)
Immature Granulocytes: 0 %
Lymphocytes Relative: 26 %
Lymphs Abs: 1.8 10*3/uL (ref 0.7–4.0)
MCH: 33 pg (ref 26.0–34.0)
MCHC: 32.7 g/dL (ref 30.0–36.0)
MCV: 100.8 fL — ABNORMAL HIGH (ref 80.0–100.0)
Monocytes Absolute: 0.6 10*3/uL (ref 0.1–1.0)
Monocytes Relative: 9 %
Neutro Abs: 4.3 10*3/uL (ref 1.7–7.7)
Neutrophils Relative %: 62 %
Platelet Count: 338 10*3/uL (ref 150–400)
RBC: 3.79 MIL/uL — ABNORMAL LOW (ref 3.87–5.11)
RDW: 14.2 % (ref 11.5–15.5)
WBC Count: 6.9 10*3/uL (ref 4.0–10.5)
nRBC: 0 % (ref 0.0–0.2)

## 2019-07-01 MED FILL — ANAGRELIDE HCL 1 MG CAPSULE: 1 | 30 days supply | Qty: 60 | Fill #1

## 2019-07-01 NOTE — Telephone Encounter (Signed)
-----   Message from Alla Feeling, NP sent at 07/01/2019  4:09 PM EDT ----- Please let her know counts are stable. Continue anagrelide same dose. Thanks, lacie

## 2019-07-01 NOTE — Telephone Encounter (Signed)
TC to Pt message relayed to Pt. about labs and to continue same dose of anagrelide. Pt. verbalized understanding. No further problems or concerns noted.

## 2019-08-28 NOTE — Progress Notes (Signed)
Danielle Oconnell   Telephone:([phone] Fax:(336) [phone]   Clinic Follow up Note   Patient Care Team: Lavone Orn, MD as PCP - General (Internal Medicine)  Date of Service:  09/01/2019  CHIEF COMPLAINT: F/u for essential Thrombocytosis, JAK2+  CURRENT THERAPY:  1.Hydrea 562m once daily starting 07/29/18, willincrease to5079mon Mondays and Fridays and1000100mhe rest of the week on 08/24/2018.She responded very well. Stopped in 04/2019 due to her concerns about risk of leukemia. Restart 09/02/19 at 500m89m Mondays/Fridays and 1000mg94m rest of the week.  2.Due topt'sconcerns of side effectsfrom hydrea, Switchhydreato Anagrelideon 11/2018.she started on 0.5mg t31me daily in 11/2018, increased to 1mg am18md 0.5mg pm 73m3/08/2019. Held since 02/2019 due to back order. Restart at 1mg BID 33m7/1/20. Stopped 09/02/19 due to fatigue and heart palpitations.   INTERVAL HISTORY:  Samiah H CHANISE HABECKfor a follow up ET. She presents to the clinic alone. She notes she is doing well but gets tired more easily since restarting anagrelide. She did not feel this fatigued with Hydrea.  She also has concern about her elevated BP. She has not been checking it at home. She notes occasional usual HA but no chest pain. She has been on Anagrelide 1mg twice72mday and tolerating well. She also notes having heart palpitations recently. She did her PCP once a year.     REVIEW OF SYSTEMS:   Constitutional: Denies fevers, chills or abnormal weight loss (+) Fatigue  (+)Occasional HA Eyes: Denies blurriness of vision Ears, nose, mouth, throat, and face: Denies mucositis or sore throat Respiratory: Denies cough, dyspnea or wheezes Cardiovascular: Denies chest discomfort or lower extremity swelling (+) Heart palpitations  Gastrointestinal:  Denies nausea, heartburn or change in bowel habits Skin: Denies abnormal skin rashes Lymphatics: Denies new lymphadenopathy or easy bruising  Neurological:Denies numbness, tingling or new weaknesses Behavioral/Psych: Mood is stable, no new changes  All other systems were reviewed with the patient and are negative.  MEDICAL HISTORY:  Past Medical History:  Diagnosis Date  . Arthritis    knees  . Goiter    not a problem  . History of hiatal hernia   . Hx of seasonal allergies   . Irritable bowel syndrome     SURGICAL HISTORY: Past Surgical History:  Procedure Laterality Date  . ABDOMINAL HYSTERECTOMY    . COLONOSCOPY W/ POLYPECTOMY    . COLONOSCOPY WITH PROPOFOL N/A 11/08/2015   Procedure: COLONOSCOPY WITH PROPOFOL;  Surgeon: Martin K JGarlan Fairation: [facility];  Service: Endoscopy;  Laterality: N/A;  . LAPAROSCOPIC PARTIAL COLECTOMY N/A 02/12/2015   Procedure: DIAGNOSTIC LAPAROSCOPY WITH LYSIS OF ADHESIONS;  Surgeon: Steven GroMichael Bostonation: [facility];  Service: General;  Laterality: N/A;-"small bowel obstruction"  . LEFT OOPHORECTOMY      I have reviewed the social history and family history with the patient and they are unchanged from previous note.  ALLERGIES:  is allergic to sulfa antibiotics.  MEDICATIONS:  Current Outpatient Medications  Medication Sig Dispense Refill  . Ascorbic Acid (VITAMIN C) 1000 MG tablet Take 1,000 mg by mouth daily.    . aspirin Marland KitchenC 81 MG tablet Take 81 mg by mouth daily.    . Evening Primrose Oil 500 MG CAPS Take 1 capsule by mouth daily.    . GINKGO BMarland KitchenLOBA PO Take 120 mg by mouth daily.    . Magnesium Hydroxide (MILK OF MAGNESIA PO) Take by mouth daily as needed.    .Marland Kitchen  Omega-3 Fatty Acids (FISH OIL) 1200 MG CAPS Take 1 capsule by mouth daily.    . B Complex Vitamins (B COMPLEX 1 PO) B Complex    . Cranberry 500 MG CAPS Take 500 mg by mouth daily.    . Ginseng 100 MG CAPS Take 100 mg by mouth daily.    . hydroxyurea (HYDREA) 500 MG capsule Take 500 mg on Monday and  Friday.   1000 mg all other days. 50 capsule 2  . Probiotic Product (ALIGN PO) Take by mouth daily.      No current facility-administered medications for this visit.     PHYSICAL EXAMINATION: ECOG PERFORMANCE STATUS: 1 - Symptomatic but completely ambulatory  Vitals:   09/01/19 1031  BP: (!) 162/75  Pulse: 69  Resp: 17  Temp: 98.3 F (36.8 C)  SpO2: 98%   Filed Weights   09/01/19 1031  Weight: 188 lb 14.4 oz (85.7 kg)    GENERAL:alert, no distress and comfortable SKIN: skin color, texture, turgor are normal, no rashes or significant lesions EYES: normal, Conjunctiva are pink and non-injected, sclera clear  NECK: supple, thyroid normal size, non-tender, without nodularity LYMPH:  no palpable lymphadenopathy in the cervical, axillary  LUNGS: clear to auscultation and percussion with normal breathing effort HEART: regular rate & rhythm and no murmurs and no lower extremity edema ABDOMEN:abdomen soft, non-tender and normal bowel sounds Musculoskeletal:no cyanosis of digits and no clubbing  NEURO: alert & oriented x 3 with fluent speech, no focal motor/sensory deficits  LABORATORY DATA:  I have reviewed the data as listed CBC Latest Ref Rng & Units 09/01/2019 07/01/2019 05/01/2019  WBC 4.0 - 10.5 K/uL 7.0 6.9 3.8(L)  Hemoglobin 12.0 - 15.0 g/dL 12.4 12.5 12.9  Hematocrit 36.0 - 46.0 % 38.1 38.2 39.6  Platelets 150 - 400 K/uL 501(H) 338 395     CMP Latest Ref Rng & Units 09/01/2019 05/01/2019 03/14/2019  Glucose 70 - 99 mg/dL 96 96 76  BUN 8 - 23 mg/dL 14 11 14   Creatinine 0.44 - 1.00 mg/dL 0.95 0.84 0.90  Sodium 135 - 145 mmol/L 144 143 145  Potassium 3.5 - 5.1 mmol/L 3.9 4.2 3.9  Chloride 98 - 111 mmol/L 107 108 107  CO2 22 - 32 mmol/L 28 26 28   Calcium 8.9 - 10.3 mg/dL 9.5 9.5 9.6  Total Protein 6.5 - 8.1 g/dL 6.8 6.8 7.1  Total Bilirubin 0.3 - 1.2 mg/dL 0.4 0.4 0.4  Alkaline Phos 38 - 126 U/L 73 65 70  AST 15 - 41 U/L 20 21 19   ALT 0 - 44 U/L 14 17 17       RADIOGRAPHIC STUDIES: I have personally reviewed the radiological images as listed and agreed with the  findings in the report. No results found.   ASSESSMENT & PLAN:  Danielle Oconnell is a 84 y.o. female with   1. Essential Thrombocythemia, JAK2(+) -Her 07/23/18 Bone Marrow biopsy showed hypercellular bone marrow, with megakaryocyte proliferation and her genetic mutation testing results showed JAK2(+), this is consistent with essential thrombocythemia. -Shepreviously responded well to hydrea -Due to ptconcernsof side effects from Hydrea, specifically weight gain, risk of leukemia,she wanted to stopHydreaand start Anagrelide -She was on Hydrea for 2 months due to anagrelide back order. She restarted Anagrelide 38m BID in 05/2019.  -Since restarting Anegrelide she has been fatigued and having heart palpitations. I discussed there is a risk of developing heart disease from Anegrelide. [facility]. Plan  to restart 569m Monday and Friday and 10067mthe rest of the week. I will call in today.  -CBC and CMP today show plt 501K -f/u in 5 months with lab in 2 months  -I encouraged her to have flu shot with her PCP  2. Family history of cancer  -Closest living relative with cancer is her daughter, who was diagnosed with breast cancer at age 82-I previously recommended she discuss this with her daughter and suggests she get genetic testing.  3. Health maintenance, disease prevention  -she continues f/u with PCP, GI -Patient has been gaining weight, she feels [facility] encouraged her to reduce carbohydrate in diet and continue with more fruits and vegetables. I also encouraged her to maintain being physically active.  4. Elevated BP  -BP at  162/75 today (09/01/19). She denies chest pain but notes occasional mild HA. She notes heart palpitations in the morning. Will recheck her BP in clinic today.  -I encouraged her to start checking her BP at home at least twice a week.     PLAN -Plan to switch Anegrelide to hydrea 50070mn Mondays and  Fridays and 1000m56me rest of the week starting this week -Will request lab to be done in 2 months at her local lab facility and sent to our clinic.  -Lab and f/u in 5 months    No problem-specific Assessment & Plan notes found for this encounter.   Orders Placed This Encounter  Procedures  . CBC with Differential (Cancer Center Only)    Standing Status:   Standing    Number of Occurrences:   50    Standing Expiration Date:   08/31/2024   All questions were answered. The patient knows to call the clinic with any problems, questions or concerns. No barriers to learning was detected. I spent 15 minutes counseling the patient face to face. The total time spent in the appointment was 20 minutes and more than 50% was on counseling and review of test results     Jasneet Schobert Truitt Merle 09/01/2019   I, AmoyJoslyn Devon acting as scribe for Anchor Dwan Truitt Merle.   I have reviewed the above documentation for accuracy and completeness, and I agree with the above.

## 2019-09-01 ENCOUNTER — Inpatient Hospital Stay: Payer: Medicare Other | Attending: Hematology

## 2019-09-01 ENCOUNTER — Other Ambulatory Visit: Payer: Self-pay

## 2019-09-01 ENCOUNTER — Inpatient Hospital Stay (HOSPITAL_BASED_OUTPATIENT_CLINIC_OR_DEPARTMENT_OTHER): Payer: Medicare Other | Admitting: Hematology

## 2019-09-01 ENCOUNTER — Encounter: Payer: Self-pay | Admitting: Hematology

## 2019-09-01 ENCOUNTER — Inpatient Hospital Stay: Payer: Medicare Other

## 2019-09-01 VITALS — BP 162/75 | HR 69 | Temp 98.3°F | Resp 17 | Ht 61.0 in | Wt 188.9 lb

## 2019-09-01 DIAGNOSIS — R519 Headache, unspecified: Secondary | ICD-10-CM | POA: Insufficient documentation

## 2019-09-01 DIAGNOSIS — K589 Irritable bowel syndrome without diarrhea: Secondary | ICD-10-CM | POA: Diagnosis not present

## 2019-09-01 DIAGNOSIS — D473 Essential (hemorrhagic) thrombocythemia: Secondary | ICD-10-CM

## 2019-09-01 DIAGNOSIS — R030 Elevated blood-pressure reading, without diagnosis of hypertension: Secondary | ICD-10-CM | POA: Diagnosis not present

## 2019-09-01 DIAGNOSIS — M1990 Unspecified osteoarthritis, unspecified site: Secondary | ICD-10-CM | POA: Diagnosis not present

## 2019-09-01 DIAGNOSIS — R5383 Other fatigue: Secondary | ICD-10-CM | POA: Insufficient documentation

## 2019-09-01 DIAGNOSIS — Z79899 Other long term (current) drug therapy: Secondary | ICD-10-CM | POA: Diagnosis not present

## 2019-09-01 DIAGNOSIS — Z7982 Long term (current) use of aspirin: Secondary | ICD-10-CM | POA: Diagnosis not present

## 2019-09-01 DIAGNOSIS — R002 Palpitations: Secondary | ICD-10-CM | POA: Diagnosis not present

## 2019-09-01 LAB — CMP (CANCER CENTER ONLY)
ALT: 14 U/L (ref 0–44)
AST: 20 U/L (ref 15–41)
Albumin: 3.7 g/dL (ref 3.5–5.0)
Alkaline Phosphatase: 73 U/L (ref 38–126)
Anion gap: 9 (ref 5–15)
BUN: 14 mg/dL (ref 8–23)
CO2: 28 mmol/L (ref 22–32)
Calcium: 9.5 mg/dL (ref 8.9–10.3)
Chloride: 107 mmol/L (ref 98–111)
Creatinine: 0.95 mg/dL (ref 0.44–1.00)
GFR, Est AFR Am: >60 mL/min (ref >60)
GFR, Estimated: 56 mL/min — ABNORMAL LOW (ref >60)
Glucose, Bld: 96 mg/dL (ref 70–99)
Potassium: 3.9 mmol/L (ref 3.5–5.1)
Sodium: 144 mmol/L (ref 135–145)
Total Bilirubin: 0.4 mg/dL (ref 0.3–1.2)
Total Protein: 6.8 g/dL (ref 6.5–8.1)

## 2019-09-01 LAB — CBC WITH DIFFERENTIAL (CANCER CENTER ONLY)
Abs Immature Granulocytes: 0.01 10*3/uL (ref 0.00–0.07)
Basophils Absolute: 0 10*3/uL (ref 0.0–0.1)
Basophils Relative: 1 %
Eosinophils Absolute: 0.2 10*3/uL (ref 0.0–0.5)
Eosinophils Relative: 3 %
HCT: 38.1 % (ref 36.0–46.0)
Hemoglobin: 12.4 g/dL (ref 12.0–15.0)
Immature Granulocytes: 0 %
Lymphocytes Relative: 33 %
Lymphs Abs: 2.3 10*3/uL (ref 0.7–4.0)
MCH: 30.6 pg (ref 26.0–34.0)
MCHC: 32.5 g/dL (ref 30.0–36.0)
MCV: 94.1 fL (ref 80.0–100.0)
Monocytes Absolute: 0.6 10*3/uL (ref 0.1–1.0)
Monocytes Relative: 8 %
Neutro Abs: 3.9 10*3/uL (ref 1.7–7.7)
Neutrophils Relative %: 55 %
Platelet Count: 501 10*3/uL — ABNORMAL HIGH (ref 150–400)
RBC: 4.05 MIL/uL (ref 3.87–5.11)
RDW: 13.5 % (ref 11.5–15.5)
WBC Count: 7 10*3/uL (ref 4.0–10.5)
nRBC: 0 % (ref 0.0–0.2)

## 2019-09-01 MED ORDER — HYDROXYUREA 500 MG PO CAPS: ORAL_CAPSULE | ORAL | 2 refills | Status: DC

## 2019-09-02 ENCOUNTER — Encounter: Payer: Self-pay | Admitting: Hematology

## 2019-09-02 ENCOUNTER — Other Ambulatory Visit: Payer: Self-pay | Admitting: Internal Medicine

## 2019-09-02 ENCOUNTER — Telehealth: Payer: Self-pay | Admitting: Hematology

## 2019-09-02 ENCOUNTER — Telehealth: Payer: Self-pay

## 2019-09-02 DIAGNOSIS — E2839 Other primary ovarian failure: Secondary | ICD-10-CM

## 2019-09-02 NOTE — Telephone Encounter (Signed)
Spoke with patient per Dr. Burr Medico she will have labs in 2 months with PCP.  She states she will do that and see [place] back in 5 months.  Encouraged her to call back if she has any questions or concerns.

## 2019-09-02 NOTE — Telephone Encounter (Signed)
-----   Message from Truitt Merle, MD sent at 09/02/2019 10:53 AM EDT ----- Regarding: RE: Platelet Count She is suppose to have lab in 2 months at local, I gave her prescription, please remind her, thanks  ----- Message ----- From: Jonnie Finner, RN Sent: 09/02/2019  10:29 AM EDT To: Truitt Merle, MD Subject: Platelet Count                                 She called for her platelet count result.  Her platelet count from yesterday is 501.  I saw you switched her back to Hydrea.  Do you want to repeat her labs sooner than 5 months indicated in your LOS?  Malachy Mood

## 2019-09-02 NOTE — Telephone Encounter (Signed)
Scheduled appt per 10/12 los. ° °Sent a staff message to get a calendar mailed out. °

## 2019-11-03 ENCOUNTER — Other Ambulatory Visit: Payer: Self-pay

## 2019-11-03 ENCOUNTER — Inpatient Hospital Stay: Payer: Medicare Other | Attending: Hematology

## 2019-11-03 DIAGNOSIS — D473 Essential (hemorrhagic) thrombocythemia: Secondary | ICD-10-CM | POA: Diagnosis present

## 2019-11-03 LAB — CMP (CANCER CENTER ONLY)
ALT: 15 U/L (ref 0–44)
AST: 19 U/L (ref 15–41)
Albumin: 4.1 g/dL (ref 3.5–5.0)
Alkaline Phosphatase: 71 U/L (ref 38–126)
Anion gap: 11 (ref 5–15)
BUN: 14 mg/dL (ref 8–23)
CO2: 27 mmol/L (ref 22–32)
Calcium: 9.9 mg/dL (ref 8.9–10.3)
Chloride: 106 mmol/L (ref 98–111)
Creatinine: 1.03 mg/dL — ABNORMAL HIGH (ref 0.44–1.00)
GFR, Est AFR Am: 59 mL/min — ABNORMAL LOW (ref >60)
GFR, Estimated: 51 mL/min — ABNORMAL LOW (ref >60)
Glucose, Bld: 106 mg/dL — ABNORMAL HIGH (ref 70–99)
Potassium: 4 mmol/L (ref 3.5–5.1)
Sodium: 144 mmol/L (ref 135–145)
Total Bilirubin: 0.3 mg/dL (ref 0.3–1.2)
Total Protein: 7.4 g/dL (ref 6.5–8.1)

## 2019-11-03 LAB — CBC WITH DIFFERENTIAL (CANCER CENTER ONLY)
Abs Immature Granulocytes: 0.01 10*3/uL (ref 0.00–0.07)
Basophils Absolute: 0 10*3/uL (ref 0.0–0.1)
Basophils Relative: 1 %
Eosinophils Absolute: 0.1 10*3/uL (ref 0.0–0.5)
Eosinophils Relative: 1 %
HCT: 41.8 % (ref 36.0–46.0)
Hemoglobin: 13.6 g/dL (ref 12.0–15.0)
Immature Granulocytes: 0 %
Lymphocytes Relative: 36 %
Lymphs Abs: 2.1 10*3/uL (ref 0.7–4.0)
MCH: 31.2 pg (ref 26.0–34.0)
MCHC: 32.5 g/dL (ref 30.0–36.0)
MCV: 95.9 fL (ref 80.0–100.0)
Monocytes Absolute: 0.5 10*3/uL (ref 0.1–1.0)
Monocytes Relative: 8 %
Neutro Abs: 3.3 10*3/uL (ref 1.7–7.7)
Neutrophils Relative %: 54 %
Platelet Count: 466 10*3/uL — ABNORMAL HIGH (ref 150–400)
RBC: 4.36 MIL/uL (ref 3.87–5.11)
RDW: 18.6 % — ABNORMAL HIGH (ref 11.5–15.5)
WBC Count: 5.9 10*3/uL (ref 4.0–10.5)
nRBC: 0 % (ref 0.0–0.2)

## 2019-11-04 ENCOUNTER — Telehealth: Payer: Self-pay

## 2019-11-04 NOTE — Telephone Encounter (Signed)
TC to pt per Dr Burr Medico to let her know her lab result, plt improved, continue current hydrea dose, no other concerns. Patient verbalized understanding. No further problems or concerns at this time.

## 2019-11-10 ENCOUNTER — Telehealth: Payer: Self-pay

## 2019-11-10 NOTE — Telephone Encounter (Signed)
Patient calls with complain of SOB and tiredness, she states she has put on a lot of weight and thinks that is why.  I explained to her need to call her PCP office to see if they can see her.  Dr. Burr Medico is out of the office this week.  She verbalized an understanding.

## 2019-11-20 ENCOUNTER — Other Ambulatory Visit: Payer: Self-pay

## 2019-11-20 ENCOUNTER — Ambulatory Visit
Admission: RE | Admit: 2019-11-20 | Discharge: 2019-11-20 | Disposition: A | Discharge status: Home / Self Care | Payer: Medicare Other | Source: Ambulatory Visit | Attending: Internal Medicine | Admitting: Internal Medicine

## 2019-11-20 DIAGNOSIS — E2839 Other primary ovarian failure: Secondary | ICD-10-CM

## 2019-12-04 ENCOUNTER — Other Ambulatory Visit: Payer: Self-pay | Admitting: Hematology

## 2020-01-23 NOTE — Progress Notes (Signed)
Danielle Oconnell   Telephone:(336) [phone] Fax:(336) [phone]   Clinic Follow up Note   Patient Care Team: Lavone Orn, MD as PCP - General (Internal Medicine)  Date of Service:  01/30/2020  CHIEF COMPLAINT: F/u for essential Thrombocytosis, JAK2+   CURRENT THERAPY:  Hydrea 534m once daily starting 07/29/18, willincrease to5017mon Mondays and Fridays and100057mhe rest of the week on 08/24/2018.She responded very well.  -Due topt'sconcerns of side effectsfrom hydrea, Switchhydreato Anagrelideon 11/2018.she started on 0.5mg64mice daily in 11/2018, increased to 1mg 73mand 0.5mg p64mn 01/28/2019. Held since 02/2019 due to back order    -Started Hydrea in 02/2019 and stopped in 04/2019 due to her concerns about risk of leukemia.   -Restart Anagrelide 1mg BI63mn 05/21/19. Stopped Anagrelide 09/02/19 due to fatigue and heart palpitations.   -Restarted hydrea 09/02/19 at 500mg in45mdays/Fridays and 1000mg the52mt of the week. Due to bloating, swelling and fatigue I reduced Hydrea to 500mg MWF 28m1000mg the r51mof the week on 01/30/20.     INTERVAL HISTORY:  Danielle Oconnell a follow up of ET. She was last seen by me 5 months ago. She presents to the clinic alone. She notes she has been tired most of the time and gets SOB after shorter distances. She notes bloating and fluid in her upper abdomen/chest. She notes LE edema as well. She is taking Hydrea 500mg on MF 52m1000mg the res56m the week.    REVIEW OF SYSTEMS:   Constitutional: Denies fevers, chills or abnormal weight loss (+) fatigue Eyes: Denies blurriness of vision Ears, nose, mouth, throat, and face: Denies mucositis or sore throat Respiratory: Denies cough or wheezes (+) SOB upon mild exertion  Cardiovascular: Denies palpitation, chest discomfort (+)  lower extremity swelling Gastrointestinal:  Denies nausea, heartburn or change in bowel habits (+) Abdominal bloating  Skin: Denies abnormal skin  rashes Lymphatics: Denies new lymphadenopathy or easy bruising Neurological:Denies numbness, tingling or new weaknesses Behavioral/Psych: Mood is stable, no new changes  All other systems were reviewed with the patient and are negative.  MEDICAL HISTORY:  Past Medical History:  Diagnosis Date  . Arthritis    knees  . Goiter    not a problem  . History of hiatal hernia   . Hx of seasonal allergies   . Irritable bowel syndrome     SURGICAL HISTORY: Past Surgical History:  Procedure Laterality Date  . ABDOMINAL HYSTERECTOMY    . COLONOSCOPY W/ POLYPECTOMY    . COLONOSCOPY WITH PROPOFOL N/A 11/08/2015   Procedure: COLONOSCOPY WITH PROPOFOL;  Surgeon: Martin K JohnGarlan Fairon: [facility];  Service: Endoscopy;  Laterality: N/A;  . LAPAROSCOPIC PARTIAL COLECTOMY N/A 02/12/2015   Procedure: DIAGNOSTIC LAPAROSCOPY WITH LYSIS OF ADHESIONS;  Surgeon: Steven Gross,Michael Bostonon: [facility];  Service: General;  Laterality: N/A;-"small bowel obstruction"  . LEFT OOPHORECTOMY      I have reviewed the social history and family history with the patient and they are unchanged from previous note.  ALLERGIES:  is allergic to sulfa antibiotics.  MEDICATIONS:  Current Outpatient Medications  Medication Sig Dispense Refill  . Ascorbic Acid (VITAMIN C) 1000 MG tablet Take 1,000 mg by mouth daily.    . aspirin EC Marland Kitchen1 MG tablet Take 81 mg by mouth daily.    . B Complex Vitamins (B COMPLEX 1 PO) B Complex    . COLLAGEN PO Take by mouth.    . Cranberry  500 MG CAPS Take 500 mg by mouth daily.    Marland Kitchen GINKGO BILOBA PO Take 120 mg by mouth daily.    . Ginseng 100 MG CAPS Take 100 mg by mouth daily.    . hydroxyurea (HYDREA) 500 MG capsule TAKE 1 CAPSULE BY MOUTH ON MONDAY AND FRIDAY AND 2 CAPSULE BY MOUTH ON ALL OTHER DAYS 50 capsule 2  . Magnesium Hydroxide (MILK OF MAGNESIA PO) Take by mouth daily as needed.    . Omega-3 Fatty Acids (FISH OIL) 1200 MG CAPS Take 1 capsule by mouth daily.    .  Probiotic Product (ALIGN PO) Take by mouth daily.    . Evening Primrose Oil 500 MG CAPS Take 1 capsule by mouth daily.     No current facility-administered medications for this visit.    PHYSICAL EXAMINATION: ECOG PERFORMANCE STATUS: 2 - Symptomatic, <50% confined to bed  Vitals:   01/30/20 0959  BP: 131/62  Pulse: 78  Resp: 18  Temp: 99.1 F (37.3 C)  SpO2: 99%   Filed Weights   01/30/20 0959  Weight: 198 lb 14.4 oz (90.2 kg)    GENERAL:alert, no distress and comfortable SKIN: skin color, texture, turgor are normal, no rashes or significant lesions EYES: normal, Conjunctiva are pink and non-injected, sclera clear  NECK: supple, thyroid normal size, non-tender, without nodularity LYMPH:  no palpable lymphadenopathy in the cervical, axillary  LUNGS: clear to auscultation and percussion with normal breathing effort HEART: regular rate & rhythm and no murmurs and no lower extremity edema ABDOMEN:abdomen soft, non-tender and normal bowel sounds (+) very mild splenomegaly  Musculoskeletal:no cyanosis of digits and no clubbing  NEURO: alert & oriented x 3 with fluent speech, no focal motor/sensory deficits  LABORATORY DATA:  I have reviewed the data as listed CBC Latest Ref Rng & Units 01/30/2020 11/03/2019 09/01/2019  WBC 4.0 - 10.5 K/uL 4.8 5.9 7.0  Hemoglobin 12.0 - 15.0 g/dL 12.9 13.6 12.4  Hematocrit 36.0 - 46.0 % 39.5 41.8 38.1  Platelets 150 - 400 K/uL 400 466(H) 501(H)     CMP Latest Ref Rng & Units 11/03/2019 09/01/2019 05/01/2019  Glucose 70 - 99 mg/dL 106(H) 96 96  BUN 8 - 23 mg/dL 14 14 11   Creatinine 0.44 - 1.00 mg/dL 1.03(H) 0.95 0.84  Sodium 135 - 145 mmol/L 144 144 143  Potassium 3.5 - 5.1 mmol/L 4.0 3.9 4.2  Chloride 98 - 111 mmol/L 106 107 108  CO2 22 - 32 mmol/L 27 28 26   Calcium 8.9 - 10.3 mg/dL 9.9 9.5 9.5  Total Protein 6.5 - 8.1 g/dL 7.4 6.8 6.8  Total Bilirubin 0.3 - 1.2 mg/dL 0.3 0.4 0.4  Alkaline Phos 38 - 126 U/L 71 73 65  AST 15 - 41 U/L 19  20 21   ALT 0 - 44 U/L 15 14 17       RADIOGRAPHIC STUDIES: I have personally reviewed the radiological images as listed and agreed with the findings in the report. No results found.   ASSESSMENT & PLAN:  Danielle Oconnell is a 85 y.o. female with    1. Essential Thrombocythemia, JAK2(+) -Her 07/23/18 Bone Marrow biopsy showed hypercellular bone marrow, with megakaryocyte proliferation and her genetic mutation testing results showed JAK2(+), this is consistent with essential thrombocythemia. -She has tried both Anagrelide and hydrea. Given her concerns of fatigue and having heart palpitations while on Anagrelide, I switched her back to Hydrea 540m on Mondays and Fridays and 10067mthe rest of the week starting  this week on 09/02/19. Goal plt<450-500K.  -She notes to having abdominal bloating, increased swelling and increased fatigue lately. Physical exam shows stable and very mild splenomegaly. I recommend US abdomen to evaluate for Ascites. I discussed if no ascites her symptoms are likely related to Hydrea.  -Labs reviewed, plt improved to 400K. Overall hydrea is controlling her disease. Given her symptoms will decrease hydrea to MWF 564m and 10044mthe rest of the week. She is agreeable.  -f/u in 2 months    2. Fatigue  -I discussed this could be multifactorial due to Hydrea, less physical activity since COVID and her age.  -I have mildly decreased her hydrea. I also recommend she increase her physical activity at home and at gym once she completes her COVID vaccinations.   3. Family history of cancer  -Closest living relative with cancer is her daughter, who was diagnosed with breast cancer at age 83-I previously recommended she discuss this with her daughter and suggests she get genetic testing.  4. Health maintenance, disease prevention  -she continues f/u with PCP, GI -Patient has been gaining weight, she [facility] her to reduce carbohydrate in diet and  continue with more fruits andvegetables. I alsoencouragedher to maintain being physically active.   PLAN -Due to symptoms will reduce Hydrea to 50066mn MWF and 1000m62mily the rest of the week.  -Lab and F/u in 2 months and abdomen US aK[place]ew days before    No problem-specific Assessment & Plan notes found for this encounter.   Orders Placed This Encounter  Procedures  . US [place] Complete    Standing Status:   Future    Standing Expiration Date:   01/29/2021    Order Specific Question:   Reason for Exam (SYMPTOM  OR DIAGNOSIS REQUIRED)    Answer:   abdominal bloating, evaluate liver, spleen and rule out ascites    Order Specific Question:   [facility]erred imaging [facility]?    [facility]. The patient knows to call the clinic with any problems, questions or concerns. No barriers to learning was detected. The total time spent in the appointment was 30 minutes.     Danielle Oconnell 01/30/2020   I, AmoyJoslyn Devon acting as scribe for Kyree Fedorko Truitt Oconnell.   I have reviewed the above documentation for accuracy and completeness, and I agree with the above.

## 2020-01-24 ENCOUNTER — Ambulatory Visit: Payer: Medicare Other | Attending: Internal Medicine

## 2020-01-24 DIAGNOSIS — Z23 Encounter for immunization: Secondary | ICD-10-CM | POA: Insufficient documentation

## 2020-01-24 NOTE — Progress Notes (Signed)
   Covid-19 Vaccination Clinic  Name:  Danielle Oconnell    MRN: [id number] DOB: [birth year]  01/24/2020  Danielle Oconnell was observed post Covid-19 immunization for 15 minutes without incident. She was provided with Vaccine Information Sheet and instruction to access the V-Safe system.   Danielle Oconnell was instructed to call 911 with any severe reactions post vaccine: Marland Kitchen Difficulty breathing  . Swelling of face and throat  . A fast heartbeat  . A bad rash all over body  . Dizziness and weakness   Immunizations Administered    Name Date Dose VIS Date Route   Pfizer COVID-19 Vaccine 01/24/2020 12:26 PM 0.3 mL 10/31/2019 Intramuscular   Manufacturer: [place]   Lot: [id number]   [place]: [id number]

## 2020-01-30 ENCOUNTER — Inpatient Hospital Stay: Payer: Medicare Other | Attending: Hematology

## 2020-01-30 ENCOUNTER — Telehealth: Payer: Self-pay | Admitting: Hematology

## 2020-01-30 ENCOUNTER — Other Ambulatory Visit: Payer: Self-pay

## 2020-01-30 ENCOUNTER — Inpatient Hospital Stay: Payer: Medicare Other | Admitting: Hematology

## 2020-01-30 ENCOUNTER — Encounter: Payer: Self-pay | Admitting: Hematology

## 2020-01-30 VITALS — BP 131/62 | HR 78 | Temp 99.1°F | Resp 18 | Ht 61.0 in | Wt 198.9 lb

## 2020-01-30 DIAGNOSIS — R5383 Other fatigue: Secondary | ICD-10-CM | POA: Diagnosis not present

## 2020-01-30 DIAGNOSIS — R0602 Shortness of breath: Secondary | ICD-10-CM | POA: Insufficient documentation

## 2020-01-30 DIAGNOSIS — D473 Essential (hemorrhagic) thrombocythemia: Secondary | ICD-10-CM | POA: Diagnosis present

## 2020-01-30 DIAGNOSIS — M1990 Unspecified osteoarthritis, unspecified site: Secondary | ICD-10-CM | POA: Insufficient documentation

## 2020-01-30 DIAGNOSIS — R600 Localized edema: Secondary | ICD-10-CM | POA: Diagnosis not present

## 2020-01-30 DIAGNOSIS — Z803 Family history of malignant neoplasm of breast: Secondary | ICD-10-CM | POA: Diagnosis not present

## 2020-01-30 DIAGNOSIS — Z7982 Long term (current) use of aspirin: Secondary | ICD-10-CM | POA: Insufficient documentation

## 2020-01-30 DIAGNOSIS — Z79899 Other long term (current) drug therapy: Secondary | ICD-10-CM | POA: Insufficient documentation

## 2020-01-30 LAB — CBC WITH DIFFERENTIAL (CANCER CENTER ONLY)
Abs Immature Granulocytes: 0.01 10*3/uL (ref 0.00–0.07)
Basophils Absolute: 0.1 10*3/uL (ref 0.0–0.1)
Basophils Relative: 1 %
Eosinophils Absolute: 0.1 10*3/uL (ref 0.0–0.5)
Eosinophils Relative: 2 %
HCT: 39.5 % (ref 36.0–46.0)
Hemoglobin: 12.9 g/dL (ref 12.0–15.0)
Immature Granulocytes: 0 %
Lymphocytes Relative: 39 %
Lymphs Abs: 1.9 10*3/uL (ref 0.7–4.0)
MCH: 34.1 pg — ABNORMAL HIGH (ref 26.0–34.0)
MCHC: 32.7 g/dL (ref 30.0–36.0)
MCV: 104.5 fL — ABNORMAL HIGH (ref 80.0–100.0)
Monocytes Absolute: 0.4 10*3/uL (ref 0.1–1.0)
Monocytes Relative: 8 %
Neutro Abs: 2.4 10*3/uL (ref 1.7–7.7)
Neutrophils Relative %: 50 %
Platelet Count: 400 10*3/uL (ref 150–400)
RBC: 3.78 MIL/uL — ABNORMAL LOW (ref 3.87–5.11)
RDW: 14.2 % (ref 11.5–15.5)
WBC Count: 4.8 10*3/uL (ref 4.0–10.5)
nRBC: 0 % (ref 0.0–0.2)

## 2020-01-30 NOTE — Telephone Encounter (Signed)
Scheduled per 03/12 los, patient received printed calender.

## 2020-02-14 ENCOUNTER — Ambulatory Visit: Payer: Medicare Other | Attending: Internal Medicine

## 2020-02-14 DIAGNOSIS — Z23 Encounter for immunization: Secondary | ICD-10-CM

## 2020-02-14 NOTE — Progress Notes (Signed)
   Covid-19 Vaccination Clinic  Name:  Danielle Oconnell    MRN: [id number] DOB: September 13, 1935  02/14/2020  Danielle Oconnell was observed post Covid-19 immunization for 15 minutes without incident. She was provided with Vaccine Information Sheet and instruction to access the V-Safe system.   Danielle Oconnell was instructed to call 911 with any severe reactions post vaccine: Marland Kitchen Difficulty breathing  . Swelling of face and throat  . A fast heartbeat  . A bad rash all over body  . Dizziness and weakness   Immunizations Administered    Name Date Dose VIS Date Route   Pfizer COVID-19 Vaccine 02/14/2020  1:39 PM 0.3 mL 10/31/2019 Intramuscular   Manufacturer: [place]   Lot: [id number]   [place]: [id number]

## 2020-03-05 ENCOUNTER — Telehealth: Payer: Self-pay

## 2020-03-05 NOTE — Telephone Encounter (Signed)
Pt left vm stating that ultrasound had not yet been scheduled.  I scheduled u/s for 03/31/2020 at 0930. NPO after mn.  I spoke with patient and gave her this information. She verbalized understanding.

## 2020-03-09 ENCOUNTER — Other Ambulatory Visit: Payer: Self-pay | Admitting: Hematology

## 2020-03-31 ENCOUNTER — Ambulatory Visit (HOSPITAL_COMMUNITY)
Admission: RE | Admit: 2020-03-31 | Discharge: 2020-03-31 | Disposition: A | Discharge status: Home / Self Care | Payer: Medicare Other | Source: Ambulatory Visit | Attending: Hematology | Admitting: Hematology

## 2020-03-31 ENCOUNTER — Other Ambulatory Visit: Payer: Self-pay

## 2020-03-31 DIAGNOSIS — D473 Essential (hemorrhagic) thrombocythemia: Secondary | ICD-10-CM | POA: Diagnosis not present

## 2020-04-01 NOTE — Progress Notes (Signed)
Danielle Oconnell   [phone] Fax:(336) [phone]   Clinic Follow up Note   Patient Care Team: Lavone Orn, MD as PCP - General (Internal Medicine)  Date of Service:  04/02/2020  CHIEF COMPLAINT: F/u for essential Thrombocytosis, JAK2+  CURRENT THERAPY:  Hydrea 529m once daily starting 07/29/18, willincrease to5082mon Mondays and Fridays and100024mhe rest of the week on 08/24/2018.She responded very well.             -Due topt'sconcerns of side effectsfrom hydrea, Switchhydreato Anagrelideon 11/2018.she started on 0.5mg22mice daily in 11/2018, increased to 1mg 61mand 0.5mg p67mn 01/28/2019. Held since 02/2019 due to back order               -Started Hydrea in 02/2019 and stopped in 6/2020due to herconcerns about risk of leukemia.              -Restart Anagrelide 1mg BI65mn 05/21/19. Stopped Anagrelide 09/02/19 due to fatigue and heart palpitations.              -Restarted hydrea 09/02/19 at 500mg in56mdays/Fridays and 1000mg the15mt of the week.Due to bloating, swelling and fatigue I reduced Hydrea to 500mg MWF 85m1000mg the r52mof the week on 01/30/20. Due to persistent symptoms will reduce Hydrea to 500mg on MWF58md 1000mg daily t68mest of the week starting 04/02/20.    INTERVAL HISTORY:  Danielle H LualDAKIA SCHIFANOa follow up of ET. She presents to the clinic alone. She notes she feels stable. She notes her energy level fluctuates. She notes with activities she need rest but able to get it done. She is on Hydrea MWF 500mg and 100073mhe rest 35mhe week. She noes her weight is increasing and so is her BP. She feels her weight gain is related to her Hydrea. She notes she has appetite but has been trying to eat less to lose weight. She notes eating mostly vegetables and fish and not much carbs. She notes upper abdominal bloating. She denies pain or breathing issues.     REVIEW OF SYSTEMS:   Constitutional: Denies fevers, chills (+) weight gain (+)  fluctuating fatigue  Eyes: Denies blurriness of vision Ears, nose, mouth, throat, and face: Denies mucositis or sore throat Respiratory: Denies cough, dyspnea or wheezes Cardiovascular: Denies palpitation, chest discomfort or lower extremity swelling Gastrointestinal:  Denies nausea, heartburn or change in bowel habits (+) Abdominal bloating  Skin: Denies abnormal skin rashes Lymphatics: Denies new lymphadenopathy or easy bruising Neurological:Denies numbness, tingling or new weaknesses Behavioral/Psych: Mood is stable, no new changes  All other systems were reviewed with the patient and are negative.  MEDICAL HISTORY:  Past Medical History:  Diagnosis Date  . Arthritis    knees  . Goiter    not a problem  . History of hiatal hernia   . Hx of seasonal allergies   . Irritable bowel syndrome     SURGICAL HISTORY: Past Surgical History:  Procedure Laterality Date  . ABDOMINAL HYSTERECTOMY    . COLONOSCOPY W/ POLYPECTOMY    . COLONOSCOPY WITH PROPOFOL N/A 11/08/2015   Procedure: COLONOSCOPY WITH PROPOFOL;  Surgeon: Martin K JohnsoGarlan Fair: [facility];  Service: Endoscopy;  Laterality: N/A;  . LAPAROSCOPIC PARTIAL COLECTOMY N/A 02/12/2015   Procedure: DIAGNOSTIC LAPAROSCOPY WITH LYSIS OF ADHESIONS;  Surgeon: Steven Gross, MMichael Boston: [facility];  Service: General;  Laterality: N/A;-"small bowel obstruction"  . LEFT OOPHORECTOMY  I have reviewed the social history and family history with the patient and they are unchanged from previous note.  ALLERGIES:  is allergic to sulfa antibiotics.  MEDICATIONS:  Current Outpatient Medications  Medication Sig Dispense Refill  . Ascorbic Acid (VITAMIN C) 1000 MG tablet Take 1,000 mg by mouth daily.    Marland Kitchen aspirin EC 81 MG tablet Take 81 mg by mouth daily.    . COLLAGEN PO Take 3 tablets by mouth daily.     . Evening Primrose Oil 500 MG CAPS Take 1 capsule by mouth daily.    Marland Kitchen GINKGO BILOBA PO Take 120 mg by mouth daily.    .  Ginseng 100 MG CAPS Take 100 mg by mouth daily.    . hydroxyurea (HYDREA) 500 MG capsule TAKE 1 CAPSULE BY MOUTH ON Monday, Wednesday, Friday and Saturday THEN TAKE 2 CAPSULES ON ALL OTHER DAYS 50 capsule 2  . Magnesium Hydroxide (MILK OF MAGNESIA PO) Take by mouth daily as needed.    . Omega-3 Fatty Acids (FISH OIL) 1200 MG CAPS Take 1 capsule by mouth daily.     No current facility-administered medications for this visit.    PHYSICAL EXAMINATION: ECOG PERFORMANCE STATUS: 1 - Symptomatic but completely ambulatory  Vitals:   04/02/20 0950 04/02/20 0951  BP: (!) 157/75 (!) 149/73  Pulse: 65   Resp: 17   Temp: 98.2 F (36.8 C)   SpO2: 99%    Filed Weights   04/02/20 0950  Weight: 204 lb 6.4 oz (92.7 kg)    Due to COVID19 we will limit examination to appearance. Patient had no complaints.  GENERAL:alert, no distress and comfortable SKIN: skin color normal, no rashes or significant lesions EYES: normal, Conjunctiva are pink and non-injected, sclera clear  NEURO: alert & oriented x 3 with fluent speech   LABORATORY DATA:  I have reviewed the data as listed CBC Latest Ref Rng & Units 04/02/2020 01/30/2020 11/03/2019  WBC 4.0 - 10.5 K/uL 4.6 4.8 5.9  Hemoglobin 12.0 - 15.0 g/dL 13.0 12.9 13.6  Hematocrit 36.0 - 46.0 % 40.2 39.5 41.8  Platelets 150 - 400 K/uL 395 400 466(H)     CMP Latest Ref Rng & Units 04/02/2020 11/03/2019 09/01/2019  Glucose 70 - 99 mg/dL 94 106(H) 96  BUN 8 - 23 mg/dL _0 Creatinine 0.44 - 1.00 mg/dL 0.97 1.03(H) 0.95  Sodium 135 - 145 mmol/L 142 144 144  Potassium 3.5 - 5.1 mmol/L 4.0 4.0 3.9  Chloride 98 - 111 mmol/L 107 106 107  CO2 22 - 32 mmol/L _1 Calcium 8.9 - 10.3 mg/dL 9.5 9.9 9.5  Total Protein 6.5 - 8.1 g/dL 6.7 7.4 6.8  Total Bilirubin 0.3 - 1.2 mg/dL 0.5 0.3 0.4  Alkaline Phos 38 - 126 U/L 61 71 73  AST 15 - 41 U/L _2 ALT 0 - 44 U/L _3 US Abdomen 03/31/20 IMPRESSION: No abnormality seen to explain the  clinical presentation. No evidence of organomegaly or ascites.    RADIOGRAPHIC STUDIES: I have personally reviewed the radiological images as listed and agreed with the findings in the report. No results found.   ASSESSMENT & PLAN:  Danielle Oconnell is a 85 y.o. female with    1. Essential Thrombocythemia, JAK2(+) -Her 07/23/18 Bone Marrow biopsy showed hypercellular bone marrow, with megakaryocyte proliferation and her genetic mutation testing results showed JAK2(+), this is consistent with essential thrombocythemia. -She has tried  both Anagrelide and hydrea. Given her concerns of fatigue and having heart palpitations while on Anagrelide, I switched her back to Hydrea. She is on current dose 535m MWF and 10089mthe rest of the week as of 01/30/20. Goal plt<450-500K.  -Given her weight gain and abdominal bloating she had a USKoreabdomen from 03/31/20 which shows no abnormalities to explain her presentation.  -Pt feels stable on dose reduced Hydrea. Pt feels her weight gain is related to Hydrea. I recommend she f/u with her PCP soon for further evaluation.  -Labs reviewed, CBC and CMP WNL except MCV 105.8. Labs stable on decreased dose Hydrea.  -I discussed as long as her counts remain in goal range, it is fine to reduce her Hydrea further due to her abdominal bloating and weight gain. Will decrease her dose to 50028mn MWFS and 1000m63me rest of the week.  -Will repeat labs in 6 weeks. F/u in 3 months    2. Fatigue, weight gain, abdominal bloating -Her fatigue now fluctuates but manageable with rest as needed.  -She has gained 16 pounds since 08/2019 and notes upper abdominal bloating as well even with healthy diet.  -Her 03/31/20 US aKoreaomen was unremarkable. I reviewed with pt  -Her symptoms are stable even with mild reduction in Hydrea dose. Will further decrease again as long as blood counts are within range.  -I recommend she f/u with her PCP and likely her GI for more work up.  3.  Family history of cancer  -Closest living relative with cancer is her daughter, who was diagnosed with breast cancer at age 40. 77 previously recommended she discuss this with her daughter and suggests she get genetic testing.  4. Health maintenance, disease prevention  -she continues f/u with PCP, GI -Patient has been gaining weight, [facility] reduce carbohydrate in diet and continue with more fruits andvegetables. I alsoencouragedher to maintain being physically active.   PLAN -Due to persistent symptoms will reduce Hydrea to 500mg73mMWFS and 1000mg 83my the rest of the week.  -Lab in 6 weeks  -Lab and f/u in 3 months     No problem-specific Assessment & Plan notes found for this encounter.   No orders of the defined types were placed in this encounter.  All questions were answered. The patient knows to call the clinic with any problems, questions or concerns. No barriers to learning was detected. The total time spent in the appointment was 20 minutes.      FeTruitt Merle/14/2021   I, Amoya Joslyn Devoncting as scribe for  FeTruitt Merle  I have reviewed the above documentation for accuracy and completeness, and I agree with the above.

## 2020-04-02 ENCOUNTER — Other Ambulatory Visit: Payer: Self-pay

## 2020-04-02 ENCOUNTER — Inpatient Hospital Stay: Payer: Medicare Other | Attending: Hematology

## 2020-04-02 ENCOUNTER — Encounter: Payer: Self-pay | Admitting: Hematology

## 2020-04-02 ENCOUNTER — Inpatient Hospital Stay (HOSPITAL_BASED_OUTPATIENT_CLINIC_OR_DEPARTMENT_OTHER): Payer: Medicare Other | Admitting: Hematology

## 2020-04-02 VITALS — BP 149/73 | HR 65 | Temp 98.2°F | Resp 17 | Ht 61.0 in | Wt 204.4 lb

## 2020-04-02 DIAGNOSIS — R5383 Other fatigue: Secondary | ICD-10-CM | POA: Insufficient documentation

## 2020-04-02 DIAGNOSIS — Z7982 Long term (current) use of aspirin: Secondary | ICD-10-CM | POA: Diagnosis not present

## 2020-04-02 DIAGNOSIS — Z803 Family history of malignant neoplasm of breast: Secondary | ICD-10-CM | POA: Diagnosis not present

## 2020-04-02 DIAGNOSIS — D473 Essential (hemorrhagic) thrombocythemia: Secondary | ICD-10-CM | POA: Insufficient documentation

## 2020-04-02 DIAGNOSIS — R140 Abdominal distension (gaseous): Secondary | ICD-10-CM | POA: Diagnosis not present

## 2020-04-02 DIAGNOSIS — M1990 Unspecified osteoarthritis, unspecified site: Secondary | ICD-10-CM | POA: Diagnosis not present

## 2020-04-02 DIAGNOSIS — Z79899 Other long term (current) drug therapy: Secondary | ICD-10-CM | POA: Insufficient documentation

## 2020-04-02 LAB — CMP (CANCER CENTER ONLY)
ALT: 13 U/L (ref 0–44)
AST: 18 U/L (ref 15–41)
Albumin: 3.7 g/dL (ref 3.5–5.0)
Alkaline Phosphatase: 61 U/L (ref 38–126)
Anion gap: 7 (ref 5–15)
BUN: 19 mg/dL (ref 8–23)
CO2: 28 mmol/L (ref 22–32)
Calcium: 9.5 mg/dL (ref 8.9–10.3)
Chloride: 107 mmol/L (ref 98–111)
Creatinine: 0.97 mg/dL (ref 0.44–1.00)
GFR, Est AFR Am: >60 mL/min (ref >60)
GFR, Estimated: 54 mL/min — ABNORMAL LOW (ref >60)
Glucose, Bld: 94 mg/dL (ref 70–99)
Potassium: 4 mmol/L (ref 3.5–5.1)
Sodium: 142 mmol/L (ref 135–145)
Total Bilirubin: 0.5 mg/dL (ref 0.3–1.2)
Total Protein: 6.7 g/dL (ref 6.5–8.1)

## 2020-04-02 LAB — CBC WITH DIFFERENTIAL (CANCER CENTER ONLY)
Abs Immature Granulocytes: 0.01 10*3/uL (ref 0.00–0.07)
Basophils Absolute: 0 10*3/uL (ref 0.0–0.1)
Basophils Relative: 1 %
Eosinophils Absolute: 0.1 10*3/uL (ref 0.0–0.5)
Eosinophils Relative: 2 %
HCT: 40.2 % (ref 36.0–46.0)
Hemoglobin: 13 g/dL (ref 12.0–15.0)
Immature Granulocytes: 0 %
Lymphocytes Relative: 34 %
Lymphs Abs: 1.5 10*3/uL (ref 0.7–4.0)
MCH: 34.2 pg — ABNORMAL HIGH (ref 26.0–34.0)
MCHC: 32.3 g/dL (ref 30.0–36.0)
MCV: 105.8 fL — ABNORMAL HIGH (ref 80.0–100.0)
Monocytes Absolute: 0.4 10*3/uL (ref 0.1–1.0)
Monocytes Relative: 10 %
Neutro Abs: 2.5 10*3/uL (ref 1.7–7.7)
Neutrophils Relative %: 53 %
Platelet Count: 395 10*3/uL (ref 150–400)
RBC: 3.8 MIL/uL — ABNORMAL LOW (ref 3.87–5.11)
RDW: 13.2 % (ref 11.5–15.5)
WBC Count: 4.6 10*3/uL (ref 4.0–10.5)
nRBC: 0 % (ref 0.0–0.2)

## 2020-04-02 MED ORDER — HYDROXYUREA 500 MG PO CAPS: ORAL_CAPSULE | ORAL | 2 refills | Status: DC

## 2020-04-05 ENCOUNTER — Telehealth: Payer: Self-pay | Admitting: Hematology

## 2020-04-05 NOTE — Telephone Encounter (Signed)
Scheduled appt per 5/14 los.  Spoke with pt and she is aware of the appt date and time.

## 2020-04-06 ENCOUNTER — Telehealth: Payer: Self-pay | Admitting: Emergency Medicine

## 2020-04-06 NOTE — Telephone Encounter (Addendum)
PT verbalized understanding of this.   ----- Message from Truitt Merle, MD sent at 04/06/2020 11:28 AM EDT ----- Please let pt know her [place] results, no concerns, thanks   Truitt Merle

## 2020-05-14 ENCOUNTER — Inpatient Hospital Stay: Payer: Medicare Other | Attending: Hematology

## 2020-05-14 ENCOUNTER — Other Ambulatory Visit: Payer: Self-pay

## 2020-05-14 DIAGNOSIS — D473 Essential (hemorrhagic) thrombocythemia: Secondary | ICD-10-CM | POA: Diagnosis not present

## 2020-05-14 LAB — CBC WITH DIFFERENTIAL (CANCER CENTER ONLY)
Abs Immature Granulocytes: 0.01 10*3/uL (ref 0.00–0.07)
Basophils Absolute: 0 10*3/uL (ref 0.0–0.1)
Basophils Relative: 1 %
Eosinophils Absolute: 0.1 10*3/uL (ref 0.0–0.5)
Eosinophils Relative: 1 %
HCT: 41.9 % (ref 36.0–46.0)
Hemoglobin: 13.6 g/dL (ref 12.0–15.0)
Immature Granulocytes: 0 %
Lymphocytes Relative: 34 %
Lymphs Abs: 2.1 10*3/uL (ref 0.7–4.0)
MCH: 33.8 pg (ref 26.0–34.0)
MCHC: 32.5 g/dL (ref 30.0–36.0)
MCV: 104.2 fL — ABNORMAL HIGH (ref 80.0–100.0)
Monocytes Absolute: 0.6 10*3/uL (ref 0.1–1.0)
Monocytes Relative: 10 %
Neutro Abs: 3.3 10*3/uL (ref 1.7–7.7)
Neutrophils Relative %: 54 %
Platelet Count: 477 10*3/uL — ABNORMAL HIGH (ref 150–400)
RBC: 4.02 MIL/uL (ref 3.87–5.11)
RDW: 13.3 % (ref 11.5–15.5)
WBC Count: 6.1 10*3/uL (ref 4.0–10.5)
nRBC: 0 % (ref 0.0–0.2)

## 2020-06-01 ENCOUNTER — Telehealth: Payer: Self-pay

## 2020-06-01 NOTE — Telephone Encounter (Signed)
-----   Message from Truitt Merle, MD sent at 05/29/2020 11:34 AM EDT ----- Please let pt know her last lab result, plt slihgtly high, but OK to continue current Hydrea dose, and repeat lab as scheduled next time.  Truitt Merle  05/29/2020

## 2020-06-01 NOTE — Telephone Encounter (Signed)
Called patient and made her aware of Dr. Ernestina Penna message. Patient verbalized understanding.

## 2020-07-02 NOTE — Progress Notes (Signed)
[name]   Telephone:(336) [phone] Fax:(336) [phone]   Clinic Follow up Note   Patient Care Team: Lavone Orn, MD as PCP - General (Internal Medicine)  Date of Service:  07/05/2020  CHIEF COMPLAINT: F/u for essential Thrombocytosis, JAK2+   CURRENT THERAPY:  Hydrea 596m once daily starting 07/29/18, willincrease to5081mon Mondays and Fridays and100035mhe rest of the week on 08/24/2018.She responded very well. -Due topt'sconcerns of side effectsfrom hydrea, Switchhydreato Anagrelideon 11/2018.she started on 0.5mg32mice daily in 11/2018, increased to 1mg 22mand 0.5mg p51mn 01/28/2019. Held since 02/2019 due to back order -Started Hydrea in 02/2019 and stopped in 6/2020due to herconcerns about risk of leukemia. -RestartAnagrelide1mg BI43mn 05/21/19. Stopped Anagrelide10/13/20 due to fatigue and heart palpitations. -Restarted hydrea10/13/20 at 500mg in11mdays/Fridays and 1000mg the25mt of the week.Due to bloating, swelling and fatigue I reduced Hydrea to 500mg MWF 22m1000mg the r5mof the week on 01/30/20.Due to persistent symptoms will reduce Hydrea to 500mg on MWF59md 1000mgdailythe72mt of the week starting 04/02/20.   INTERVAL HISTORY:  Danielle Oconnell follow up of ET. She presents to the clinic alone. She notes she is doing well.  She is on Hydrea MWFS 500mg only and15m rest of the week she takes it 1000 daily. She notes her PCP put her on Lasix, now on it 20mg as needed39m potassium 10mEq as needed3me notes she feels better since starting Lasix. She notes she was able to lose some weight. She denies ulcers or skin issues.    REVIEW OF SYSTEMS:   Constitutional: Denies fevers, chills or abnormal weight loss Eyes: Denies blurriness of vision Ears, nose, mouth, throat, and face: Denies mucositis or sore throat Respiratory: Denies cough, dyspnea or wheezes Cardiovascular: Denies  palpitation, chest discomfort (+)  lower extremity swelling Gastrointestinal:  Denies nausea, heartburn or change in bowel habits Skin: Denies abnormal skin rashes Lymphatics: Denies new lymphadenopathy or easy bruising Neurological:Denies numbness, tingling or new weaknesses Behavioral/Psych: Mood is stable, no new changes  All other systems were reviewed with the patient and are negative.  MEDICAL HISTORY:  Past Medical History:  Diagnosis Date  . Arthritis    knees  . Goiter    not a problem  . History of hiatal hernia   . Hx of seasonal allergies   . Irritable bowel syndrome     SURGICAL HISTORY: Past Surgical History:  Procedure Laterality Date  . ABDOMINAL HYSTERECTOMY    . COLONOSCOPY W/ POLYPECTOMY    . COLONOSCOPY WITH PROPOFOL N/A 11/08/2015   Procedure: COLONOSCOPY WITH PROPOFOL;  Surgeon: Martin K JohnsonGarlan Fair [facility];  Service: Endoscopy;  Laterality: N/A;  . LAPAROSCOPIC PARTIAL COLECTOMY N/A 02/12/2015   Procedure: DIAGNOSTIC LAPAROSCOPY WITH LYSIS OF ADHESIONS;  Surgeon: Steven Gross, MDMichael Boston [facility];  Service: General;  Laterality: N/A;-"small bowel obstruction"  . LEFT OOPHORECTOMY      I have reviewed the social history and family history with the patient and they are unchanged from previous note.  ALLERGIES:  is allergic to sulfa antibiotics.  MEDICATIONS:  Current Outpatient Medications  Medication Sig Dispense Refill  . Ascorbic Acid (VITAMIN C) 1000 MG tablet Take 1,000 mg by mouth daily.    . aspirin EC 81 Marland KitchenG tablet Take 81 mg by mouth daily.    . COLLAGEN PO Take 3 tablets by mouth daily.     . Evening Primrose Oil 500 MG CAPS Take  1 capsule by mouth daily.    Marland Kitchen GINKGO BILOBA PO Take 120 mg by mouth daily.    . Ginseng 100 MG CAPS Take 100 mg by mouth daily.    . hydroxyurea (HYDREA) 500 MG capsule TAKE 1 CAPSULE BY MOUTH ON Monday, Wednesday, Friday and Saturday THEN TAKE 2 CAPSULES ON ALL OTHER DAYS 50 capsule 4  .  Magnesium Hydroxide (MILK OF MAGNESIA PO) Take by mouth daily as needed.    . Omega-3 Fatty Acids (FISH OIL) 1200 MG CAPS Take 1 capsule by mouth daily.     No current facility-administered medications for this visit.    PHYSICAL EXAMINATION: ECOG PERFORMANCE STATUS: 1 - Symptomatic but completely ambulatory  Vitals:   07/05/20 1008 07/05/20 1009  BP: (!) 151/81 (!) 144/71  Pulse: 73   Resp: 17   Temp: (!) 97 F (36.1 C)   SpO2: 99%    Filed Weights   07/05/20 1008  Weight: 197 lb 14.4 oz (89.8 kg)    Due to COVID19 we will limit examination to appearance. Patient had no complaints.  GENERAL:alert, no distress and comfortable SKIN: skin color normal, no rashes or significant lesions EYES: normal, Conjunctiva are pink and non-injected, sclera clear  NEURO: alert & oriented x 3 with fluent speech  (+) LE pitting Edema   LABORATORY DATA:  I have reviewed the data as listed CBC Latest Ref Rng & Units 07/05/2020 05/14/2020 04/02/2020  WBC 4.0 - 10.5 K/uL 5.0 6.1 4.6  Hemoglobin 12.0 - 15.0 g/dL 13.4 13.6 13.0  Hematocrit 36 - 46 % 40.6 41.9 40.2  Platelets 150 - 400 K/uL 422(H) 477(H) 395     CMP Latest Ref Rng & Units 04/02/2020 11/03/2019 09/01/2019  Glucose 70 - 99 mg/dL 94 106(H) 96  BUN 8 - 23 mg/dL _0 Creatinine 0.44 - 1.00 mg/dL 0.97 1.03(H) 0.95  Sodium 135 - 145 mmol/L 142 144 144  Potassium 3.5 - 5.1 mmol/L 4.0 4.0 3.9  Chloride 98 - 111 mmol/L 107 106 107  CO2 22 - 32 mmol/L _1 Calcium 8.9 - 10.3 mg/dL 9.5 9.9 9.5  Total Protein 6.5 - 8.1 g/dL 6.7 7.4 6.8  Total Bilirubin 0.3 - 1.2 mg/dL 0.5 0.3 0.4  Alkaline Phos 38 - 126 U/L 61 71 73  AST 15 - 41 U/L _2 ALT 0 - 44 U/L _3 RADIOGRAPHIC STUDIES: I have personally reviewed the radiological images as listed and agreed with the findings in the report. No results found.   ASSESSMENT & PLAN:  Danielle Oconnell is a 85 y.o. female with    1. Essential Thrombocythemia,  JAK2(+) -Her 07/23/18 Bone Marrow biopsy showed hypercellular bone marrow, with megakaryocyte proliferation and her genetic mutation testing results showed JAK2(+), this is consistent with essential thrombocythemia. -She has tried both Anagrelide and hydrea. Given her concerns offatigue and having heart palpitations while on Anagrelide, I switched her back to Hydrea. She is on current dose 572m MWF and 10062mthe rest of the week as of 01/30/20.Goal plt<450-500K.  -Given her weight gain and abdominal bloating she had a USKoreabdomen from 03/31/20 which shows no abnormalities to explain her presentation.  -Pt feels stable on dose reduced Hydrea. Pt feels  -She is clinically doing well. Labs reviewed, plt 422K, MCV 102.3. She continues to tolerate Hydrea well, will continue.  -Continue Hydrea 50074mn MWFS and 1000m8me rest of the week.  -  Lab in 3 months, f/u in 6 months   2. Fatigue, weight gain, abdominal bloating, LE edema  -Her fatigue now fluctuates but manageable with rest as needed.  -She has gained 16 pounds since 08/2019 and notes upper abdominal bloating as well even with healthy diet.  -Her 03/31/20 US abdomen was unremarkable.  -Her PCP started her on Lasix she is currently on 44m Lasix as needed with 160m potassium.  -She has been able to lose 7 pounds and has more energy.  -She has not taken Lasix this week and has mild LE edema lately.   3. Family history of cancer  -Closest living relative with cancer is her daughter, who was diagnosed with breast cancer at age 83-I previously recommended she discuss this with her daughter and suggests she get genetic testing.  4. Health maintenance, disease prevention  -She continues f/u with PCP, GI   PLAN -plt 422K today.  -Continue Hydrea to 50067mn MWFS and 1000m72mlythe rest of the week. I refilled today  -Lab in 3 months  -Lab and f/u in 6 months     No problem-specific Assessment & Plan notes found for this  encounter.   No orders of the defined types were placed in this encounter.  All questions were answered. The patient knows to call the clinic with any problems, questions or concerns. No barriers to learning was detected.      Anzel Kearse Truitt Merle 07/05/2020   I, AmoyJoslyn Devon acting as scribe for [employer] Truitt Merle.   I have reviewed the above documentation for accuracy and completeness, and I agree with the above.

## 2020-07-05 ENCOUNTER — Inpatient Hospital Stay: Payer: Medicare Other | Attending: Hematology | Admitting: Hematology

## 2020-07-05 ENCOUNTER — Other Ambulatory Visit: Payer: Self-pay

## 2020-07-05 ENCOUNTER — Encounter: Payer: Self-pay | Admitting: Hematology

## 2020-07-05 ENCOUNTER — Telehealth: Payer: Self-pay | Admitting: Hematology

## 2020-07-05 ENCOUNTER — Inpatient Hospital Stay: Payer: Medicare Other

## 2020-07-05 VITALS — BP 144/71 | HR 73 | Temp 97.0°F | Resp 17 | Ht 61.0 in | Wt 197.9 lb

## 2020-07-05 DIAGNOSIS — K589 Irritable bowel syndrome without diarrhea: Secondary | ICD-10-CM | POA: Insufficient documentation

## 2020-07-05 DIAGNOSIS — Z79899 Other long term (current) drug therapy: Secondary | ICD-10-CM | POA: Insufficient documentation

## 2020-07-05 DIAGNOSIS — D473 Essential (hemorrhagic) thrombocythemia: Secondary | ICD-10-CM

## 2020-07-05 DIAGNOSIS — Z7982 Long term (current) use of aspirin: Secondary | ICD-10-CM | POA: Diagnosis not present

## 2020-07-05 LAB — CBC WITH DIFFERENTIAL (CANCER CENTER ONLY)
Abs Immature Granulocytes: 0.01 10*3/uL (ref 0.00–0.07)
Basophils Absolute: 0 10*3/uL (ref 0.0–0.1)
Basophils Relative: 1 %
Eosinophils Absolute: 0 10*3/uL (ref 0.0–0.5)
Eosinophils Relative: 1 %
HCT: 40.6 % (ref 36.0–46.0)
Hemoglobin: 13.4 g/dL (ref 12.0–15.0)
Immature Granulocytes: 0 %
Lymphocytes Relative: 34 %
Lymphs Abs: 1.7 10*3/uL (ref 0.7–4.0)
MCH: 33.8 pg (ref 26.0–34.0)
MCHC: 33 g/dL (ref 30.0–36.0)
MCV: 102.3 fL — ABNORMAL HIGH (ref 80.0–100.0)
Monocytes Absolute: 0.4 10*3/uL (ref 0.1–1.0)
Monocytes Relative: 9 %
Neutro Abs: 2.8 10*3/uL (ref 1.7–7.7)
Neutrophils Relative %: 55 %
Platelet Count: 422 10*3/uL — ABNORMAL HIGH (ref 150–400)
RBC: 3.97 MIL/uL (ref 3.87–5.11)
RDW: 13.5 % (ref 11.5–15.5)
WBC Count: 5 10*3/uL (ref 4.0–10.5)
nRBC: 0 % (ref 0.0–0.2)

## 2020-07-05 MED ORDER — HYDROXYUREA 500 MG PO CAPS: ORAL_CAPSULE | ORAL | 4 refills | Status: DC

## 2020-07-05 NOTE — Telephone Encounter (Signed)
Scheduled per los. Gave avs and calendar  

## 2020-08-03 ENCOUNTER — Telehealth: Payer: Self-pay

## 2020-08-03 NOTE — Telephone Encounter (Signed)
Per Dr. Burr Medico Danielle Danielle Oconnell can go off hydrea for 3 days.  I reviewed this with Danielle Oconnell and she verbalized understanding.

## 2020-08-03 NOTE — Telephone Encounter (Signed)
Danielle Oconnell called today.  She fell last week while out of town.  She hit her left shin.  She went to ED and they prescribed keflex.  She now has swelling, a large bruise, and blisters on her lower left leg.  Today she went to her PCP, Danielle Oconnell.  Danielle Oconnell would like her to stop taking hydrea for 3 days "so the leg has a chance to heal" if ok with Danielle Oconnell.

## 2020-09-30 ENCOUNTER — Inpatient Hospital Stay: Payer: Medicare Other | Attending: Hematology

## 2020-09-30 ENCOUNTER — Other Ambulatory Visit: Payer: Self-pay

## 2020-09-30 DIAGNOSIS — D473 Essential (hemorrhagic) thrombocythemia: Secondary | ICD-10-CM

## 2020-09-30 LAB — CBC WITH DIFFERENTIAL (CANCER CENTER ONLY)
Abs Immature Granulocytes: 0.01 10*3/uL (ref 0.00–0.07)
Basophils Absolute: 0 10*3/uL (ref 0.0–0.1)
Basophils Relative: 1 %
Eosinophils Absolute: 0 10*3/uL (ref 0.0–0.5)
Eosinophils Relative: 1 %
HCT: 39.9 % (ref 36.0–46.0)
Hemoglobin: 12.9 g/dL (ref 12.0–15.0)
Immature Granulocytes: 0 %
Lymphocytes Relative: 44 %
Lymphs Abs: 2.3 10*3/uL (ref 0.7–4.0)
MCH: 33.9 pg (ref 26.0–34.0)
MCHC: 32.3 g/dL (ref 30.0–36.0)
MCV: 104.7 fL — ABNORMAL HIGH (ref 80.0–100.0)
Monocytes Absolute: 0.5 10*3/uL (ref 0.1–1.0)
Monocytes Relative: 9 %
Neutro Abs: 2.4 10*3/uL (ref 1.7–7.7)
Neutrophils Relative %: 45 %
Platelet Count: 473 10*3/uL — ABNORMAL HIGH (ref 150–400)
RBC: 3.81 MIL/uL — ABNORMAL LOW (ref 3.87–5.11)
RDW: 13.8 % (ref 11.5–15.5)
WBC Count: 5.2 10*3/uL (ref 4.0–10.5)
nRBC: 0 % (ref 0.0–0.2)

## 2020-09-30 LAB — CMP (CANCER CENTER ONLY)
ALT: 12 U/L (ref 0–44)
AST: 20 U/L (ref 15–41)
Albumin: 3.8 g/dL (ref 3.5–5.0)
Alkaline Phosphatase: 68 U/L (ref 38–126)
Anion gap: 6 (ref 5–15)
BUN: 7 mg/dL — ABNORMAL LOW (ref 8–23)
CO2: 31 mmol/L (ref 22–32)
Calcium: 9.9 mg/dL (ref 8.9–10.3)
Chloride: 107 mmol/L (ref 98–111)
Creatinine: 0.84 mg/dL (ref 0.44–1.00)
GFR, Estimated: >60 mL/min (ref >60)
Glucose, Bld: 94 mg/dL (ref 70–99)
Potassium: 4.4 mmol/L (ref 3.5–5.1)
Sodium: 144 mmol/L (ref 135–145)
Total Bilirubin: 0.5 mg/dL (ref 0.3–1.2)
Total Protein: 7.2 g/dL (ref 6.5–8.1)

## 2020-10-03 ENCOUNTER — Telehealth: Payer: Self-pay | Admitting: Hematology

## 2020-10-03 ENCOUNTER — Encounter: Payer: Self-pay | Admitting: Hematology

## 2020-10-05 ENCOUNTER — Inpatient Hospital Stay: Payer: Medicare Other

## 2020-11-22 DIAGNOSIS — Z09 Encounter for follow-up examination after completed treatment for conditions other than malignant neoplasm: Secondary | ICD-10-CM | POA: Diagnosis not present

## 2020-11-26 DIAGNOSIS — I872 Venous insufficiency (chronic) (peripheral): Secondary | ICD-10-CM | POA: Diagnosis not present

## 2020-11-26 DIAGNOSIS — D123 Benign neoplasm of transverse colon: Secondary | ICD-10-CM | POA: Diagnosis not present

## 2020-11-26 DIAGNOSIS — K635 Polyp of colon: Secondary | ICD-10-CM | POA: Diagnosis not present

## 2020-11-26 DIAGNOSIS — D473 Essential (hemorrhagic) thrombocythemia: Secondary | ICD-10-CM | POA: Diagnosis not present

## 2020-11-26 DIAGNOSIS — D122 Benign neoplasm of ascending colon: Secondary | ICD-10-CM | POA: Diagnosis not present

## 2020-11-26 DIAGNOSIS — D120 Benign neoplasm of cecum: Secondary | ICD-10-CM | POA: Diagnosis not present

## 2020-11-26 DIAGNOSIS — L97829 Non-pressure chronic ulcer of other part of left lower leg with unspecified severity: Secondary | ICD-10-CM | POA: Diagnosis not present

## 2020-11-26 DIAGNOSIS — D124 Benign neoplasm of descending colon: Secondary | ICD-10-CM | POA: Diagnosis not present

## 2020-11-26 DIAGNOSIS — K6389 Other specified diseases of intestine: Secondary | ICD-10-CM | POA: Diagnosis not present

## 2020-11-26 DIAGNOSIS — Z9889 Other specified postprocedural states: Secondary | ICD-10-CM | POA: Diagnosis not present

## 2021-01-03 IMAGING — US US ABDOMEN COMPLETE
2 series · 13 of 25 positions shown · non-contrast
Comparison: CT 02/10/2015

CLINICAL DATA: Abdominal bloating. Question organomegaly or
ascites. Essential thrombocythemia.

EXAM:
ABDOMEN ULTRASOUND COMPLETE

[Series 1: us abdomen complete · 12 of 120 slices shown (1 of 2)]
[im 1/120]
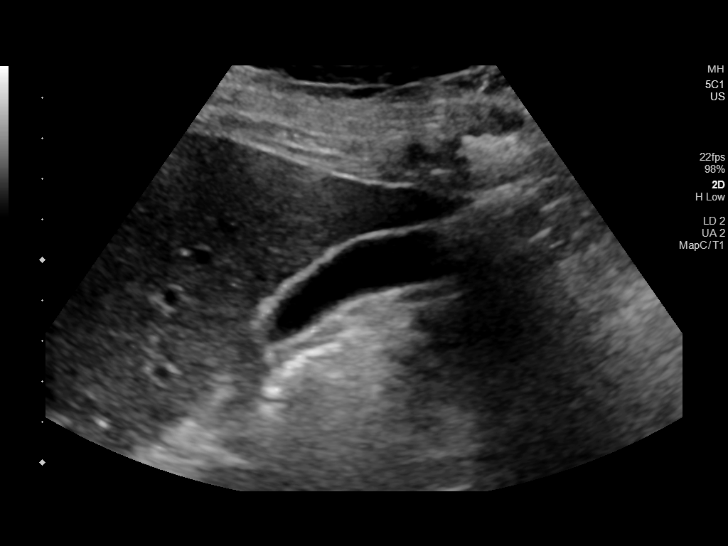
[im 11/120]
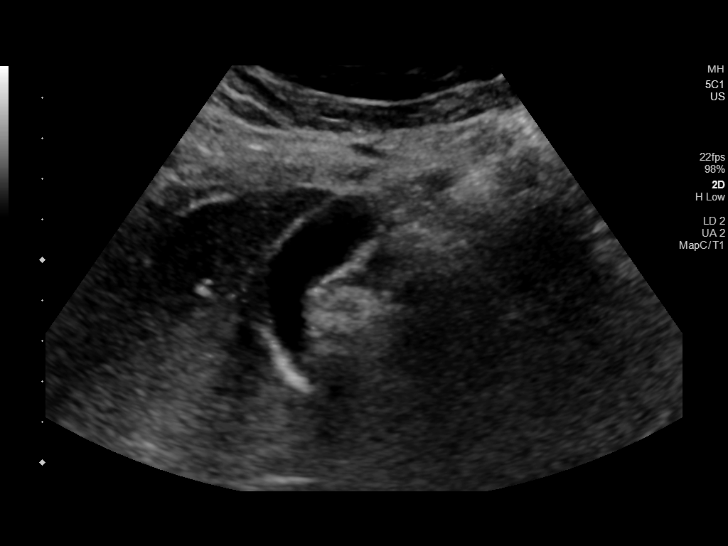
[im 21/120]
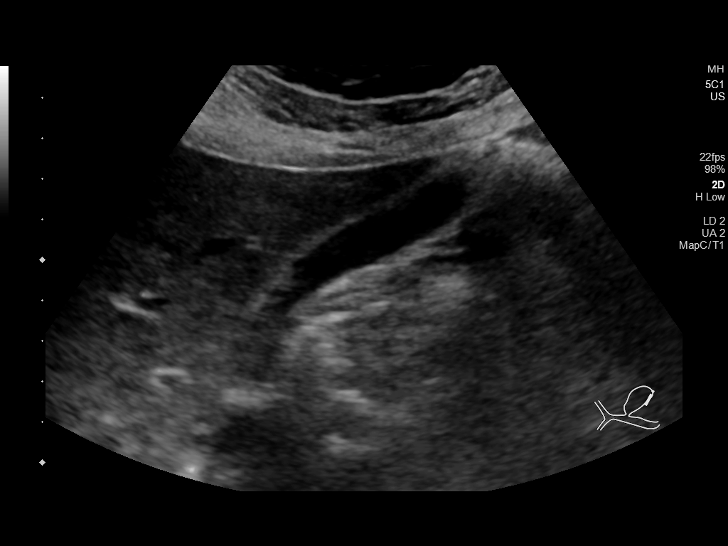
[im 32/120]
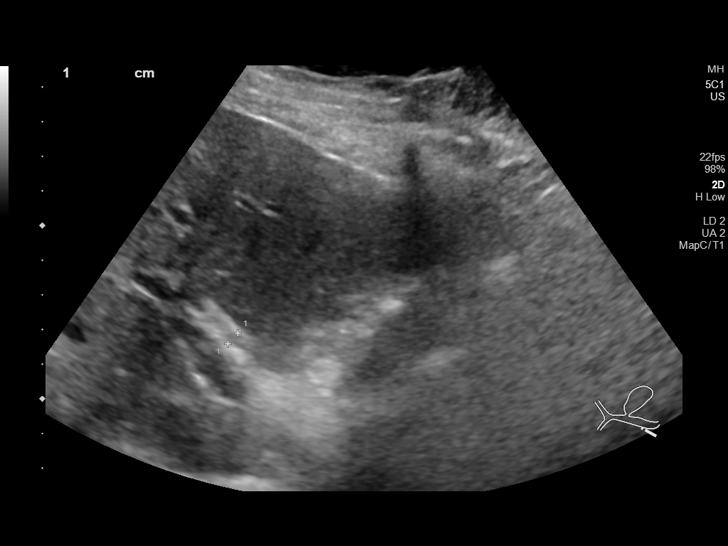
[im 42/120]
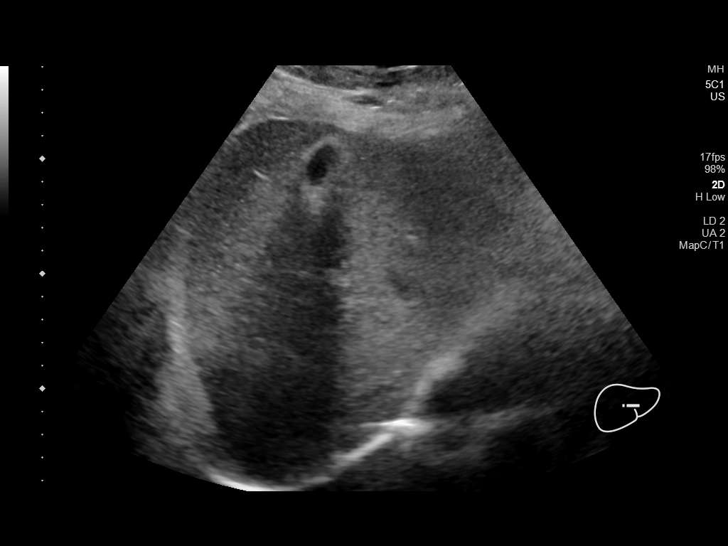
[im 52/120]
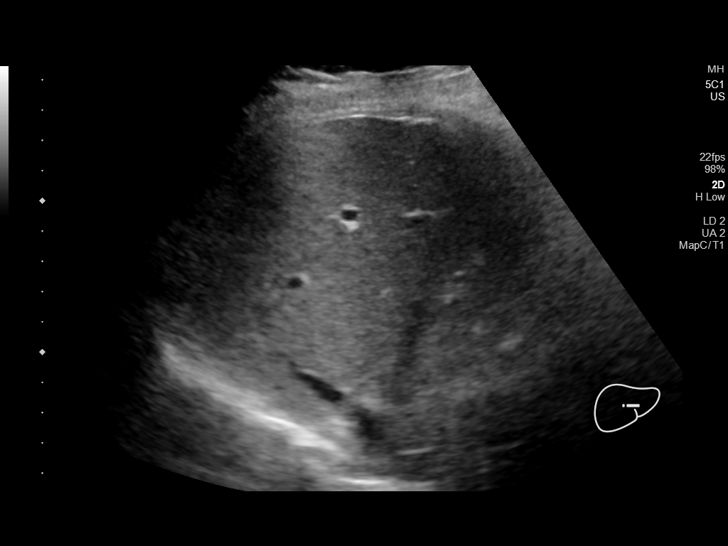
[im 63/120]
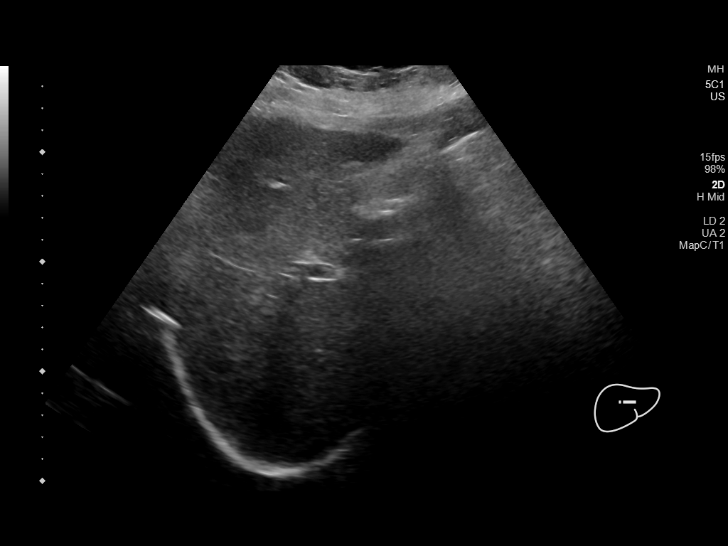
[im 73/120]
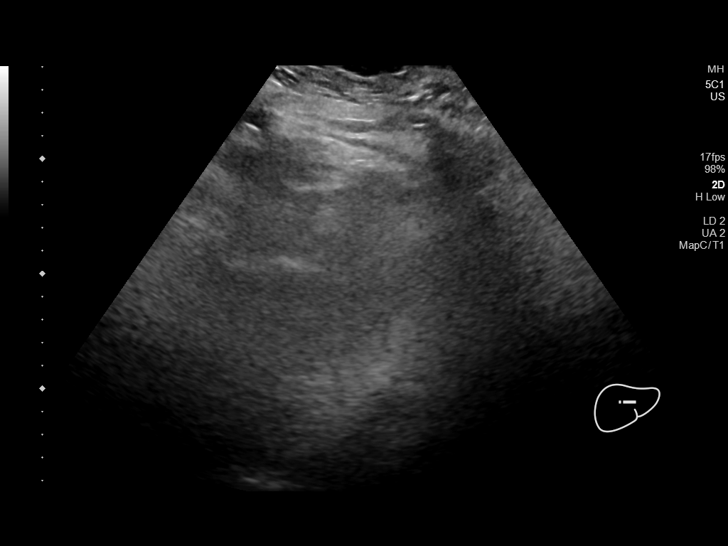
[im 83/120]
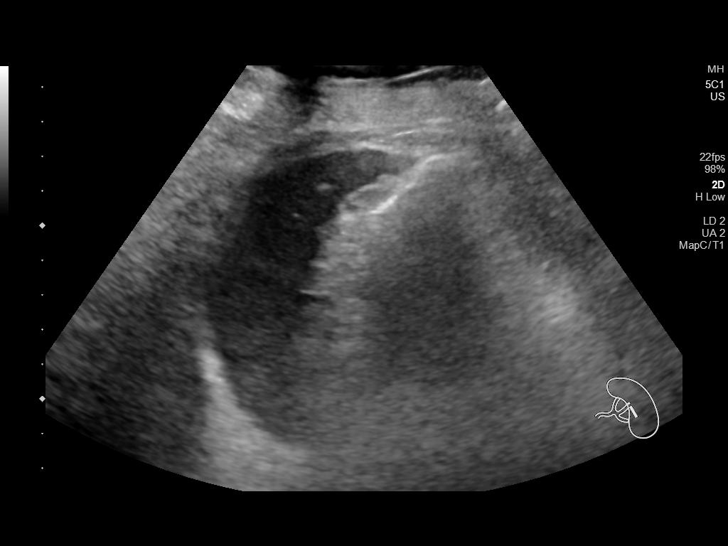
[im 94/120]
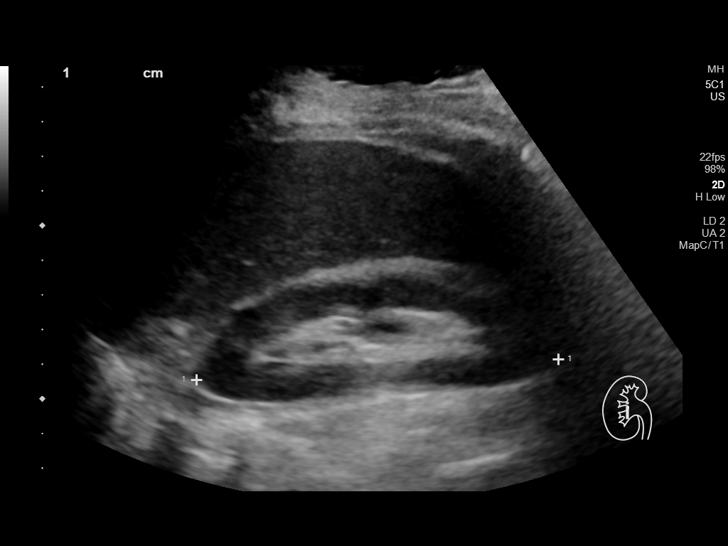
[im 104/120]
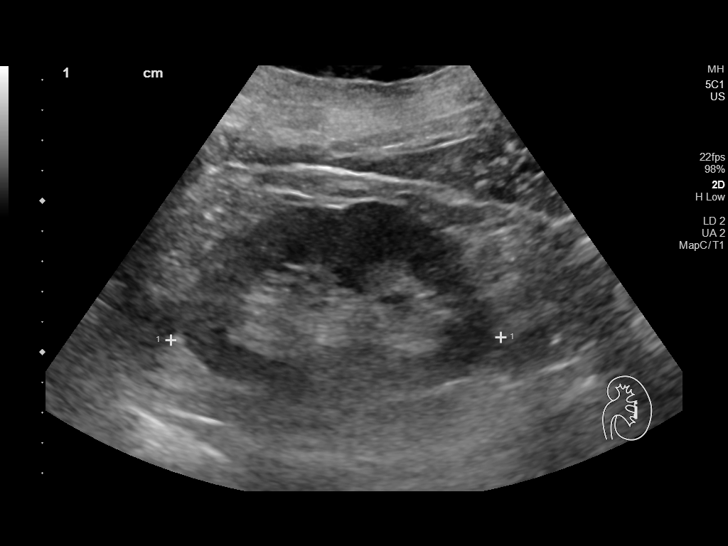
[im 114/120]
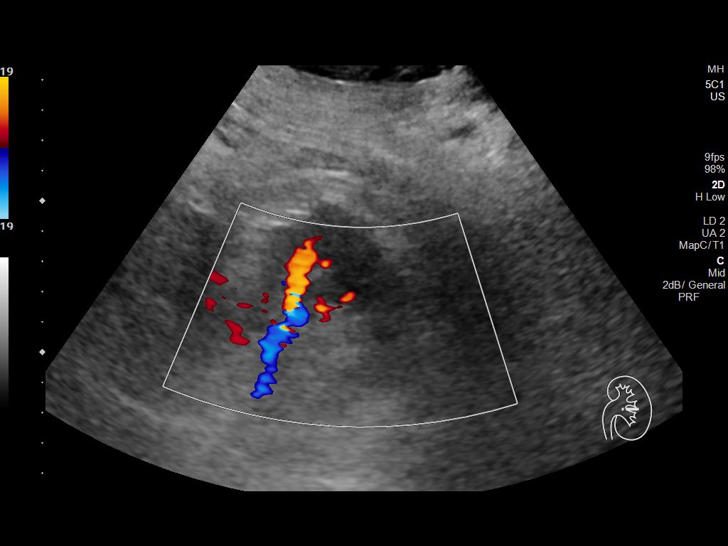

[Series 2: us abdomen complete · 1 of 1 slices shown (2 of 2)]
[im 1/1]
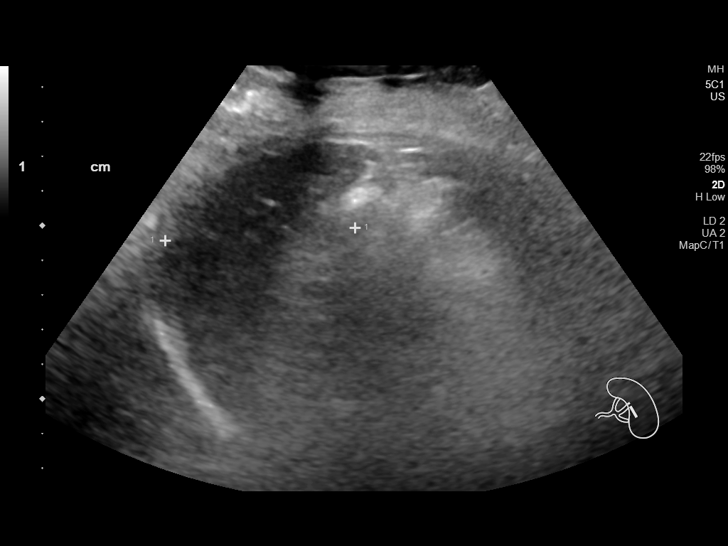

[13 of 25 positions shown; findings below may reference images not displayed]

FINDINGS: Gallbladder: No gallstones or wall thickening visualized. No
sonographic Murphy sign noted by sonographer.

Common bile duct: Diameter: 4 mm , normal

Liver: No focal lesion identified. Within normal limits in
parenchymal echogenicity. Portal vein is patent on color Doppler
imaging with normal direction of blood flow towards the liver.

IVC: No abnormality visualized.

Pancreas: Visualized portion unremarkable.

Spleen: Normal size, maximal dimension 5.5 cm. Scattered calcified
granulomas.

Right Kidney: Length: 10.5 cm. Echogenicity within normal limits. No
mass or hydronephrosis visualized.

Left Kidney: Length: 10.9 cm. Echogenicity within normal limits. No
mass or hydronephrosis visualized.

Abdominal aorta: No aneurysm visualized.

Other findings: No ascites.
IMPRESSION: No abnormality seen to explain the clinical presentation. No
evidence of organomegaly or ascites.

## 2021-01-08 ENCOUNTER — Other Ambulatory Visit: Payer: Self-pay | Admitting: Hematology

## 2021-01-10 DIAGNOSIS — E042 Nontoxic multinodular goiter: Secondary | ICD-10-CM | POA: Diagnosis not present

## 2021-01-10 DIAGNOSIS — R7989 Other specified abnormal findings of blood chemistry: Secondary | ICD-10-CM | POA: Diagnosis not present

## 2021-01-10 NOTE — Progress Notes (Signed)
[place]   Telephone:(336) [phone] Fax:(336) [phone]   Clinic Follow up Note   Patient Care Team: Lavone Orn, MD as PCP - General (Internal Medicine)  Date of Service:  01/12/2021  CHIEF COMPLAINT: F/u for essential Thrombocytosis, JAK2+   CURRENT THERAPY:  Hydrea 555m once daily starting 07/29/18, willincrease to5037mon Mondays and Fridays and100026mhe rest of the week on 08/24/2018.She responded very well. -Due topt'sconcerns of side effectsfrom hydrea, Switchhydreato Anagrelideon 11/2018.she started on 0.5mg102mice daily in 11/2018, increased to 1mg 18mand 0.5mg p25mn 01/28/2019. Held since 02/2019 due to back order -Started Hydrea in 02/2019 and stopped in 6/2020due to herconcerns about risk of leukemia. -RestartAnagrelide1mg BI30mn 05/21/19. Stopped Anagrelide10/13/20 due to fatigue and heart palpitations. -Restarted hydrea10/13/20 at 500mg in70mdays/Fridays and 1000mg the4mt of the week.Due to bloating, swelling and fatigue I reduced Hydrea to 500mg MWF 34m1000mg the r72mof the week on 01/30/20.Due topersistentsymptoms will reduce Hydrea to 500mg on MWF15m 1000mgdailythe17mt of the weekstarting 04/02/20.  INTERVAL HISTORY:  Danielle Oconnell follow up of ET. She as last seen by me 6 months ago. She presents to the clinic alone. She notes she is doing well. She notes she has cramp in b/l legs initiating form her feet. This has occurred daily for the past 2 days. She notes she had a bad fall in bathtub on 07/27/20 and had injury to her lower left leg. She required wound clinic and continues to have significant swelling there since, stable.  She [place].  [place] SYSTEMS:   Constitutional: Denies fevers, chills or abnormal weight loss Eyes: Denies  blurriness of vision Ears, nose, mouth, throat, and face: Denies mucositis or sore throat Respiratory: Denies cough, dyspnea or wheezes Cardiovascular: Denies palpitation, chest discomfort (+) Left lower extremity swelling Gastrointestinal:  Denies nausea, heartburn or change in bowel habits Skin: Denies abnormal skin rashes MSK: (+) Leg cramps  Lymphatics: Denies new lymphadenopathy or easy bruising Neurological:Denies numbness, tingling or new weaknesses Behavioral/Psych: Mood is stable, no new changes  All other systems were reviewed with the patient and are negative.  MEDICAL HISTORY:  Past Medical History:  Diagnosis Date  . Arthritis    knees  . Goiter    not a problem  . History of hiatal hernia   . Hx of seasonal allergies   . Irritable bowel syndrome     SURGICAL HISTORY: Past Surgical History:  Procedure Laterality Date  . ABDOMINAL HYSTERECTOMY    . COLONOSCOPY W/ POLYPECTOMY    . COLONOSCOPY WITH PROPOFOL N/A 11/08/2015   Procedure: COLONOSCOPY WITH PROPOFOL;  Surgeon: Martin K JohnGarlan Fairon: [facility];  Service: Endoscopy;  Laterality: N/A;  . LAPAROSCOPIC PARTIAL COLECTOMY N/A 02/12/2015   Procedure: DIAGNOSTIC LAPAROSCOPY WITH LYSIS OF ADHESIONS;  Surgeon: Steven Gross,Michael Bostonon: [facility];  Service: General;  Laterality: N/A;-"small bowel obstruction"  . LEFT OOPHORECTOMY      I have reviewed the social history and family history with the patient and they are unchanged from previous note.  ALLERGIES:  is allergic to sulfa antibiotics.  MEDICATIONS:  Current Outpatient Medications  Medication Sig Dispense Refill  . COLLAGEN-VITAMIN C-BIOTIN PO Take 6 tablets by mouth daily.    . polyethylene glycol (MIRALAX / GLYCOLAX) 17 g packet Take 17 g by mouth  daily.    . Zinc 50 MG CAPS Take 1 capsule by mouth daily.    . Ascorbic Acid (VITAMIN C) 1000 MG tablet Take 1,000 mg by mouth daily.    Marland Kitchen aspirin EC 81 MG tablet Take 81 mg by mouth daily.    .  Evening Primrose Oil 500 MG CAPS Take 1 capsule by mouth daily.    Marland Kitchen GINKGO BILOBA PO Take 120 mg by mouth daily.    . Ginseng 100 MG CAPS Take 100 mg by mouth daily.    . hydroxyurea (HYDREA) 500 MG capsule TAKE 1 CAPSULE BY MOUTH ON Monday, Wednesday, Friday and Saturday THEN TAKE 2 CAPSULES ON ALL OTHER DAYS 50 capsule 5  . Omega-3 Fatty Acids (FISH OIL) 1200 MG CAPS Take 1 capsule by mouth daily.     No current facility-administered medications for this visit.    PHYSICAL EXAMINATION: ECOG PERFORMANCE STATUS: 0 - Asymptomatic  Vitals:   01/12/21 1051  BP: 127/60  Pulse: 72  Resp: 18  Temp: (!) 96.5 F (35.8 C)  SpO2: 100%   Filed Weights   01/12/21 1051  Weight: 195 lb 9.6 oz (88.7 kg)    GENERAL:alert, no distress and comfortable SKIN: skin color, texture, turgor are normal, no rashes or significant lesions EYES: normal, Conjunctiva are pink and non-injected, sclera clear (+) Large scar of left lower leg NECK: supple, thyroid normal size, non-tender, without nodularity LYMPH:  no palpable lymphadenopathy in the cervical, axillary  LUNGS: clear to auscultation and percussion with normal breathing effort HEART: regular rate & rhythm and no murmurs (+) Left lower extremity edema ABDOMEN:abdomen soft, non-tender and normal bowel sounds Musculoskeletal:no cyanosis of digits and no clubbing  NEURO: alert & oriented x 3 with fluent speech, no focal motor/sensory deficits  LABORATORY DATA:  I have reviewed the data as listed CBC Latest Ref Rng & Units 01/12/2021 09/30/2020 07/05/2020  WBC 4.0 - 10.5 K/uL 5.7 5.2 5.0  Hemoglobin 12.0 - 15.0 g/dL 13.0 12.9 13.4  Hematocrit 36.0 - 46.0 % 39.5 39.9 40.6  Platelets 150 - 400 K/uL 448(H) 473(H) 422(H)     CMP Latest Ref Rng & Units 09/30/2020 04/02/2020 11/03/2019  Glucose 70 - 99 mg/dL 94 94 106(H)  BUN 8 - 23 mg/dL 7(L) 19 14  Creatinine 0.44 - 1.00 mg/dL 0.84 0.97 1.03(H)  Sodium 135 - 145 mmol/L 144 142 144  Potassium 3.5  - 5.1 mmol/L 4.4 4.0 4.0  Chloride 98 - 111 mmol/L 107 107 106  CO2 22 - 32 mmol/L _0 Calcium 8.9 - 10.3 mg/dL 9.9 9.5 9.9  Total Protein 6.5 - 8.1 g/dL 7.2 6.7 7.4  Total Bilirubin 0.3 - 1.2 mg/dL 0.5 0.5 0.3  Alkaline Phos 38 - 126 U/L 68 61 71  AST 15 - 41 U/L _1 ALT 0 - 44 U/L _2 RADIOGRAPHIC STUDIES: I have personally reviewed the radiological images as listed and agreed with the findings in the report. No results found.   ASSESSMENT & PLAN:  Danielle Oconnell is a 86 y.o. female with    1. Essential Thrombocythemia, JAK2(+) -Her 07/23/18 Bone Marrow biopsy showed hypercellular bone marrow, with megakaryocyte proliferation and her genetic mutation testing results showed JAK2(+), this is consistent with essential thrombocythemia. -She has tried both Anagrelide and hydrea. Given her concerns offatigue and having heart palpitations while on Anagrelide, I switched her back to Hydrea. She is on current dose  549m MWFS and 10058mthe rest of the week as of 04/02/20.Goal plt<500K. -She is clinically doing well and stable. She tolerates Hydrea well. Labs reviewed, plt at 448K. Will continues Hydrea at same dose, 50051mn MWFS and 1000m19me rest of the week.  -Lab in 3 months, f/u in 6 months   2. Fatigue, weight gain, abdominal bloating, LE edema  -Her fatigue now fluctuates but manageable with rest as needed.  -She has gained 16 pounds since 08/2019 and notes upper abdominal bloating as well even with healthy diet.  -Her 03/31/20 [place] was unremarkable. -Her PCP started her on Lasix she is currently on 20mg59mix as needed with 10mEq35massium.  -She has had leg cramps for the past 2-3 days. I recommend she drink more tonic water and use calcium, potassium, magnesium, but given IBS, she can use multivitamin.   3. Family history of cancer  -Closest living relative with cancer is her daughter, who was diagnosed with breast cancer at age 47. -I9previously recommended she discuss this with her daughter and suggests she get genetic testing.  4. Health maintenance, disease prevention  -She continues f/u with PCP, GI   PLAN -plt 448K today.  -Continue Hydrea at same dose 500mg d26m on MWFSand 1000mgdai96me rest of the week. I refilled today  -[facility] in [facility] months locally or [facility]  [facility]    No problem-specific Assessment & Plan notes found for this encounter.   No orders of the defined types were placed in this encounter.  All questions were answered. The patient knows to call the clinic with any problems, questions or concerns. No barriers to learning was detected. The total time spent in the appointment was 25 minutes.     Ryliee Figge FengTruitt Merle3/2022   I, Amoya BeJoslyn Devoning as scribe for Chayil Gantt FengTruitt MerleI have reviewed the above documentation for accuracy and completeness, and I agree with the above.

## 2021-01-12 ENCOUNTER — Inpatient Hospital Stay: Payer: Medicare HMO | Attending: Hematology | Admitting: Hematology

## 2021-01-12 ENCOUNTER — Other Ambulatory Visit: Payer: Self-pay

## 2021-01-12 ENCOUNTER — Encounter: Payer: Self-pay | Admitting: Hematology

## 2021-01-12 ENCOUNTER — Inpatient Hospital Stay: Payer: Medicare HMO

## 2021-01-12 VITALS — BP 127/60 | HR 72 | Temp 96.5°F | Resp 18 | Ht 61.0 in | Wt 195.6 lb

## 2021-01-12 DIAGNOSIS — D473 Essential (hemorrhagic) thrombocythemia: Secondary | ICD-10-CM | POA: Insufficient documentation

## 2021-01-12 DIAGNOSIS — E042 Nontoxic multinodular goiter: Secondary | ICD-10-CM | POA: Diagnosis not present

## 2021-01-12 DIAGNOSIS — Z79899 Other long term (current) drug therapy: Secondary | ICD-10-CM | POA: Insufficient documentation

## 2021-01-12 DIAGNOSIS — E041 Nontoxic single thyroid nodule: Secondary | ICD-10-CM | POA: Diagnosis not present

## 2021-01-12 DIAGNOSIS — Z90721 Acquired absence of ovaries, unilateral: Secondary | ICD-10-CM | POA: Diagnosis not present

## 2021-01-12 DIAGNOSIS — Z7982 Long term (current) use of aspirin: Secondary | ICD-10-CM | POA: Diagnosis not present

## 2021-01-12 DIAGNOSIS — Z90710 Acquired absence of both cervix and uterus: Secondary | ICD-10-CM | POA: Insufficient documentation

## 2021-01-12 DIAGNOSIS — R7989 Other specified abnormal findings of blood chemistry: Secondary | ICD-10-CM | POA: Diagnosis not present

## 2021-01-12 DIAGNOSIS — Z803 Family history of malignant neoplasm of breast: Secondary | ICD-10-CM | POA: Diagnosis not present

## 2021-01-12 DIAGNOSIS — K589 Irritable bowel syndrome without diarrhea: Secondary | ICD-10-CM | POA: Diagnosis not present

## 2021-01-12 LAB — CBC WITH DIFFERENTIAL (CANCER CENTER ONLY)
Abs Immature Granulocytes: 0.02 10*3/uL (ref 0.00–0.07)
Basophils Absolute: 0 10*3/uL (ref 0.0–0.1)
Basophils Relative: 1 %
Eosinophils Absolute: 0.1 10*3/uL (ref 0.0–0.5)
Eosinophils Relative: 1 %
HCT: 39.5 % (ref 36.0–46.0)
Hemoglobin: 13 g/dL (ref 12.0–15.0)
Immature Granulocytes: 0 %
Lymphocytes Relative: 30 %
Lymphs Abs: 1.7 10*3/uL (ref 0.7–4.0)
MCH: 34.2 pg — ABNORMAL HIGH (ref 26.0–34.0)
MCHC: 32.9 g/dL (ref 30.0–36.0)
MCV: 103.9 fL — ABNORMAL HIGH (ref 80.0–100.0)
Monocytes Absolute: 0.5 10*3/uL (ref 0.1–1.0)
Monocytes Relative: 9 %
Neutro Abs: 3.3 10*3/uL (ref 1.7–7.7)
Neutrophils Relative %: 59 %
Platelet Count: 448 10*3/uL — ABNORMAL HIGH (ref 150–400)
RBC: 3.8 MIL/uL — ABNORMAL LOW (ref 3.87–5.11)
RDW: 14.4 % (ref 11.5–15.5)
WBC Count: 5.7 10*3/uL (ref 4.0–10.5)
nRBC: 0 % (ref 0.0–0.2)

## 2021-01-12 MED ORDER — HYDROXYUREA 500 MG PO CAPS: ORAL_CAPSULE | ORAL | 5 refills | Status: DC

## 2021-01-12 NOTE — Telephone Encounter (Signed)
Dr. Burr Medico, This has been in the queue since 2/19 so sending to you for review. Thanks! Threasa Beards

## 2021-01-22 ENCOUNTER — Encounter: Payer: Self-pay | Admitting: Hematology

## 2021-03-25 DIAGNOSIS — Z9889 Other specified postprocedural states: Secondary | ICD-10-CM | POA: Diagnosis not present

## 2021-03-25 DIAGNOSIS — K6389 Other specified diseases of intestine: Secondary | ICD-10-CM | POA: Diagnosis not present

## 2021-03-25 DIAGNOSIS — D126 Benign neoplasm of colon, unspecified: Secondary | ICD-10-CM | POA: Diagnosis not present

## 2021-03-25 DIAGNOSIS — Z86010 Personal history of colonic polyps: Secondary | ICD-10-CM | POA: Diagnosis not present

## 2021-03-25 DIAGNOSIS — L905 Scar conditions and fibrosis of skin: Secondary | ICD-10-CM | POA: Diagnosis not present

## 2021-04-11 DIAGNOSIS — D691 Qualitative platelet defects: Secondary | ICD-10-CM | POA: Diagnosis not present

## 2021-04-13 ENCOUNTER — Other Ambulatory Visit: Payer: Medicare HMO

## 2021-05-29 ENCOUNTER — Other Ambulatory Visit: Payer: Self-pay | Admitting: Hematology

## 2021-06-20 DIAGNOSIS — R609 Edema, unspecified: Secondary | ICD-10-CM | POA: Diagnosis not present

## 2021-07-05 DIAGNOSIS — H43393 Other vitreous opacities, bilateral: Secondary | ICD-10-CM | POA: Diagnosis not present

## 2021-07-05 DIAGNOSIS — H2513 Age-related nuclear cataract, bilateral: Secondary | ICD-10-CM | POA: Diagnosis not present

## 2021-07-05 DIAGNOSIS — H5203 Hypermetropia, bilateral: Secondary | ICD-10-CM | POA: Diagnosis not present

## 2021-07-05 DIAGNOSIS — H04123 Dry eye syndrome of bilateral lacrimal glands: Secondary | ICD-10-CM | POA: Diagnosis not present

## 2021-07-16 NOTE — Telephone Encounter (Signed)
This encounter was created in error - please disregard.

## 2021-07-20 ENCOUNTER — Other Ambulatory Visit: Payer: Self-pay

## 2021-07-20 ENCOUNTER — Inpatient Hospital Stay: Payer: Medicare HMO | Attending: Hematology

## 2021-07-20 ENCOUNTER — Inpatient Hospital Stay: Payer: Medicare HMO | Admitting: Hematology

## 2021-07-20 VITALS — BP 147/74 | HR 69 | Temp 98.0°F | Resp 18 | Wt 196.0 lb

## 2021-07-20 DIAGNOSIS — Z79899 Other long term (current) drug therapy: Secondary | ICD-10-CM | POA: Diagnosis not present

## 2021-07-20 DIAGNOSIS — D75839 Thrombocytosis, unspecified: Secondary | ICD-10-CM | POA: Diagnosis not present

## 2021-07-20 DIAGNOSIS — Z90710 Acquired absence of both cervix and uterus: Secondary | ICD-10-CM | POA: Diagnosis not present

## 2021-07-20 DIAGNOSIS — K589 Irritable bowel syndrome without diarrhea: Secondary | ICD-10-CM | POA: Diagnosis not present

## 2021-07-20 DIAGNOSIS — Z7982 Long term (current) use of aspirin: Secondary | ICD-10-CM | POA: Diagnosis not present

## 2021-07-20 DIAGNOSIS — Z803 Family history of malignant neoplasm of breast: Secondary | ICD-10-CM | POA: Diagnosis not present

## 2021-07-20 DIAGNOSIS — D473 Essential (hemorrhagic) thrombocythemia: Secondary | ICD-10-CM | POA: Insufficient documentation

## 2021-07-20 DIAGNOSIS — Z90721 Acquired absence of ovaries, unilateral: Secondary | ICD-10-CM | POA: Diagnosis not present

## 2021-07-20 LAB — CBC WITH DIFFERENTIAL (CANCER CENTER ONLY)
Abs Immature Granulocytes: 0.01 10*3/uL (ref 0.00–0.07)
Basophils Absolute: 0 10*3/uL (ref 0.0–0.1)
Basophils Relative: 1 %
Eosinophils Absolute: 0.1 10*3/uL (ref 0.0–0.5)
Eosinophils Relative: 1 %
HCT: 40.8 % (ref 36.0–46.0)
Hemoglobin: 13.6 g/dL (ref 12.0–15.0)
Immature Granulocytes: 0 %
Lymphocytes Relative: 34 %
Lymphs Abs: 1.6 10*3/uL (ref 0.7–4.0)
MCH: 34.8 pg — ABNORMAL HIGH (ref 26.0–34.0)
MCHC: 33.3 g/dL (ref 30.0–36.0)
MCV: 104.3 fL — ABNORMAL HIGH (ref 80.0–100.0)
Monocytes Absolute: 0.5 10*3/uL (ref 0.1–1.0)
Monocytes Relative: 10 %
Neutro Abs: 2.7 10*3/uL (ref 1.7–7.7)
Neutrophils Relative %: 54 %
Platelet Count: 489 10*3/uL — ABNORMAL HIGH (ref 150–400)
RBC: 3.91 MIL/uL (ref 3.87–5.11)
RDW: 14 % (ref 11.5–15.5)
WBC Count: 4.8 10*3/uL (ref 4.0–10.5)
nRBC: 0 % (ref 0.0–0.2)

## 2021-07-20 LAB — CMP (CANCER CENTER ONLY)
ALT: 18 U/L (ref 0–44)
AST: 19 U/L (ref 15–41)
Albumin: 4.1 g/dL (ref 3.5–5.0)
Alkaline Phosphatase: 68 U/L (ref 38–126)
Anion gap: 8 (ref 5–15)
BUN: 16 mg/dL (ref 8–23)
CO2: 29 mmol/L (ref 22–32)
Calcium: 9.9 mg/dL (ref 8.9–10.3)
Chloride: 107 mmol/L (ref 98–111)
Creatinine: 0.91 mg/dL (ref 0.44–1.00)
GFR, Estimated: >60 mL/min (ref >60)
Glucose, Bld: 94 mg/dL (ref 70–99)
Potassium: 4 mmol/L (ref 3.5–5.1)
Sodium: 144 mmol/L (ref 135–145)
Total Bilirubin: 0.6 mg/dL (ref 0.3–1.2)
Total Protein: 7.1 g/dL (ref 6.5–8.1)

## 2021-07-20 MED ORDER — HYDROXYUREA 500 MG PO CAPS: ORAL_CAPSULE | ORAL | 1 refills | Status: DC

## 2021-07-20 NOTE — Progress Notes (Signed)
[place]   Telephone:([phone] Fax:(336) [phone]   Clinic Follow up Note   Patient Care Team: Lavone Orn, MD as PCP - General (Internal Medicine)  Date of Service:  07/20/2021  CHIEF COMPLAINT: f/u of essential Thrombocytosis, JAK2+  CURRENT THERAPY:  Hydrea 581m once daily starting 07/29/18, will increase to 505mon Mondays and Fridays and 100072mhe rest of the week on 08/24/2018.              -Due to pt's concerns of side effects from hydrea, Switched to Anagrelide on 11/2018. she started on 0.5mg80mice daily in 11/2018, increased to 1mg 58mand 0.5mg p33mn 01/28/2019. Held since 02/2019 due to back order               -Started Hydrea in 02/2019 and stopped in 04/2019 due to her concerns about risk of leukemia.              -Restart Anagrelide 1mg BI4mn 05/21/19. Stopped Anagrelide 09/02/19 due to fatigue and heart palpitations.              -Restarted hydrea 09/02/19 at 500mg in68mdays/Fridays and 1000mg the55mt of the week. Due to bloating, swelling and fatigue I reduced Hydrea to 500mg MWF 47m1000mg the r27mof the week on 01/30/20. Due to persistent symptoms will reduce Hydrea to 500mg on MWF59md 1000mg daily t70mest of the week starting 04/02/20.    ASSESSMENT & PLAN:  Danielle Oconnell femal86with   1. Essential Thrombocythemia, JAK2(+) -Her 07/23/18 Bone Marrow biopsy showed hypercellular bone marrow, with megakaryocyte proliferation and her  genetic mutation testing results showed JAK2(+), this is consistent with essential thrombocythemia. -She has tried both Anagrelide and hydrea. Given her concerns of fatigue and having heart palpitations while on Anagrelide, I switched her back to Hydrea. She is on current dose 500mg MWFS and21m0mg the rest 58mhe week as of 04/02/20. Goal plt<500K.  -She is clinically doing well and stable. She tolerates Hydrea well. Labs reviewed, plt at 489K. Will continues Hydrea at same dose, 500mg on MWFS an36m00mg the rest  o78me week.  -Lab in 3 months, f/u in 6 months   2. Fatigue, weight gain, abdominal bloating, LE edema  -Her fatigue now fluctuates but manageable with rest as needed.  -She has gained 16 pounds since 08/2019 and notes upper abdominal bloating as well even with healthy diet.  -Her 03/31/20 US abdomen was un[place]arkable.  -Her PCP started her on Lasix she is currently on 20mg Lasix as nee34mwith 10mEq potassium.  60m Family history of cancer  -Closest living relative with cancer is her daughter, who was diagnosed with breast cancer at age 84. -I previously r59ommended she discuss this with her daughter and suggests she get genetic testing.   4. Health maintenance, disease prevention  -She continues f/u with PCP, GI -most recent colonoscopy was 03/25/21, she will repeat annually -she has received covid vaccine x3 (has not gotten second booster) and her 2022 flu shot.     PLAN -Continue Hydrea at same dose 500mg daily on MWFS 97m1000mg daily the rest 57mhe week. I refilled today  -Lab in 3 months locally, I gave her lab order  -Lab and f/u in 6 months    No problem-specific Assessment & Plan notes found for this encounter.   INTERVAL HISTORY:  Danielle Oconnell is hIMANIE Oconnell up  of essential Thrombocytosis, JAK2+. She was last seen by me on 01/12/21. She presents to the clinic alone. She reports doing well overall with no new changes. She notes her energy level is up and down, depending on how much she works. She notes she tires easily if she stands for long. She denies any problem from the [facility] She notes she has IBS and manages her constipation with ClearLax.   All other systems were reviewed with the patient and are negative.  MEDICAL HISTORY:  Past Medical History:  Diagnosis Date   Arthritis    knees   Goiter    not a problem   History of hiatal hernia    Hx of seasonal allergies    Irritable bowel syndrome     SURGICAL HISTORY: Past Surgical History:   Procedure Laterality Date   ABDOMINAL HYSTERECTOMY     COLONOSCOPY W/ POLYPECTOMY     COLONOSCOPY WITH PROPOFOL N/A 11/08/2015   Procedure: COLONOSCOPY WITH PROPOFOL;  Surgeon: Garlan Fair, MD;  Location: [facility];  Service: Endoscopy;  Laterality: N/A;   LAPAROSCOPIC PARTIAL COLECTOMY N/A 02/12/2015   Procedure: DIAGNOSTIC LAPAROSCOPY WITH LYSIS OF ADHESIONS;  Surgeon: Michael Boston, MD;  Location: [facility];  Service: General;  Laterality: N/A;-"small bowel obstruction"   LEFT OOPHORECTOMY      I have reviewed the social history and family history with the patient and they are unchanged from previous note.  ALLERGIES:  is allergic to sulfa antibiotics.  MEDICATIONS:  Current Outpatient Medications  Medication Sig Dispense Refill   Ascorbic Acid (VITAMIN C) 1000 MG tablet Take 1,000 mg by mouth daily.     aspirin EC 81 MG tablet Take 81 mg by mouth daily.     COLLAGEN-VITAMIN C-BIOTIN PO Take 6 tablets by mouth daily.     Evening Primrose Oil 500 MG CAPS Take 1 capsule by mouth daily.     GINKGO BILOBA PO Take 120 mg by mouth daily.     Ginseng 100 MG CAPS Take 100 mg by mouth daily.     hydroxyurea (HYDREA) 500 MG capsule TAKE 1 CAPSULE BY MOUTH ON MONDAY, WEDNESDAY, FRIDAY AND SATURDAY. TAKE 2 CAPSULES ON ALL OTHER DAYS 128 capsule 1   Omega-3 Fatty Acids (FISH OIL) 1200 MG CAPS Take 1 capsule by mouth daily.     polyethylene glycol (MIRALAX / GLYCOLAX) 17 g packet Take 17 g by mouth daily.     Zinc 50 MG CAPS Take 1 capsule by mouth daily.     No current facility-administered medications for this visit.    PHYSICAL EXAMINATION: ECOG PERFORMANCE STATUS: 1 - Symptomatic but completely ambulatory  Vitals:   084/31/22 1140  BP: (!) 147/74  Pulse: 69  Resp: 18  Temp: 98 F (36.7 C)  SpO2: 96%   Wt Readings from Last 3 Encounters:  07/20/21 196 lb (88.9 kg)  01/12/21 195 lb 9.6 oz (88.7 kg)  07/05/20 197 lb 14.4 oz (89.8 kg)     GENERAL:alert, no distress and  comfortable SKIN: skin color normal, no rashes or significant lesions EYES: normal, Conjunctiva are pink and non-injected, sclera clear  NEURO: alert & oriented x 3 with fluent speech  LABORATORY DATA:  I have reviewed the data as listed CBC Latest Ref Rng & Units 07/20/2021 01/12/2021 09/30/2020  WBC 4.0 - 10.5 K/uL 4.8 5.7 5.2  Hemoglobin 12.0 - 15.0 g/dL 13.6 13.0 12.9  Hematocrit 36.0 - 46.0 % 40.8 39.5 39.9  Platelets 150 - 400 K/uL 489(H)  448(H) 473(H)     CMP Latest Ref Rng & Units 07/20/2021 09/30/2020 04/02/2020  Glucose 70 - 99 mg/dL 94 94 94  BUN 8 - 23 mg/dL 16 7(L) 19  Creatinine 0.44 - 1.00 mg/dL 0.91 0.84 0.97  Sodium 135 - 145 mmol/L 144 144 142  Potassium 3.5 - 5.1 mmol/L 4.0 4.4 4.0  Chloride 98 - 111 mmol/L 107 107 107  CO2 22 - 32 mmol/L 29 31 28   Calcium 8.9 - 10.3 mg/dL 9.9 9.9 9.5  Total Protein 6.5 - 8.1 g/dL 7.1 7.2 6.7  Total Bilirubin 0.3 - 1.2 mg/dL 0.6 0.5 0.5  Alkaline Phos 38 - 126 U/L 68 68 61  AST 15 - 41 U/L 19 20 18   ALT 0 - 44 U/L 18 12 13       RADIOGRAPHIC STUDIES: I have personally reviewed the radiological images as listed and agreed with the findings in the report. No results found.    No orders of the defined types were placed in this encounter.  All questions were answered. The patient knows to call the clinic with any problems, questions or concerns. No barriers to learning was detected. The total time spent in the appointment was 20 minutes.     Truitt Merle, MD 07/20/2021   I, Wilburn Mylar, am acting as scribe for Truitt Merle, MD.   I have reviewed the above documentation for accuracy and completeness, and I agree with the above.

## 2021-07-29 DIAGNOSIS — E042 Nontoxic multinodular goiter: Secondary | ICD-10-CM | POA: Diagnosis not present

## 2021-08-01 ENCOUNTER — Other Ambulatory Visit: Payer: Self-pay | Admitting: Hematology

## 2021-08-02 DIAGNOSIS — R946 Abnormal results of thyroid function studies: Secondary | ICD-10-CM | POA: Diagnosis not present

## 2021-08-02 DIAGNOSIS — E042 Nontoxic multinodular goiter: Secondary | ICD-10-CM | POA: Diagnosis not present

## 2021-09-07 DIAGNOSIS — E041 Nontoxic single thyroid nodule: Secondary | ICD-10-CM | POA: Diagnosis not present

## 2021-09-12 DIAGNOSIS — R030 Elevated blood-pressure reading, without diagnosis of hypertension: Secondary | ICD-10-CM | POA: Diagnosis not present

## 2021-09-12 DIAGNOSIS — E042 Nontoxic multinodular goiter: Secondary | ICD-10-CM | POA: Diagnosis not present

## 2021-09-12 DIAGNOSIS — D473 Essential (hemorrhagic) thrombocythemia: Secondary | ICD-10-CM | POA: Diagnosis not present

## 2021-09-12 DIAGNOSIS — Z86010 Personal history of colonic polyps: Secondary | ICD-10-CM | POA: Diagnosis not present

## 2021-09-12 DIAGNOSIS — Z0000 Encounter for general adult medical examination without abnormal findings: Secondary | ICD-10-CM | POA: Diagnosis not present

## 2021-09-12 DIAGNOSIS — Z1389 Encounter for screening for other disorder: Secondary | ICD-10-CM | POA: Diagnosis not present

## 2021-09-12 DIAGNOSIS — R079 Chest pain, unspecified: Secondary | ICD-10-CM | POA: Diagnosis not present

## 2021-09-12 DIAGNOSIS — Z23 Encounter for immunization: Secondary | ICD-10-CM | POA: Diagnosis not present

## 2021-09-12 DIAGNOSIS — M1711 Unilateral primary osteoarthritis, right knee: Secondary | ICD-10-CM | POA: Diagnosis not present

## 2021-10-19 DIAGNOSIS — D473 Essential (hemorrhagic) thrombocythemia: Secondary | ICD-10-CM | POA: Diagnosis not present

## 2021-10-20 ENCOUNTER — Telehealth: Payer: Self-pay

## 2021-10-20 NOTE — Telephone Encounter (Signed)
Pt called to get CBC test results.  Pt had labs drawn at her local hospital in [place], [place] on 10/19/2021.  Lab results have not been faxed or uploaded to Epic.  [facility] was unable to give pt her lab results d/t this.  Pt stated she will contact the lab in [place] to see why they have not sent [facility] her lab results.

## 2021-10-28 ENCOUNTER — Other Ambulatory Visit: Payer: Self-pay

## 2021-11-01 ENCOUNTER — Other Ambulatory Visit: Payer: Self-pay

## 2021-11-01 ENCOUNTER — Telehealth: Payer: Self-pay

## 2021-11-01 NOTE — Telephone Encounter (Signed)
Pt called for lab results drawn on 10/19/2021 @[facility].  Pt's plts 530.  Pt stated Dr. Burr Medico would like for her to keep her plts below 500.  Pt stated her next appt with Dr. Burr Medico is not until 01/16/2022.  Informed pt that this RN will let Dr. Burr Medico know about the plts and will let her know what Dr. Burr Medico recommends.

## 2021-11-02 ENCOUNTER — Other Ambulatory Visit: Payer: Self-pay

## 2021-11-02 MED ORDER — HYDROXYUREA 500 MG PO CAPS: ORAL_CAPSULE | ORAL | 2 refills | Status: DC

## 2021-11-04 DIAGNOSIS — S299XXA Unspecified injury of thorax, initial encounter: Secondary | ICD-10-CM | POA: Diagnosis not present

## 2021-11-04 DIAGNOSIS — S20212A Contusion of left front wall of thorax, initial encounter: Secondary | ICD-10-CM | POA: Diagnosis not present

## 2021-11-04 DIAGNOSIS — G8911 Acute pain due to trauma: Secondary | ICD-10-CM | POA: Diagnosis not present

## 2022-01-15 ENCOUNTER — Other Ambulatory Visit: Payer: Self-pay | Admitting: Hematology

## 2022-01-16 ENCOUNTER — Inpatient Hospital Stay: Payer: Medicare HMO | Admitting: Hematology

## 2022-01-16 ENCOUNTER — Other Ambulatory Visit: Payer: Self-pay

## 2022-01-16 ENCOUNTER — Inpatient Hospital Stay: Payer: Medicare HMO | Attending: Hematology

## 2022-01-16 ENCOUNTER — Encounter: Payer: Self-pay | Admitting: Hematology

## 2022-01-16 VITALS — BP 159/70 | HR 81 | Temp 98.1°F | Resp 18 | Ht 61.0 in | Wt 196.8 lb

## 2022-01-16 DIAGNOSIS — D473 Essential (hemorrhagic) thrombocythemia: Secondary | ICD-10-CM | POA: Insufficient documentation

## 2022-01-16 DIAGNOSIS — Z90710 Acquired absence of both cervix and uterus: Secondary | ICD-10-CM | POA: Insufficient documentation

## 2022-01-16 DIAGNOSIS — D75839 Thrombocytosis, unspecified: Secondary | ICD-10-CM | POA: Insufficient documentation

## 2022-01-16 LAB — CBC WITH DIFFERENTIAL (CANCER CENTER ONLY)
Abs Immature Granulocytes: 0.02 10*3/uL (ref 0.00–0.07)
Basophils Absolute: 0.1 10*3/uL (ref 0.0–0.1)
Basophils Relative: 1 %
Eosinophils Absolute: 0 10*3/uL (ref 0.0–0.5)
Eosinophils Relative: 1 %
HCT: 42 % (ref 36.0–46.0)
Hemoglobin: 13.6 g/dL (ref 12.0–15.0)
Immature Granulocytes: 0 %
Lymphocytes Relative: 20 %
Lymphs Abs: 1.4 10*3/uL (ref 0.7–4.0)
MCH: 33.9 pg (ref 26.0–34.0)
MCHC: 32.4 g/dL (ref 30.0–36.0)
MCV: 104.7 fL — ABNORMAL HIGH (ref 80.0–100.0)
Monocytes Absolute: 0.5 10*3/uL (ref 0.1–1.0)
Monocytes Relative: 7 %
Neutro Abs: 5.1 10*3/uL (ref 1.7–7.7)
Neutrophils Relative %: 71 %
Platelet Count: 554 10*3/uL — ABNORMAL HIGH (ref 150–400)
RBC: 4.01 MIL/uL (ref 3.87–5.11)
RDW: 13.8 % (ref 11.5–15.5)
WBC Count: 7.1 10*3/uL (ref 4.0–10.5)
nRBC: 0 % (ref 0.0–0.2)

## 2022-01-16 LAB — CMP (CANCER CENTER ONLY)
ALT: 14 U/L (ref 0–44)
AST: 19 U/L (ref 15–41)
Albumin: 4.2 g/dL (ref 3.5–5.0)
Alkaline Phosphatase: 69 U/L (ref 38–126)
Anion gap: 4 — ABNORMAL LOW (ref 5–15)
BUN: 13 mg/dL (ref 8–23)
CO2: 30 mmol/L (ref 22–32)
Calcium: 10 mg/dL (ref 8.9–10.3)
Chloride: 108 mmol/L (ref 98–111)
Creatinine: 0.85 mg/dL (ref 0.44–1.00)
GFR, Estimated: >60 mL/min (ref >60)
Glucose, Bld: 107 mg/dL — ABNORMAL HIGH (ref 70–99)
Potassium: 4.1 mmol/L (ref 3.5–5.1)
Sodium: 142 mmol/L (ref 135–145)
Total Bilirubin: 0.5 mg/dL (ref 0.3–1.2)
Total Protein: 7 g/dL (ref 6.5–8.1)

## 2022-01-16 MED ORDER — ANAGRELIDE HCL 0.5 MG PO CAPS: 0.5000 mg | ORAL_CAPSULE | Freq: Two times a day (BID) | ORAL | 0 refills | Status: DC

## 2022-01-16 NOTE — Progress Notes (Addendum)
[place]   Telephone:(336) [phone] Fax:(336) [phone]   Clinic Follow up Note   Patient Care Team: Lavone Orn, MD as PCP - General (Internal Medicine)  Date of Service:  01/16/2022  CHIEF COMPLAINT: f/u of essential thrombocytosis, JAK2+  CURRENT THERAPY:  Hydrea or Anagrelide since 07/2018, currently Hydrea 500 mg 3 days a week, 1000 mg 4 days a week.  ASSESSMENT & PLAN:  Danielle Oconnell is a 87 y.o. female with   1. Essential Thrombocythemia, JAK2(+) -Her 07/23/18 Bone Marrow biopsy showed hypercellular bone marrow, with megakaryocyte proliferation and her  genetic mutation testing results showed JAK2(+), this is consistent with essential thrombocythemia. -She has tried both Anagrelide and hydrea. Given her concerns of fatigue and having heart palpitations while on Anagrelide, I switched her back to Hydrea. Her dose has been adjusted several times due to side effects of bloating, swelling, and fatigue. She is on current dose 532m MWF and 10053mthe rest of the week as of 11/01/21. Goal plt<500K.  -she reports today she does not want to take the hydrea anymore as it causes dizziness and joint pain. Labs reviewed, plt further elevated at 554k since she has been off the medicine 2 days ago. She would like to try the anagrelide again. She previously had Anagrelide but had palpitation and fatigue. We will start her on a low dose. I again reviewed potential side effects    2. Family history of cancer  -Closest living relative with cancer is her daughter, who was diagnosed with breast cancer at age 85-I previously recommended she discuss this with her daughter and suggests she get genetic testing.   3. Health maintenance, disease prevention  -She continues f/u with PCP, GI -most recent colonoscopy was 03/25/21, she will repeat annually -she has received covid vaccine x3 (has not gotten second booster) and her 2022 flu shot.     PLAN -stop hydrea per her request,  restart low-dose anagrelide 0.60m27mid, if she tolerates well for  2 weeks, will increase to 1mg8md  -lab at local hospital in 4 weeks and 3 months  -phone visit in 5 weeks -Lab and f/u in 6 months    No problem-specific Assessment & Plan notes found for this encounter.   INTERVAL HISTORY:  Danielle Oconnell for a follow up of thrombocytosis. She was last seen by me on 07/20/21. She presents to the clinic alone. She reports she ran out of Hydrea; she states she was taking a lower dose to make it last longer but ultimately ran out on Saturday (2/25). She reports she had issues with her insurance obtaining the new, increased prescription. However, she reports today that she does not want to take it anymore, as it causes dizziness, joint pains.   All other systems were reviewed with the patient and are negative.  MEDICAL HISTORY:  Past Medical History:  Diagnosis Date   Arthritis    knees   Goiter    not a problem   History of hiatal hernia    Hx of seasonal allergies    Irritable bowel syndrome     SURGICAL HISTORY: Past Surgical History:  Procedure Laterality Date   ABDOMINAL HYSTERECTOMY     COLONOSCOPY W/ POLYPECTOMY     COLONOSCOPY WITH PROPOFOL N/A 11/08/2015   Procedure: COLONOSCOPY WITH PROPOFOL;  Surgeon: MartGarlan Fair;  [facility]: [facility];  Service: Endoscopy;  Laterality: N/A;   LAPAROSCOPIC PARTIAL COLECTOMY N/A 02/12/2015   Procedure: DIAGNOSTIC  LAPAROSCOPY WITH LYSIS OF ADHESIONS;  Surgeon: Michael Boston, MD;  Location: [facility];  Service: General;  Laterality: N/A;-"small bowel obstruction"   LEFT OOPHORECTOMY      I have reviewed the social history and family history with the patient and they are unchanged from previous note.  ALLERGIES:  is allergic to sulfa antibiotics.  MEDICATIONS:  Current Outpatient Medications  Medication Sig Dispense Refill   anagrelide (AGRYLIN) 0.5 MG capsule Take 1 capsule (0.5 mg total) by mouth 2 (two) times daily.  If she tolerates well for 2 weeks, then increase to 55m twice daily 100 capsule 0   Ascorbic Acid (VITAMIN C) 1000 MG tablet Take 1,000 mg by mouth daily.     aspirin EC 81 MG tablet Take 81 mg by mouth daily.     COLLAGEN-VITAMIN C-BIOTIN PO Take 6 tablets by mouth daily.     Evening Primrose Oil 500 MG CAPS Take 1 capsule by mouth daily.     GINKGO BILOBA PO Take 120 mg by mouth daily.     Ginseng 100 MG CAPS Take 100 mg by mouth daily.     hydroxyurea (HYDREA) 500 MG capsule TAKE 1 CAPSULE BY MOUTH ON MONDAY, WEDNESDAY, FRIDAY AND SATURDAY, THEN 2 CAPSULES ON ALL OTHER DAYS 128 capsule 0   Omega-3 Fatty Acids (FISH OIL) 1200 MG CAPS Take 1 capsule by mouth daily.     polyethylene glycol (MIRALAX / GLYCOLAX) 17 g packet Take 17 g by mouth daily.     Zinc 50 MG CAPS Take 1 capsule by mouth daily.     No current facility-administered medications for this visit.    PHYSICAL EXAMINATION: ECOG PERFORMANCE STATUS: 1 - Symptomatic but completely ambulatory  Vitals:   01/16/22 1228  BP: (!) 159/70  Pulse: 81  Resp: 18  Temp: 98.1 F (36.7 C)  SpO2: 98%   Wt Readings from Last 3 Encounters:  01/16/22 196 lb 12.8 oz (89.3 kg)  07/20/21 196 lb (88.9 kg)  01/12/21 195 lb 9.6 oz (88.7 kg)     GENERAL:alert, no distress and comfortable SKIN: skin color normal, no rashes or significant lesions EYES: normal, Conjunctiva are pink and non-injected, sclera clear  NEURO: alert & oriented x 3 with fluent speech  LABORATORY DATA:  I have reviewed the data as listed CBC Latest Ref Rng & Units 01/16/2022 07/20/2021 01/12/2021  WBC 4.0 - 10.5 K/uL 7.1 4.8 5.7  Hemoglobin 12.0 - 15.0 g/dL 13.6 13.6 13.0  Hematocrit 36.0 - 46.0 % 42.0 40.8 39.5  Platelets 150 - 400 K/uL 554(H) 489(H) 448(H)     CMP Latest Ref Rng & Units 01/16/2022 07/20/2021 09/30/2020  Glucose 70 - 99 mg/dL 107(H) 94 94  BUN 8 - 23 mg/dL 13 16 7(L)  Creatinine 0.44 - 1.00 mg/dL 0.85 0.91 0.84  Sodium 135 - 145 mmol/L 142 144  144  Potassium 3.5 - 5.1 mmol/L 4.1 4.0 4.4  Chloride 98 - 111 mmol/L 108 107 107  CO2 22 - 32 mmol/L _0 Calcium 8.9 - 10.3 mg/dL 10.0 9.9 9.9  Total Protein 6.5 - 8.1 g/dL 7.0 7.1 7.2  Total Bilirubin 0.3 - 1.2 mg/dL 0.5 0.6 0.5  Alkaline Phos 38 - 126 U/L 69 68 68  AST 15 - 41 U/L _1 ALT 0 - 44 U/L _2 RADIOGRAPHIC STUDIES: I have personally reviewed the radiological images as listed and agreed with the findings in the report.  No results found.    No orders of the defined types were placed in this encounter.  All questions were answered. The patient knows to call the clinic with any problems, questions or concerns. No barriers to learning was detected. The total time spent in the appointment was 20 minutes.     Truitt Merle, MD 01/16/2022   I, Wilburn Mylar, am acting as scribe for Truitt Merle, MD.   I have reviewed the above documentation for accuracy and completeness, and I agree with the above.

## 2022-01-26 DIAGNOSIS — Z86010 Personal history of colonic polyps: Secondary | ICD-10-CM | POA: Diagnosis not present

## 2022-01-26 DIAGNOSIS — E042 Nontoxic multinodular goiter: Secondary | ICD-10-CM | POA: Diagnosis not present

## 2022-01-26 DIAGNOSIS — D473 Essential (hemorrhagic) thrombocythemia: Secondary | ICD-10-CM | POA: Diagnosis not present

## 2022-02-01 DIAGNOSIS — R509 Fever, unspecified: Secondary | ICD-10-CM | POA: Diagnosis not present

## 2022-02-01 DIAGNOSIS — Z79899 Other long term (current) drug therapy: Secondary | ICD-10-CM | POA: Diagnosis not present

## 2022-02-01 DIAGNOSIS — J069 Acute upper respiratory infection, unspecified: Secondary | ICD-10-CM | POA: Diagnosis not present

## 2022-02-01 DIAGNOSIS — Z20822 Contact with and (suspected) exposure to covid-19: Secondary | ICD-10-CM | POA: Diagnosis not present

## 2022-02-09 DIAGNOSIS — J069 Acute upper respiratory infection, unspecified: Secondary | ICD-10-CM | POA: Diagnosis not present

## 2022-02-09 DIAGNOSIS — R059 Cough, unspecified: Secondary | ICD-10-CM | POA: Diagnosis not present

## 2022-02-15 ENCOUNTER — Other Ambulatory Visit: Payer: Self-pay | Admitting: Hematology

## 2022-02-15 ENCOUNTER — Telehealth: Payer: Self-pay

## 2022-02-15 ENCOUNTER — Other Ambulatory Visit: Payer: Self-pay

## 2022-02-15 MED ORDER — ANAGRELIDE HCL 1 MG PO CAPS: 1.0000 mg | ORAL_CAPSULE | Freq: Two times a day (BID) | ORAL | 0 refills | Status: DC

## 2022-02-15 NOTE — Telephone Encounter (Signed)
Spoke with pt via telephone to regarding new prescription for Agrylin.  Pt verbalized understanding of change in prescription and will pick-up prescription from her preferred pharmacy ([facility] in [place], [place]).  Pt had no further questions or concerns at this time regarding her prescription change by Dr. Burr Medico. ?

## 2022-02-28 DIAGNOSIS — K5900 Constipation, unspecified: Secondary | ICD-10-CM | POA: Diagnosis not present

## 2022-02-28 DIAGNOSIS — Z86010 Personal history of colonic polyps: Secondary | ICD-10-CM | POA: Diagnosis not present

## 2022-03-16 ENCOUNTER — Telehealth: Payer: Self-pay | Admitting: Hematology

## 2022-03-16 NOTE — Telephone Encounter (Signed)
.  Called patient to schedule appointment per 4/37 inbasket, patient is aware of date and time.   ?

## 2022-03-20 ENCOUNTER — Telehealth: Payer: Medicare HMO | Admitting: Hematology

## 2022-03-20 DIAGNOSIS — K5730 Diverticulosis of large intestine without perforation or abscess without bleeding: Secondary | ICD-10-CM | POA: Diagnosis not present

## 2022-03-20 DIAGNOSIS — Z86010 Personal history of colonic polyps: Secondary | ICD-10-CM | POA: Diagnosis not present

## 2022-03-20 DIAGNOSIS — D126 Benign neoplasm of colon, unspecified: Secondary | ICD-10-CM | POA: Diagnosis not present

## 2022-03-20 DIAGNOSIS — D122 Benign neoplasm of ascending colon: Secondary | ICD-10-CM | POA: Diagnosis not present

## 2022-03-20 DIAGNOSIS — D175 Benign lipomatous neoplasm of intra-abdominal organs: Secondary | ICD-10-CM | POA: Diagnosis not present

## 2022-03-20 DIAGNOSIS — Z1211 Encounter for screening for malignant neoplasm of colon: Secondary | ICD-10-CM | POA: Diagnosis not present

## 2022-03-21 DIAGNOSIS — E049 Nontoxic goiter, unspecified: Secondary | ICD-10-CM | POA: Diagnosis not present

## 2022-03-21 DIAGNOSIS — Z9889 Other specified postprocedural states: Secondary | ICD-10-CM | POA: Diagnosis not present

## 2022-03-21 DIAGNOSIS — R221 Localized swelling, mass and lump, neck: Secondary | ICD-10-CM | POA: Diagnosis not present

## 2022-03-30 ENCOUNTER — Encounter: Payer: Self-pay | Admitting: Hematology

## 2022-03-30 ENCOUNTER — Inpatient Hospital Stay: Payer: Medicare HMO | Attending: Hematology | Admitting: Hematology

## 2022-03-30 DIAGNOSIS — D473 Essential (hemorrhagic) thrombocythemia: Secondary | ICD-10-CM | POA: Diagnosis not present

## 2022-03-30 NOTE — Progress Notes (Signed)
?[place]   ?[phone] [phone]   ?Clinic Follow up Note  ? ?Patient Care Team: ?Lavone Orn, MD as PCP - General (Internal Medicine) ? ?Date of Service:  03/30/2022 ? ?I connected with Maylon Peppers on 03/30/2022 at 12:00 PM EDT by video enabled telemedicine visit and verified that I am speaking with the correct person using two identifiers.  ?I discussed the limitations, risks, security and privacy concerns of performing an evaluation and management service by telephone and the availability of in person appointments. I also discussed with the patient that there may be a patient responsible charge related to this service. The patient expressed understanding and agreed to proceed.  ? ?Other persons participating in the visit and their role in the encounter:  none ? ?[facility]'s location[facility]  [facility] ?Provider's location:  my office ? ?CHIEF COMPLAINT: f/u of essential thrombocytosis, JAK2+ ? ?CURRENT THERAPY:  ?Hydrea or Anagrelide since 07/2018, currently Anagrelide 1 mg BID ? ?ASSESSMENT & PLAN:  ?KALENE CUTLER is a 87 y.o. female with  ? ?1. Essential Thrombocythemia, JAK2(+) ?-Her 07/23/18 Bone Marrow biopsy showed hypercellular bone marrow, with megakaryocyte proliferation and her  genetic mutation testing results showed JAK2(+), this is consistent with essential thrombocythemia. ?-She has tried both Anagrelide and hydrea. She experiences fatigue on both medicines, heart palpitations on anagrelide, and bloating, dizziness, and joint pain on hydrea. Goal plt<500K.  ?-due to dizziness and joint pain, we switched her back to anagrelide at her last visit on 01/16/22. She is currently on 1 mg BID, since end of March. She has not had lab work done since 02/14/22; I asked her to obtain repeated lab in the next 2-3 weeks, and she is agreeable. ?-due to her fatigue and abdominal bloating,I will also order abdomen US to be performed the same day as her next visit, 8/28. ?  ?2.  Family history of cancer  ?-Closest living relative with cancer is her daughter, who was diagnosed with breast cancer at age 37. ?-I previously recommended she discuss this with her daughter and suggests she get genetic testing. ?  ?3. Health maintenance, disease prevention  ?-She continues f/u with PCP, GI ?-most recent colonoscopy was 03/20/22, she will repeat annually ?-she has received covid vaccine x3 (has not gotten second booster) and her 2022 flu shot. ?  ?  ?PLAN ?-continue anagrelide 22m bid  ?-lab at local hospital in 2-3 weeks  ?-Lab, f/u, and abdominal [place] 8[place]  ? ? ?No problem-specific Assessment & Plan notes found for this encounter. ? ? ?INTERVAL HISTORY:  ?LWILLARD MADRIGALwas contacted for a follow up of thrombosis. She was last seen by me on 01/16/22. ?She reports she is doing well overall. She notes her dizziness has resolved off the hydrea, but she reports continued fatigue, shortness of breath with exertion, and bloating.  ?  ?All other systems were reviewed with the patient and are negative. ? ?MEDICAL HISTORY:  ?Past Medical History:  ?Diagnosis Date  ? Arthritis   ? knees  ? Goiter   ? not a problem  ? History of hiatal hernia   ? Hx of seasonal allergies   ? Irritable bowel syndrome   ? ? ?SURGICAL HISTORY: ?Past Surgical History:  ?Procedure Laterality Date  ? ABDOMINAL HYSTERECTOMY    ? COLONOSCOPY W/ POLYPECTOMY    ? COLONOSCOPY WITH PROPOFOL N/A 11/08/2015  ? Procedure: COLONOSCOPY WITH PROPOFOL;  Surgeon: MGarlan Fair MD;  Location: [facility];  Service:  Endoscopy;  Laterality: N/A;  ? LAPAROSCOPIC PARTIAL COLECTOMY N/A 02/12/2015  ? Procedure: DIAGNOSTIC LAPAROSCOPY WITH LYSIS OF ADHESIONS;  Surgeon: Michael Boston, MD;  Location: [facility];  Service: General;  Laterality: N/A;-"small bowel obstruction"  ? LEFT OOPHORECTOMY    ? ? ?I have reviewed the social history and family history with the patient and they are unchanged from previous note. ? ?ALLERGIES:  is allergic to sulfa  antibiotics. ? ?MEDICATIONS:  ?Current Outpatient Medications  ?Medication Sig Dispense Refill  ? anagrelide (AGRYLIN) 1 MG capsule Take 1 capsule (1 mg total) by mouth 2 (two) times daily. 180 capsule 0  ? Ascorbic Acid (VITAMIN C) 1000 MG tablet Take 1,000 mg by mouth daily.    ? aspirin EC 81 MG tablet Take 81 mg by mouth daily.    ? COLLAGEN-VITAMIN C-BIOTIN PO Take 6 tablets by mouth daily.    ? Evening Primrose Oil 500 MG CAPS Take 1 capsule by mouth daily.    ? GINKGO BILOBA PO Take 120 mg by mouth daily.    ? Ginseng 100 MG CAPS Take 100 mg by mouth daily.    ? hydroxyurea (HYDREA) 500 MG capsule TAKE 1 CAPSULE BY MOUTH ON MONDAY, WEDNESDAY, FRIDAY AND SATURDAY, THEN 2 CAPSULES ON ALL OTHER DAYS 128 capsule 0  ? Omega-3 Fatty Acids (FISH OIL) 1200 MG CAPS Take 1 capsule by mouth daily.    ? polyethylene glycol (MIRALAX / GLYCOLAX) 17 g packet Take 17 g by mouth daily.    ? Zinc 50 MG CAPS Take 1 capsule by mouth daily.    ? ?No current facility-administered medications for this visit.  ? ? ?PHYSICAL EXAMINATION: ?ECOG PERFORMANCE STATUS: 1 - Symptomatic but completely ambulatory ? ?There were no vitals filed for this visit. ?Wt Readings from Last 3 Encounters:  ?01/16/22 196 lb 12.8 oz (89.3 kg)  ?07/20/21 196 lb (88.9 kg)  ?01/12/21 195 lb 9.6 oz (88.7 kg)  ?  ? ?No vitals taken today, Exam not performed today ? ?LABORATORY DATA:  ?I have reviewed the data as listed ? ?  Latest Ref Rng & Units 01/16/2022  ? 11:18 AM 07/20/2021  ? 11:07 AM 01/12/2021  ? 10:26 AM  ?CBC  ?WBC 4.0 - 10.5 K/uL 7.1   4.8   5.7    ?Hemoglobin 12.0 - 15.0 g/dL 13.6   13.6   13.0    ?Hematocrit 36.0 - 46.0 % 42.0   40.8   39.5    ?Platelets 150 - 400 K/uL 554   489   448    ? ? ? ? ?  Latest Ref Rng & Units 01/16/2022  ? 11:18 AM 07/20/2021  ? 11:07 AM 09/30/2020  ?  1:14 PM  ?CMP  ?Glucose 70 - 99 mg/dL 107   94   94    ?BUN 8 - 23 mg/dL 13   16   7     ?Creatinine 0.44 - 1.00 mg/dL 0.85   0.91   0.84    ?Sodium 135 - 145 mmol/L 142    144   144    ?Potassium 3.5 - 5.1 mmol/L 4.1   4.0   4.4    ?Chloride 98 - 111 mmol/L 108   107   107    ?CO2 22 - 32 mmol/L 30   29   31     ?Calcium 8.9 - 10.3 mg/dL 10.0   9.9   9.9    ?Total Protein 6.5 - 8.1 g/dL 7.0  7.1   7.2    ?Total Bilirubin 0.3 - 1.2 mg/dL 0.5   0.6   0.5    ?Alkaline Phos 38 - 126 U/L 69   68   68    ?AST 15 - 41 U/L 19   19   20     ?ALT 0 - 44 U/L 14   18   12     ? ? ? ? ?RADIOGRAPHIC STUDIES: ?I have personally reviewed the radiological images as listed and agreed with the findings in the report. ?No results found.  ? ? ?Orders Placed This Encounter  ?Procedures  ? US Abdomen Complete  ?  Standing Status:   Future  ?  Standing Expiration Date:   03/30/2023  ?  Order Specific Question:   Reason for Exam (SYMPTOM  OR DIAGNOSIS REQUIRED)  ?  Answer:   abdominal bloating, evaluate liver and spleen  ?  Order Specific Question:   Preferred imaging location?  ?  Answer:   [facility] ([facility])  ? ?All questions were answered. The patient knows to call the clinic with any problems, questions or concerns. No barriers to learning was detected. ?The total time spent in the appointment was 20 minutes. ? ?  ? Truitt Merle, MD ?03/30/2022  ? ?I, Wilburn Mylar, am acting as scribe for Truitt Merle, MD.  ? ?I have reviewed the above documentation for accuracy and completeness, and I agree with the above. ?  ? ? ?

## 2022-04-07 ENCOUNTER — Telehealth: Payer: Self-pay

## 2022-04-07 NOTE — Telephone Encounter (Signed)
Pt called LVM stating she's on vacation in [place] and have had 3 nose bleeds.  Pt wants to know what should she do.  Notified Dr. Burr Medico since pt is taking Hydrea and is not in [place].

## 2022-04-21 ENCOUNTER — Other Ambulatory Visit: Payer: Self-pay

## 2022-05-01 DIAGNOSIS — I447 Left bundle-branch block, unspecified: Secondary | ICD-10-CM | POA: Diagnosis not present

## 2022-05-01 DIAGNOSIS — I6521 Occlusion and stenosis of right carotid artery: Secondary | ICD-10-CM | POA: Diagnosis not present

## 2022-05-01 DIAGNOSIS — I10 Essential (primary) hypertension: Secondary | ICD-10-CM | POA: Diagnosis not present

## 2022-05-01 DIAGNOSIS — I361 Nonrheumatic tricuspid (valve) insufficiency: Secondary | ICD-10-CM | POA: Diagnosis not present

## 2022-05-01 DIAGNOSIS — Z79899 Other long term (current) drug therapy: Secondary | ICD-10-CM | POA: Diagnosis not present

## 2022-05-01 DIAGNOSIS — R42 Dizziness and giddiness: Secondary | ICD-10-CM | POA: Diagnosis not present

## 2022-05-01 DIAGNOSIS — Z7982 Long term (current) use of aspirin: Secondary | ICD-10-CM | POA: Diagnosis not present

## 2022-05-01 DIAGNOSIS — R519 Headache, unspecified: Secondary | ICD-10-CM | POA: Diagnosis not present

## 2022-05-01 DIAGNOSIS — D473 Essential (hemorrhagic) thrombocythemia: Secondary | ICD-10-CM | POA: Diagnosis not present

## 2022-05-01 DIAGNOSIS — I6782 Cerebral ischemia: Secondary | ICD-10-CM | POA: Diagnosis not present

## 2022-05-01 DIAGNOSIS — R29818 Other symptoms and signs involving the nervous system: Secondary | ICD-10-CM | POA: Diagnosis not present

## 2022-05-02 DIAGNOSIS — D473 Essential (hemorrhagic) thrombocythemia: Secondary | ICD-10-CM | POA: Diagnosis not present

## 2022-05-02 DIAGNOSIS — R42 Dizziness and giddiness: Secondary | ICD-10-CM | POA: Diagnosis not present

## 2022-05-02 DIAGNOSIS — I10 Essential (primary) hypertension: Secondary | ICD-10-CM | POA: Diagnosis not present

## 2022-05-11 DIAGNOSIS — R42 Dizziness and giddiness: Secondary | ICD-10-CM | POA: Diagnosis not present

## 2022-05-11 DIAGNOSIS — I447 Left bundle-branch block, unspecified: Secondary | ICD-10-CM | POA: Diagnosis not present

## 2022-05-15 ENCOUNTER — Other Ambulatory Visit: Payer: Self-pay | Admitting: Hematology

## 2022-05-15 DIAGNOSIS — I447 Left bundle-branch block, unspecified: Secondary | ICD-10-CM | POA: Diagnosis not present

## 2022-05-15 DIAGNOSIS — I5189 Other ill-defined heart diseases: Secondary | ICD-10-CM | POA: Diagnosis not present

## 2022-05-15 DIAGNOSIS — R0609 Other forms of dyspnea: Secondary | ICD-10-CM | POA: Diagnosis not present

## 2022-05-15 DIAGNOSIS — R42 Dizziness and giddiness: Secondary | ICD-10-CM | POA: Diagnosis not present

## 2022-05-15 DIAGNOSIS — I10 Essential (primary) hypertension: Secondary | ICD-10-CM | POA: Diagnosis not present

## 2022-05-15 DIAGNOSIS — E669 Obesity, unspecified: Secondary | ICD-10-CM | POA: Diagnosis not present

## 2022-05-15 DIAGNOSIS — D473 Essential (hemorrhagic) thrombocythemia: Secondary | ICD-10-CM

## 2022-05-15 DIAGNOSIS — E7849 Other hyperlipidemia: Secondary | ICD-10-CM | POA: Diagnosis not present

## 2022-05-26 DIAGNOSIS — I499 Cardiac arrhythmia, unspecified: Secondary | ICD-10-CM | POA: Diagnosis not present

## 2022-05-26 DIAGNOSIS — I5032 Chronic diastolic (congestive) heart failure: Secondary | ICD-10-CM | POA: Diagnosis not present

## 2022-05-26 DIAGNOSIS — R0989 Other specified symptoms and signs involving the circulatory and respiratory systems: Secondary | ICD-10-CM | POA: Diagnosis not present

## 2022-05-26 DIAGNOSIS — I110 Hypertensive heart disease with heart failure: Secondary | ICD-10-CM | POA: Diagnosis not present

## 2022-05-26 DIAGNOSIS — I447 Left bundle-branch block, unspecified: Secondary | ICD-10-CM | POA: Diagnosis not present

## 2022-05-26 DIAGNOSIS — R7303 Prediabetes: Secondary | ICD-10-CM | POA: Diagnosis not present

## 2022-05-26 DIAGNOSIS — M542 Cervicalgia: Secondary | ICD-10-CM | POA: Diagnosis not present

## 2022-05-26 DIAGNOSIS — I10 Essential (primary) hypertension: Secondary | ICD-10-CM | POA: Diagnosis not present

## 2022-05-26 DIAGNOSIS — I208 Other forms of angina pectoris: Secondary | ICD-10-CM | POA: Diagnosis not present

## 2022-05-26 DIAGNOSIS — D473 Essential (hemorrhagic) thrombocythemia: Secondary | ICD-10-CM | POA: Diagnosis not present

## 2022-05-26 DIAGNOSIS — I281 Aneurysm of pulmonary artery: Secondary | ICD-10-CM | POA: Diagnosis not present

## 2022-05-26 DIAGNOSIS — R0789 Other chest pain: Secondary | ICD-10-CM | POA: Diagnosis not present

## 2022-05-26 DIAGNOSIS — R079 Chest pain, unspecified: Secondary | ICD-10-CM | POA: Diagnosis not present

## 2022-05-26 DIAGNOSIS — I259 Chronic ischemic heart disease, unspecified: Secondary | ICD-10-CM | POA: Diagnosis not present

## 2022-05-26 DIAGNOSIS — R778 Other specified abnormalities of plasma proteins: Secondary | ICD-10-CM | POA: Diagnosis not present

## 2022-05-27 DIAGNOSIS — R079 Chest pain, unspecified: Secondary | ICD-10-CM | POA: Diagnosis not present

## 2022-05-29 DIAGNOSIS — R42 Dizziness and giddiness: Secondary | ICD-10-CM | POA: Diagnosis not present

## 2022-05-29 DIAGNOSIS — I447 Left bundle-branch block, unspecified: Secondary | ICD-10-CM | POA: Diagnosis not present

## 2022-05-31 DIAGNOSIS — I10 Essential (primary) hypertension: Secondary | ICD-10-CM | POA: Diagnosis not present

## 2022-05-31 DIAGNOSIS — D473 Essential (hemorrhagic) thrombocythemia: Secondary | ICD-10-CM | POA: Diagnosis not present

## 2022-06-05 DIAGNOSIS — I10 Essential (primary) hypertension: Secondary | ICD-10-CM | POA: Diagnosis not present

## 2022-06-05 DIAGNOSIS — R42 Dizziness and giddiness: Secondary | ICD-10-CM | POA: Diagnosis not present

## 2022-06-15 DIAGNOSIS — I10 Essential (primary) hypertension: Secondary | ICD-10-CM | POA: Diagnosis not present

## 2022-06-15 DIAGNOSIS — D473 Essential (hemorrhagic) thrombocythemia: Secondary | ICD-10-CM | POA: Diagnosis not present

## 2022-06-15 DIAGNOSIS — L97929 Non-pressure chronic ulcer of unspecified part of left lower leg with unspecified severity: Secondary | ICD-10-CM | POA: Diagnosis not present

## 2022-06-15 DIAGNOSIS — I87312 Chronic venous hypertension (idiopathic) with ulcer of left lower extremity: Secondary | ICD-10-CM | POA: Diagnosis not present

## 2022-06-15 DIAGNOSIS — R140 Abdominal distension (gaseous): Secondary | ICD-10-CM | POA: Diagnosis not present

## 2022-06-16 DIAGNOSIS — H8110 Benign paroxysmal vertigo, unspecified ear: Secondary | ICD-10-CM | POA: Diagnosis not present

## 2022-06-16 DIAGNOSIS — R42 Dizziness and giddiness: Secondary | ICD-10-CM | POA: Diagnosis not present

## 2022-06-16 DIAGNOSIS — H903 Sensorineural hearing loss, bilateral: Secondary | ICD-10-CM | POA: Diagnosis not present

## 2022-06-23 DIAGNOSIS — R42 Dizziness and giddiness: Secondary | ICD-10-CM | POA: Diagnosis not present

## 2022-06-23 DIAGNOSIS — I447 Left bundle-branch block, unspecified: Secondary | ICD-10-CM | POA: Diagnosis not present

## 2022-06-28 DIAGNOSIS — D473 Essential (hemorrhagic) thrombocythemia: Secondary | ICD-10-CM | POA: Diagnosis not present

## 2022-06-28 DIAGNOSIS — R140 Abdominal distension (gaseous): Secondary | ICD-10-CM | POA: Diagnosis not present

## 2022-06-28 DIAGNOSIS — R0602 Shortness of breath: Secondary | ICD-10-CM | POA: Diagnosis not present

## 2022-06-28 DIAGNOSIS — I10 Essential (primary) hypertension: Secondary | ICD-10-CM | POA: Diagnosis not present

## 2022-07-04 DIAGNOSIS — R140 Abdominal distension (gaseous): Secondary | ICD-10-CM | POA: Diagnosis not present

## 2022-07-06 ENCOUNTER — Telehealth: Payer: Self-pay | Admitting: Hematology

## 2022-07-06 NOTE — Telephone Encounter (Signed)
Called to r/s per 8/17 in basket, pt stated they have a new MD and do not want to r/s

## 2022-07-17 ENCOUNTER — Other Ambulatory Visit: Payer: Medicare HMO

## 2022-07-17 ENCOUNTER — Ambulatory Visit: Payer: Medicare HMO | Admitting: Hematology

## 2022-07-20 DIAGNOSIS — I10 Essential (primary) hypertension: Secondary | ICD-10-CM | POA: Diagnosis not present

## 2022-07-20 DIAGNOSIS — D473 Essential (hemorrhagic) thrombocythemia: Secondary | ICD-10-CM | POA: Diagnosis not present

## 2022-07-26 DIAGNOSIS — I10 Essential (primary) hypertension: Secondary | ICD-10-CM | POA: Diagnosis not present

## 2022-07-26 DIAGNOSIS — M25472 Effusion, left ankle: Secondary | ICD-10-CM | POA: Diagnosis not present

## 2022-07-26 DIAGNOSIS — M25471 Effusion, right ankle: Secondary | ICD-10-CM | POA: Diagnosis not present

## 2022-07-26 DIAGNOSIS — R42 Dizziness and giddiness: Secondary | ICD-10-CM | POA: Diagnosis not present

## 2022-07-28 DIAGNOSIS — H8110 Benign paroxysmal vertigo, unspecified ear: Secondary | ICD-10-CM | POA: Diagnosis not present

## 2022-08-03 DIAGNOSIS — H524 Presbyopia: Secondary | ICD-10-CM | POA: Diagnosis not present

## 2022-08-03 DIAGNOSIS — Z13820 Encounter for screening for osteoporosis: Secondary | ICD-10-CM | POA: Diagnosis not present

## 2022-08-03 DIAGNOSIS — D84821 Immunodeficiency due to drugs: Secondary | ICD-10-CM | POA: Diagnosis not present

## 2022-08-03 DIAGNOSIS — I10 Essential (primary) hypertension: Secondary | ICD-10-CM | POA: Diagnosis not present

## 2022-08-03 DIAGNOSIS — H52203 Unspecified astigmatism, bilateral: Secondary | ICD-10-CM | POA: Diagnosis not present

## 2022-08-03 DIAGNOSIS — Z9181 History of falling: Secondary | ICD-10-CM | POA: Diagnosis not present

## 2022-08-03 DIAGNOSIS — Z79899 Other long term (current) drug therapy: Secondary | ICD-10-CM | POA: Diagnosis not present

## 2022-08-03 DIAGNOSIS — Z0000 Encounter for general adult medical examination without abnormal findings: Secondary | ICD-10-CM | POA: Diagnosis not present

## 2022-08-03 DIAGNOSIS — H2513 Age-related nuclear cataract, bilateral: Secondary | ICD-10-CM | POA: Diagnosis not present

## 2022-08-03 DIAGNOSIS — Z780 Asymptomatic menopausal state: Secondary | ICD-10-CM | POA: Diagnosis not present

## 2022-08-03 DIAGNOSIS — H25013 Cortical age-related cataract, bilateral: Secondary | ICD-10-CM | POA: Diagnosis not present

## 2022-08-03 DIAGNOSIS — Z23 Encounter for immunization: Secondary | ICD-10-CM | POA: Diagnosis not present

## 2022-08-08 DIAGNOSIS — R0602 Shortness of breath: Secondary | ICD-10-CM | POA: Diagnosis not present

## 2022-08-08 DIAGNOSIS — I10 Essential (primary) hypertension: Secondary | ICD-10-CM | POA: Diagnosis not present

## 2022-08-15 DIAGNOSIS — Z780 Asymptomatic menopausal state: Secondary | ICD-10-CM | POA: Diagnosis not present

## 2022-08-15 DIAGNOSIS — Z13820 Encounter for screening for osteoporosis: Secondary | ICD-10-CM | POA: Diagnosis not present

## 2022-08-15 DIAGNOSIS — M8589 Other specified disorders of bone density and structure, multiple sites: Secondary | ICD-10-CM | POA: Diagnosis not present
